# Patient Record
Sex: Female | Born: 1935 | Race: White | Hispanic: No | Smoking: Former smoker
Health system: Southern US, Community
[De-identification: ages and names within clinical notes are randomized; demographics above are authoritative.]

## PROBLEM LIST (undated history)

## (undated) DIAGNOSIS — I350 Nonrheumatic aortic (valve) stenosis: Secondary | ICD-10-CM

## (undated) DIAGNOSIS — I351 Nonrheumatic aortic (valve) insufficiency: Secondary | ICD-10-CM

## (undated) DIAGNOSIS — J9610 Chronic respiratory failure, unspecified whether with hypoxia or hypercapnia: Secondary | ICD-10-CM

## (undated) DIAGNOSIS — M1990 Unspecified osteoarthritis, unspecified site: Secondary | ICD-10-CM

## (undated) DIAGNOSIS — I491 Atrial premature depolarization: Secondary | ICD-10-CM

## (undated) DIAGNOSIS — T8859XA Other complications of anesthesia, initial encounter: Secondary | ICD-10-CM

## (undated) DIAGNOSIS — Z8719 Personal history of other diseases of the digestive system: Secondary | ICD-10-CM

## (undated) DIAGNOSIS — T4145XA Adverse effect of unspecified anesthetic, initial encounter: Secondary | ICD-10-CM

## (undated) DIAGNOSIS — K5790 Diverticulosis of intestine, part unspecified, without perforation or abscess without bleeding: Secondary | ICD-10-CM

## (undated) DIAGNOSIS — I5032 Chronic diastolic (congestive) heart failure: Secondary | ICD-10-CM

## (undated) DIAGNOSIS — E079 Disorder of thyroid, unspecified: Secondary | ICD-10-CM

## (undated) DIAGNOSIS — Z8619 Personal history of other infectious and parasitic diseases: Secondary | ICD-10-CM

## (undated) DIAGNOSIS — K219 Gastro-esophageal reflux disease without esophagitis: Secondary | ICD-10-CM

## (undated) DIAGNOSIS — K2970 Gastritis, unspecified, without bleeding: Secondary | ICD-10-CM

## (undated) DIAGNOSIS — I77810 Thoracic aortic ectasia: Secondary | ICD-10-CM

## (undated) DIAGNOSIS — D860 Sarcoidosis of lung: Secondary | ICD-10-CM

## (undated) DIAGNOSIS — D869 Sarcoidosis, unspecified: Secondary | ICD-10-CM

## (undated) DIAGNOSIS — J45909 Unspecified asthma, uncomplicated: Secondary | ICD-10-CM

## (undated) DIAGNOSIS — I252 Old myocardial infarction: Secondary | ICD-10-CM

## (undated) DIAGNOSIS — I340 Nonrheumatic mitral (valve) insufficiency: Secondary | ICD-10-CM

## (undated) DIAGNOSIS — M810 Age-related osteoporosis without current pathological fracture: Secondary | ICD-10-CM

## (undated) DIAGNOSIS — R002 Palpitations: Secondary | ICD-10-CM

## (undated) DIAGNOSIS — K449 Diaphragmatic hernia without obstruction or gangrene: Secondary | ICD-10-CM

## (undated) DIAGNOSIS — I272 Pulmonary hypertension, unspecified: Secondary | ICD-10-CM

## (undated) DIAGNOSIS — B9681 Helicobacter pylori [H. pylori] as the cause of diseases classified elsewhere: Secondary | ICD-10-CM

## (undated) DIAGNOSIS — J449 Chronic obstructive pulmonary disease, unspecified: Secondary | ICD-10-CM

## (undated) DIAGNOSIS — Z9289 Personal history of other medical treatment: Secondary | ICD-10-CM

## (undated) DIAGNOSIS — K225 Diverticulum of esophagus, acquired: Secondary | ICD-10-CM

## (undated) DIAGNOSIS — I480 Paroxysmal atrial fibrillation: Secondary | ICD-10-CM

## (undated) HISTORY — DX: Diverticulosis of intestine, part unspecified, without perforation or abscess without bleeding: K57.90

## (undated) HISTORY — DX: Personal history of other infectious and parasitic diseases: Z86.19

## (undated) HISTORY — DX: Gastritis, unspecified, without bleeding: K29.70

## (undated) HISTORY — PX: HERNIA REPAIR: SHX51

## (undated) HISTORY — DX: Sarcoidosis of lung: D86.0

## (undated) HISTORY — PX: COLONOSCOPY: SHX5424

## (undated) HISTORY — DX: Paroxysmal atrial fibrillation: I48.0

## (undated) HISTORY — DX: Personal history of other medical treatment: Z92.89

## (undated) HISTORY — DX: Unspecified asthma, uncomplicated: J45.909

## (undated) HISTORY — DX: Diaphragmatic hernia without obstruction or gangrene: K44.9

## (undated) HISTORY — DX: Helicobacter pylori (H. pylori) as the cause of diseases classified elsewhere: B96.81

## (undated) HISTORY — DX: Palpitations: R00.2

## (undated) HISTORY — DX: Unspecified osteoarthritis, unspecified site: M19.90

## (undated) HISTORY — DX: Personal history of other diseases of the digestive system: Z87.19

## (undated) HISTORY — DX: Chronic diastolic (congestive) heart failure: I50.32

## (undated) HISTORY — DX: Nonrheumatic aortic (valve) insufficiency: I35.1

## (undated) HISTORY — PX: CATARACT EXTRACTION: SUR2

## (undated) HISTORY — DX: Diverticulum of esophagus, acquired: K22.5

## (undated) HISTORY — DX: Gastro-esophageal reflux disease without esophagitis: K21.9

## (undated) HISTORY — PX: THYROIDECTOMY: SHX17

## (undated) HISTORY — DX: Atrial premature depolarization: I49.1

## (undated) HISTORY — PX: LEG SURGERY: SHX1003

## (undated) HISTORY — DX: Thoracic aortic ectasia: I77.810

---

## 1996-04-24 HISTORY — PX: CHOLECYSTECTOMY: SHX55

## 1997-10-24 HISTORY — PX: ABDOMINAL HYSTERECTOMY: SUR658

## 1998-01-24 HISTORY — PX: OTHER SURGICAL HISTORY: SHX169

## 1998-12-16 ENCOUNTER — Inpatient Hospital Stay (HOSPITAL_COMMUNITY): Admission: RE | Admit: 1998-12-16 | Discharge: 1998-12-18 | Payer: Self-pay | Admitting: Cardiovascular Disease

## 1998-12-17 ENCOUNTER — Encounter: Payer: Self-pay | Admitting: Cardiology

## 2006-12-14 ENCOUNTER — Ambulatory Visit: Payer: Self-pay | Admitting: Cardiology

## 2007-01-04 ENCOUNTER — Encounter: Payer: Self-pay | Admitting: Cardiology

## 2007-01-04 ENCOUNTER — Ambulatory Visit: Payer: Self-pay | Admitting: Cardiology

## 2008-03-12 ENCOUNTER — Ambulatory Visit: Payer: Self-pay | Admitting: Cardiology

## 2008-09-01 LAB — PULMONARY FUNCTION TEST

## 2008-11-13 ENCOUNTER — Encounter: Payer: Self-pay | Admitting: Cardiology

## 2009-01-26 DIAGNOSIS — I4892 Unspecified atrial flutter: Secondary | ICD-10-CM

## 2009-01-26 DIAGNOSIS — R002 Palpitations: Secondary | ICD-10-CM

## 2009-01-26 DIAGNOSIS — I080 Rheumatic disorders of both mitral and aortic valves: Secondary | ICD-10-CM

## 2009-01-27 ENCOUNTER — Encounter: Payer: Self-pay | Admitting: Cardiology

## 2009-02-23 ENCOUNTER — Ambulatory Visit: Payer: Self-pay | Admitting: Cardiology

## 2009-02-23 DIAGNOSIS — R42 Dizziness and giddiness: Secondary | ICD-10-CM

## 2010-02-25 NOTE — Letter (Signed)
Summary: Appointment -missed  [facility] at [facility] [address]   [place], [place] [address]   Phone: [phone]  Fax: [phone]     January 27, 2009 MRN: [id number]     [facility] [address] [place], [place]  [address]     Dear Ms. Muniz,  Our records indicate you missed your appointment on January 27, 2009                        with Dr. Andee Lineman.   It is very important that we reach you to reschedule this appointment. We look forward to participating in your health care needs.   Please contact us at the number listed above at your earliest convenience to reschedule this appointment.   Sincerely,    Glass blower/designer

## 2010-02-25 NOTE — Assessment & Plan Note (Signed)
Summary: 6 mo fu r/sfrom missed appt  Medications Added PROVENTIL HFA 108 (90 BASE) MCG/ACT AERS (ALBUTEROL SULFATE) use as directed      Allergies Added: ! ASPIRIN ! * NARCOTICS ! PCN  Visit Type:  Follow-up Primary Provider:  [name]  CC:  follow-up visit.  History of Present Illness: the patient is a 77 year old female with history of thyroidectomy and sarcoidosis.  Previously she had a negative ischemia workup.  She has a history of partial abdominal pain which has improved with dicyclomine.  She reports no chest pain shortness of breath orthopnea PND.  She had a negative Cardiolite in 2000.  She does not report any cardiovascular symptoms.  She denies chest pain orthopnea PND.  She does report occasional dizzy spells consistent with vertigo.  She feels she has an inner ear problem.  Clinical Review Panels:  Echocardiogram Echocardiogram Transthoracic Echocardiogram            Conclusions:         1. Global left ventricular wall motion and contractility are within         normal limits.         2. The estimated ejection fraction is 60-65%.          3. The right ventricle is slightly dilated.                              Electronically signed at 01/04/2007 18:07:52 by: Zackery Barefoot, M.D. (01/04/2007)    Preventive Screening-Counseling & Management  Alcohol-Tobacco     Smoking Status: quit     Year Quit: 1970  Current Medications (verified): 1)  Synthroid 112 Mcg Tabs (Levothyroxine Sodium) .... Take 1 Tablet By Mouth Once A Day 2)  Zantac 150 Mg Caps (Ranitidine Hcl) .... Take 1 Tablet By Mouth Two Times A Day 3)  Dicyclomine Hcl 20 Mg Tabs (Dicyclomine Hcl) .... Take 1 Tablet By Mouth Three Times A Day As Needed 4)  Proventil Hfa 108 (90 Base) Mcg/act Aers (Albuterol Sulfate) .... Use As Directed  Allergies (verified): 1)  ! Aspirin 2)  ! * Narcotics 3)  ! Pcn  Comments:  Nurse/Medical Assistant: The patient's medications and allergies were reviewed with  the patient and were updated in the Medication and Allergy Lists. Verbally gave names.  Past History:  Past Medical History: Last updated: February 24, 2009 Palpitations, resolved. Abdominal pain, likely spastic colon.  Normal colonoscopy.  History of lung sarcoidosis. Negative Cardiolite in 2000.   Family History: Last updated: Feb 24, 2009 Father: died from a stroke age 67 sister died and two other sisters with ? Problems with heart valves. Brother died with lung cancer and heart attack  Social History: Last updated: 24-Feb-2009 Tobacco Use - No.  Alcohol Use - no Drug Use - no Patient lives in [place]  Risk Factors: Smoking Status: quit (02/23/2009)  Social History: Smoking Status:  quit  Review of Systems       The patient complains of dizziness.  The patient denies fatigue, malaise, fever, weight gain/loss, vision loss, decreased hearing, hoarseness, chest pain, palpitations, shortness of breath, prolonged cough, wheezing, sleep apnea, coughing up blood, abdominal pain, blood in stool, nausea, vomiting, diarrhea, heartburn, incontinence, blood in urine, muscle weakness, joint pain, leg swelling, rash, skin lesions, headache, fainting, depression, anxiety, enlarged lymph nodes, easy bruising or bleeding, and environmental allergies.    Vital Signs:  Patient profile:   77 year old female Height:  64 inches Weight:      160 pounds BMI:     27.56 Pulse rate:   68 / minute BP sitting:   122 / 78  (left arm) Cuff size:   regular  Vitals Entered By: Carlye Grippe (February 23, 2009 10:08 AM)  Nutrition Counseling: Patient's BMI is greater than 25 and therefore counseled on weight management options. CC: follow-up visit   Physical Exam  Additional Exam:  General: Well-developed, well-nourished in no distress head: Normocephalic and atraumatic eyes PERRLA/EOMI intact, conjunctiva and lids normal nose: No deformity or lesions mouth normal dentition, normal posterior  pharynx neck: Supple, no JVD.  No masses, thyromegaly or abnormal cervical nodes lungs: Normal breath sounds bilaterally without wheezing.  Normal percussion heart: regular rate and rhythm with normal S1 and S2, no S3 or S4.  PMI is normal.  No pathological murmurs abdomen: Normal bowel sounds, abdomen is soft and nontender without masses, organomegaly or hernias noted.  No hepatosplenomegaly musculoskeletal: Back normal, normal gait muscle strength and tone normal pulsus: Pulse is normal in all 4 extremities Extremities:Non- pitting edema with varicosities.  neurologic: Alert and oriented x 3 skin: Intact without lesions or rashes cervical nodes: No significant adenopathy psychologic: Normal affect    EKG  Procedure date:  02/23/2009  Findings:      normal sinus rhythm with first degree AV block.  Left axis deviation nonspecific ST-T wave changes.  Impression & Recommendations:  Problem # 1:  PALPITATIONS (ICD-785.1) the patient reports no recurrent palpitations.  She can continue her current medical regimen.  Problem # 2:  ATRIAL FLUTTER (ICD-427.32) no evidence of atrial arrhythmia by ECG. Orders: EKG w/ Interpretation (93000)  Problem # 3:  INTERMITTENT VERTIGO (ICD-780.4) the patient will be referred for an ENT assessment by Dr. Andrey Campanile.  Patient Instructions: 1)  Your physician recommends that you continue on your current medications as directed. Please refer to the Current Medication list given to you today. 2)  Follow up in  1 year.

## 2010-03-04 ENCOUNTER — Ambulatory Visit (INDEPENDENT_AMBULATORY_CARE_PROVIDER_SITE_OTHER): Payer: Medicare PPO | Admitting: Cardiology

## 2010-03-04 ENCOUNTER — Encounter: Payer: Self-pay | Admitting: Cardiology

## 2010-03-04 DIAGNOSIS — R002 Palpitations: Secondary | ICD-10-CM

## 2010-03-11 NOTE — Assessment & Plan Note (Signed)
Summary: 1 YR FU/SRS  Medications Added CALTRATE 600+D 600-400 MG-UNIT TABS (CALCIUM CARBONATE-VITAMIN D) Take 1 tablet by mouth two times a day MULTIVITAMINS  TABS (MULTIPLE VITAMIN) Take 1 tablet by mouth once a day      Allergies Added:   Visit Type:  Follow-up Primary Provider:  [name]   History of Present Illness: the patient is a 77 year old female with a history of thyroidectomy and sarcoidosis.  the patient had a prior Cardiolite study which was negative for ischemia.  She does have a history of abdominal pain which has improved with dicyclomine.  She had a negative Cardiolite study in 2000.she presents for follow-up today.  She has been well.  She reports no chest pain palpitations or syncope.  Preventive Screening-Counseling & Management  Alcohol-Tobacco     Smoking Status: quit     Year Quit: 170  Current Medications (verified): 1)  Synthroid 112 Mcg Tabs (Levothyroxine Sodium) .... Take 1 Tablet By Mouth Once A Day 2)  Zantac 150 Mg Caps (Ranitidine Hcl) .... Take 1 Tablet By Mouth Two Times A Day 3)  Dicyclomine Hcl 20 Mg Tabs (Dicyclomine Hcl) .... Take 1 Tablet By Mouth Three Times A Day As Needed 4)  Proventil Hfa 108 (90 Base) Mcg/act Aers (Albuterol Sulfate) .... Use As Directed 5)  Caltrate 600+d 600-400 Mg-Unit Tabs (Calcium Carbonate-Vitamin D) .... Take 1 Tablet By Mouth Two Times A Day 6)  Multivitamins  Tabs (Multiple Vitamin) .... Take 1 Tablet By Mouth Once A Day  Allergies (verified): 1)  ! Aspirin 2)  ! * Narcotics 3)  ! Pcn  Comments:  Nurse/Medical Assistant: The patient's medications and allergies were verbally reviewed with the patient and were updated in the Medication and Allergy Lists.  Past History:  Past Medical History: Last updated: 01-Feb-2009 Palpitations, resolved. Abdominal pain, likely spastic colon.  Normal colonoscopy.  History of lung sarcoidosis. Negative Cardiolite in 2000.   Family History: Last updated:  2009-02-01 Father: died from a stroke age 44 sister died and two other sisters with ? Problems with heart valves. Brother died with lung cancer and heart attack  Social History: Last updated: February 01, 2009 Tobacco Use - No.  Alcohol Use - no Drug Use - no Patient lives in [place]  Risk Factors: Smoking Status: quit (03/04/2010)  Review of Systems  The patient denies fatigue, malaise, fever, weight gain/loss, vision loss, decreased hearing, hoarseness, chest pain, palpitations, shortness of breath, prolonged cough, wheezing, sleep apnea, coughing up blood, abdominal pain, blood in stool, nausea, vomiting, diarrhea, heartburn, incontinence, blood in urine, muscle weakness, joint pain, leg swelling, rash, skin lesions, headache, fainting, dizziness, depression, anxiety, enlarged lymph nodes, easy bruising or bleeding, and environmental allergies.    Vital Signs:  Patient profile:   77 year old female Height:      64 inches Weight:      170 pounds BMI:     29.29 Pulse rate:   67 / minute BP sitting:   166 / 86  (left arm) Cuff size:   regular  Vitals Entered By: Carlye Grippe (March 04, 2010 2:14 PM)  Nutrition Counseling: Patient's BMI is greater than 25 and therefore counseled on weight management options.  Serial Vital Signs/Assessments:  Time      Position  BP       Pulse  Resp  Temp     By 2:16 PM             163/75   71  Carlye Grippe   Physical Exam  Additional Exam:  General: Well-developed, well-nourished in no distress head: Normocephalic and atraumatic eyes PERRLA/EOMI intact, conjunctiva and lids normal nose: No deformity or lesions mouth normal dentition, normal posterior pharynx neck: Supple, no JVD.  No masses, thyromegaly or abnormal cervical nodes lungs: Normal breath sounds bilaterally without wheezing.  Normal percussion heart: regular rate and rhythm with normal S1 and S2, no S3 or S4.  PMI is normal.  No pathological  murmurs abdomen: Normal bowel sounds, abdomen is soft and nontender without masses, organomegaly or hernias noted.  No hepatosplenomegaly musculoskeletal: Back normal, normal gait muscle strength and tone normal pulsus: Pulse is normal in all 4 extremities Extremities:Non- pitting edema with varicosities.  neurologic: Alert and oriented x 3 skin: Intact without lesions or rashes cervical nodes: No significant adenopathy psychologic: Normal affect    Echocardiogram  Procedure date:  03/04/2010  Findings:       GE Vscan limited ECHO study was performed there was normal LV function.  Normal LV size as well as normal RV size and function.  Left atrial was within normal limits.  There was no significant left ventricular hypertrophy aortic valve is mildly calcified but there was normal cusp excursion there was trace to mild aortic insufficiency, mitral leaflets were thickened and there was mild mitral regurgitation.  EKG  Procedure date:  03/04/2010  Findings:      sinus rhythm with first degree AV block.  Left axis deviation premature ventricular contractions.  Heart rate 67 beats/min  Impression & Recommendations:  Problem # 1:  PALPITATIONS (ICD-785.1) the patient has no recurrent palpitations.  She has been doing well.  She remains under a lot of social stress.  She seems to be doing well however  Problem # 2:  ATRIAL FLUTTER (ICD-427.32) is no evidence of arrhythmia.  EKG is within normal limits with no acute changes. Orders: EKG w/ Interpretation (93000)  Problem # 3:  MITRAL VALVE INSUFF&AORTIC VALVE INSUFF (ICD-396.3) no evidence of significant valvular disease. GE Vscan limited ECHO study was performed at the bedside.  Results are as noted above.    Patient Instructions: 1)  Your physician recommends that you continue on your current medications as directed. Please refer to the Current Medication list given to you today. 2)  Follow up in  1 year

## 2010-06-08 NOTE — Assessment & Plan Note (Signed)
[facility]                          [facility] OFFICE NOTE   NAME:CHESHIRESheryle, Vice                       MRN:          [id number]  DATE:03/12/2008                            DOB:          Jul 09, 1928    HISTORY OF PRESENT ILLNESS:  The patient is a very pleasant 77 year old  female with a history of thyroidectomy and sarcoidosis.  She also has  hypothyroidism and is on thyroid replacement therapy.  She had a  negative Cardiolite in 2000.  She reports no chest pain, shortness of  breath, orthopnea, or PND.  An echocardiogram done last year also showed  normal LV function and no evidence of cardiac sarcoid.   From a cardiovascular standpoint, the patient is actually doing quite  well.  Her main problem, however, is that she has been suffering  abdominal pain for the last 3 months.  She states that this happens  almost on a daily basis.  There is no bloating associated with, no  constipation or diarrhea, but the pain can be quite severe.  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  This has caused some anxiety as well as difficulty sleeping.  This is  also around the same time when her abdominal symptoms started.  She did  have a colonoscopy done by Dr. Cleotis Nipper few months ago which was within  normal limits.   MEDICATIONS:  1. Flovent 44 mcg 2 puffs b.i.d.  2. Synthroid 0.112 mg p.o. daily.  3. Advair 1 puff daily.  4. Zantac 150 mg p.o. b.i.d.   PHYSICAL EXAMINATION:  VITAL SIGNS:  Blood pressure 146/89, the patient  reassures me that her home blood pressures actually are normal.  Heart  rate 68, weight 189 pounds.  NECK:  Normal carotid stroke and no carotid bruits.  LUNGS:  Clear breath sounds bilaterally.  HEART:  Regular rate and rhythm.  Normal S1 and S2.  No murmur, rubs, or  gallops.  ABDOMEN:  Soft, nontender.  No rebound or  guarding.  Good bowel sounds.  EXTREMITIES:  No cyanosis, clubbing, or edema.  NEUROLOGIC:  The patient is alert, oriented, and grossly nonfocal.   PROBLEM LIST:  1. Palpitations, resolved.  2. Abdominal pain, likely spastic colon.  3. Normal colonoscopy.  4. History of lung sarcoidosis.  5. Negative Cardiolite in 2000.   PLAN:  1. From a cardiovascular standpoint, the patient is doing quite well.      She did point out to me that she had some swelling in the lower      extremities, but she clearly has no pitting edema, and this appears      to be secondary to venous insufficiency.  2. The patient does appear to be under some stress and anxiety because      of her losses she suffered in the stock market.  I do think her      abdominal pain is associated with anxiety component.  In the  meanwhile, while she is awaiting an evaluation by Dr. Karilyn Cota, I      actually gave the patient a prescription of dicyclomine 20 mg p.o.      t.i.d. to be used p.r.n. for abdominal pain.  3. The patient can follow up with Dr. Maryellen Pile and Dr. Karilyn Cota regarding      possibilities and consideration for antianxiety medications if so      indicated.     Learta Codding, MD,FACC  Electronically Signed    GED/MedQ  DD: 03/12/2008  DT: 03/12/2008  Job #: [id number]   cc:   [name]

## 2010-06-08 NOTE — Assessment & Plan Note (Signed)
**Kari [name] De-Identified via Obfuscation** [facility]                          Kari Kari [name]   NAME:CHESHIREIkia, Kari Kari [name]                       MRN:          [id number]  DATE:12/14/2006                            DOB:          April 27, 1928    This is a new patient Kari [name].   REASON FOR CONSULTATION:  Evaluation of palpitations.   HISTORY OF PRESENT ILLNESS:  The patient is a 77 year old female with a  history of prior palpitations and substernal chest pain.  The patient  was evaluated by our group in 2000, when she underwent a stress test  which was reportedly within normal limits.  The patient in 2003, also  underwent an evaluation which I suspect was a [employer] with questionable  heart failure.  The patient now is seen due to a history of  approximately one month ago of palpitations.  Interestingly, this  occurred shortly after she was prescribed Advair Diskus when she saw a  physician at [facility] for her underlying lung sarcoidosis.  The patient  in the interim has stopped this medication and has experienced no  further palpitations.  She also reports no substernal chest pain,  shortness of breath, orthopnea, PND.  The patient is rather active.  She  does report chronic lower extremity edema of a non-pitting variety.  The  patient has a history of sarcoidosis and has been treated in the past  with steroids.  She is followed at [facility] for this.  She reports no  exertional chest pain or exertional dyspnea.  Her EKG in the office  today is essentially within normal limits.   PAST MEDICAL HISTORY:  1. History of thyroidectomy.  2. Hysterectomy.  3. Hernia repair.  4. Cholecystectomy.  5. Tonsillectomy.  6. History of sarcoidosis.  7. History of hypothyroidism.   ALLERGIES:  No known drug allergies.   MEDICATIONS:  1. Synthroid 112 mcg p.o. daily.  2. Flovent inhaler 2 puffs b.i.d.   SOCIAL HISTORY:  The patient lives in [place].  She does not smoke or  drink.  She teaches bible  study.   FAMILY HISTORY:  Mother is alive and has atrial fibrillation, she is 60  years old.  Father died from a stroke at age 9.  One sister is deceased  and two other sisters with questionable problems with heart valves.  She has a brother who had lung cancer and a heart attack.   REVIEW OF SYSTEMS:  As per HPI, no nausea, vomiting, no fever, chills,  no melena, hematochezia, dysuria, frequency, no recurrent palpitations  or syncope, and no myalgias or arthralgias.  The remainder of the review  of systems is within normal limits.   PHYSICAL EXAMINATION:  VITAL SIGNS:  Blood pressure is 133/70, heart  rate is 88 beats per minute, weight is 179 pounds.  NECK:  Normal carotid upstroke and no carotid bruits.  LUNGS:  Clear breath sounds bilaterally.  HEART:  Regular rate and rhythm.  Normal S1 S2.  No murmur, rub, or  gallop.  ABDOMEN:  Soft, nontender.  No rebound or guarding.  Good bowel sounds.  EXTREMITIES:  No  cyanosis or clubbing.  There is peripheral edema but  non-pitting.  NEUROLOGIC:  The patient is alert, oriented, and grossly nonfocal.   EKG:  Normal sinus rhythm with no acute ischemic changes.   PROBLEM LIST:  1. Palpitations, resolved.      a.     Post Advair use.      b.     No prior history of arrhythmias.      c.     Rule out cardiac sarcoid.  2. History of lung sarcoidosis.  3. History of chest pain with negative Cardiolite in 2000.   PLAN:  1. I do not think the patient needs a CardioNet monitor at the present      time as her palpitations have resolved after Advair Diskus use.      The patient __________  inhaled steroid agent.  2. Given her history of sarcoidosis, I will proceed with an      echocardiographic study to make sure the patient does not have      cardiac sarcoid or cardiomyopathy associated with this diagnosis.  3. The patient can follow up with [place] in 6 months.  I have not      prescribed any medications presently.  We will await the results  of      the echocardiographic study.     Learta Codding, MD,FACC  Electronically Signed    GED/MedQ  DD: 12/15/2006  DT: 12/15/2006  Job #: [id number]   cc:   [name]

## 2010-06-11 NOTE — Discharge Summary (Signed)
[place]. [facility]  Patient:    Kari [name]                        MRN: [id number] Adm. Date:  [id number] Disc. Date: 12/18/98 Attending:  Colon Branch Dictator:   Delton See, P.A. CC:         [facility], [address], [place], [place]             [place]. Maryellen Pile, M.D., [address] [place], [place]  2             Nathen May, M.D. [facility]                           Discharge Summary  HISTORY ON ADMISSION:  Kari [name] is a very pleasant 77 year old female who was seen in the office on December 16, 1998 by Dr. Corinda Gubler following a syncopal episode.  She apparently was accompanying her uncle to an appointment.  She stated she felt like she needed to get some air.  She stood up to leave the room; she collapsed on the floor.  The episode was witnessed by Arlana Hove, C.M.A. The patient apparently did not lose consciousness.  Her skin was warm and dry.  She  denied chest pain.  She complained of some nausea.  Blood pressure was 140/96, pulse 96.  The patient denies any history of diabetes.  She appeared somewhat dazed.  It was not clear at that time whether she had ever had syncopal episodes in the past.  An EKG was obtained and the patient denied any injury.  She was admitted to the hospital for further evaluation.  PAST MEDICAL HISTORY:  The patient is status post tonsillectomy, thyroidectomy,  cholecystectomy, hysterectomy, hernia repair x 2, history of right leg fracture, history of osteoporosis, history of asthma.  There is no history of hypertension, diabetes or tobacco use.  There is a positive family history for coronary artery disease.  MEDICATIONS PRIOR TO ADMISSION:  Mysoline spray, estrogen patch, Synthroid, Flovent and Proventil metered-dose inhalers.  HOSPITAL COURSE:  As noted, this patient was admitted to [facility] after having a syncopal episode in the office.  A 2-D echo  was performed on December 16, 1998; this showed normal left ventricular size and contraction. There was delayed relaxation.  There was mild left atrial enlargement, moderate mitral regurgitation, mild AI and mild TR.  A spiral CT scan was performed that was negative for pulmonary embolus.  On December 18, 1998, the patient had an exercise Cardiolite stress test; the patient exercised for a total of three minutes.  She reached her target heart rate at two minutes into the study.  Her heart rate was 130 beats per minute; the target was 129 beats per minute.  The patient did develop chest tightness and shortness of breath during exercise.  At three minutes, her heart rate was 136.  She was unable to go any further; however, this completed the study.  She did have ST segment depression in leads V3 through V6 of 1.5 to 2.0 mm, which was downsloping. This persisted into recovery and eventually resolved.  The final Cardiolite images showed normal wall motion with an EF of 70% with inferior attenuation, likely diaphragmatic.  No reversible ischemia.  This was interpreted as a negative study.  The situation was discussed with Gerrit Friends. Dietrich Pates, M.D. and  arrangements were made to discharge the patient with followup with Nathen May, M.D. in [place], [facility].  DISCHARGE MEDICATIONS:  The patient was told to continue her same medications she was on at time of admission; please see list as noted above.  ACTIVITY:  Activity was to be as tolerated.  The patient was told not to drive until cleared by her cardiologist.  DIET:  She was to be on her regular diet, which was a low-cholesterol diet.  FOLLOWUP:  She was told to call the [facility] on Monday to schedule an event recorder and an appointment with Dr. Graciela Husbands in approximately five weeks. he was to follow up with Cornell Barman. Maryellen Pile, M.D. as scheduled or as needed.  PROBLEM LIST AT TIME OF DISCHARGE:  1. Syncope with  negative cardiac enzymes.  2. Negative spiral CT for pulmonary embolus.  3. Exercise Cardiolite, December 18, 1998, with electrocardiogram changes but     negative scan; ejection fraction 70%.  4. Two-dimensional echocardiogram, December 16, 1998, revealing mild-to-moderate     valvular abnormalities, as noted above, with mild left atrial enlargement.  5. Positive family history of coronary artery disease.  6. Status post multiple surgeries.  7. Strong history of anxiety and panic attacks.  8. History of osteoporosis.  9. History of asthma. 10. Hypothyroidism, treated.  ADDENDUM:  A CBC on December 16, 1998 was within normal limits.  Cardiac enzymes were negative.  A PT and PTT were within normal limits.  A comprehensive metabolic panel was within normal limits.  A urinalysis was negative.  A TSH was 3.054. DD:  12/18/98 TD:  12/18/98 Job: [id number]

## 2010-06-11 NOTE — Discharge Summary (Signed)
[place]. [facility]  Patient:    Kari [name]                        MRN: [id number] Adm. Date:  [id number] Disc. Date: 12/18/98 Attending:  Colon Branch Dictator:   Delton See, P.A.                           Discharge Summary  ADDENDUM:  It was noted that the patient does have a history of an IODINE allergy. She was given prednisone, Benadryl, and cimetidine prophylaxis prior to her spiral CT scan with contrast.  Apparently she had no significant reaction to the study, although it should be noted that she is allergic to IODINE. DD:  12/18/98 TD:  12/18/98 Job: [id number]

## 2011-08-23 ENCOUNTER — Telehealth: Payer: Self-pay | Admitting: Cardiology

## 2011-08-23 NOTE — Telephone Encounter (Signed)
Spoke with patient and she will see Michelle L. PA tomorrow as scheduled. Patients telephone was not working well and it was going in and out, very difficult to have conversation.

## 2011-08-23 NOTE — Telephone Encounter (Signed)
New msg Pt called and said has rapid heart rate and is scheduled for Kari [name] tomorrow in [place]. She said she would prefer to see a MD instead. Please call her back

## 2011-08-24 ENCOUNTER — Ambulatory Visit: Payer: Medicare PPO | Admitting: Physician Assistant

## 2011-08-30 ENCOUNTER — Ambulatory Visit (INDEPENDENT_AMBULATORY_CARE_PROVIDER_SITE_OTHER): Payer: Medicare PPO | Admitting: Cardiovascular Disease

## 2011-08-30 ENCOUNTER — Encounter (INDEPENDENT_AMBULATORY_CARE_PROVIDER_SITE_OTHER): Payer: Medicare PPO

## 2011-08-30 ENCOUNTER — Encounter: Payer: Self-pay | Admitting: Cardiovascular Disease

## 2011-08-30 VITALS — BP 155/79 | HR 72 | Wt 181.0 lb

## 2011-08-30 DIAGNOSIS — I4892 Unspecified atrial flutter: Secondary | ICD-10-CM

## 2011-08-30 DIAGNOSIS — R002 Palpitations: Secondary | ICD-10-CM

## 2011-08-30 DIAGNOSIS — I440 Atrioventricular block, first degree: Secondary | ICD-10-CM | POA: Insufficient documentation

## 2011-08-30 DIAGNOSIS — I080 Rheumatic disorders of both mitral and aortic valves: Secondary | ICD-10-CM

## 2011-08-30 NOTE — Assessment & Plan Note (Signed)
PR was 218 in 2008 on ECG  Signs of SSS.  No evidence of high grade AV block  Yearly ECG

## 2011-08-30 NOTE — Progress Notes (Signed)
Patient ID: Kari [name], female   DOB: 19-Oct-1928, 78 y.o.   MRN: [id number] The patient is a 78 year-old female patient of Dr Degent with a history of thyroidectomy and sarcoidosis. . She does have a history of abdominal pain which has improved with dicyclomine. She had a negative Cardiolite study in 2000. She had an episode of ? PAF at eye doctor in July after having a bad reaction to some drops.  Last Tuesday had palpitaitons for 3-4 hours at grocery store.  Had weakness but no syncope No chest pain.  Still with occasional flip flops but no prolonged palpitations  ROS: Denies fever, malais, weight loss, blurry vision, decreased visual acuity, cough, sputum, SOB, hemoptysis, pleuritic pain, palpitaitons, heartburn, abdominal pain, melena, lower extremity edema, claudication, or rash.  All other systems reviewed and negative  General: Affect appropriate Healthy:  appears stated age HEENT: normal Neck supple with no adenopathy JVP normal no bruits no thyromegaly Lungs clear with no wheezing and good diaphragmatic motion Heart:  S1/S2 no murmur, no rub, gallop or click PMI normal Abdomen: benighn, BS positve, no tenderness, no AAA no bruit.  No HSM or HJR Distal pulses intact with no bruits No edema Neuro non-focal Skin warm and dry No muscular weakness   Current Outpatient Prescriptions  Medication Sig Dispense Refill  . Albuterol Sulfate (PROVENTIL HFA IN) Inhale 180 mcg into the lungs as directed.      . Calcium Carbonate-Vitamin D (CALTRATE 600+D) 600-400 MG-UNIT per tablet Take 1 tablet by mouth daily.      Marland Kitchen levothyroxine (SYNTHROID, LEVOTHROID) 112 MCG tablet Take 112 mcg by mouth daily.      . multivitamin (THERAGRAN) per tablet Take 1 tablet by mouth daily.      . ranitidine (ZANTAC) 150 MG tablet Take 150 mg by mouth as needed.         Allergies  Aspirin and Penicillins  Electrocardiogram:  NSR rate 72 PR 280 PAC  Assessment and Plan

## 2011-08-30 NOTE — Assessment & Plan Note (Signed)
Possbiel intermitant atrial arrhythmia.  Event monitor.  F/U with Dr Andee Lineman in [place].  Given porlonged PR would not empirically start anything unless flutter or fib seen on monitor.  No previous history of structural heart disease.

## 2011-08-30 NOTE — Assessment & Plan Note (Signed)
Echo 2008 ordered by GD with only mild MR and no prolapse No murmur on exam Observe

## 2011-08-30 NOTE — Patient Instructions (Signed)
Your physician recommends that you schedule a follow-up appointment in: 6-8 WEEKS WITH  DR Andee Lineman IN [facility] Your physician recommends that you continue on your current medications as directed. Please refer to the Current Medication list given to you today. Your physician has recommended that you wear an event monitor. Event monitors are medical devices that record the heart's electrical activity. Doctors most often [place] these monitors to diagnose arrhythmias. Arrhythmias are problems with the speed or rhythm of the heartbeat. The monitor is a small, portable device. You can wear one while you do your normal daily activities. This is usually used to diagnose what is causing palpitations/syncope (passing out).

## 2011-09-07 ENCOUNTER — Encounter: Payer: Self-pay | Admitting: Internal Medicine

## 2011-09-20 ENCOUNTER — Telehealth: Payer: Self-pay | Admitting: Pulmonary Disease

## 2011-09-20 NOTE — Telephone Encounter (Signed)
Called, spoke with pt.  I informed her of below.  She was seen by Dr. Kriste Basque for Pulmonary not PCP.  She already has a PCP.  She would like to know who Dr. Kriste Basque recs for a pulm dr for her in our office.  Pls advise.  ** She wanted to me thank Dr. Kriste Basque and give him her "best regards."

## 2011-09-20 NOTE — Telephone Encounter (Signed)
LMTCB

## 2011-09-20 NOTE — Telephone Encounter (Signed)
I spoke with pt and she stated she saw SN long time ago and wants to re-establish with him bc per pt "their is no one else like Dr. Kriste Basque". I do not see any visits in epic/emr. Please advise SN thanks

## 2011-09-20 NOTE — Telephone Encounter (Signed)
Per SN----so sorry---practice is slowing down and we are not taking on any new pts now.   We will be happy to help her find a primary care doctor that is close to her if she would like.  thanks

## 2011-09-20 NOTE — Telephone Encounter (Signed)
Per SN----best regards back to the pt.    Depends on the pulmonary problem and her primary care doctor will need to be the one to refer her to a pulmonary doctor. thanks

## 2011-09-21 NOTE — Telephone Encounter (Signed)
Called and spoke with patient informed her that per SN patient would need referral from PCP for a pulmonary doctor.  Patient verbalized understanding and stated that she would talk with her PCP regarding this.  Nothing further needed at this time.

## 2011-09-30 ENCOUNTER — Telehealth: Payer: Self-pay | Admitting: *Deleted

## 2011-09-30 NOTE — Telephone Encounter (Signed)
LMTCB RE MONITOR RE PER DR NISHAN NSR INFREQUENT PAC'S AND ARTIFACT./CY

## 2011-10-03 NOTE — Telephone Encounter (Signed)
PT  AWARE OF  MONITOR RESULTS ./CY 

## 2011-10-06 ENCOUNTER — Encounter: Payer: Self-pay | Admitting: Internal Medicine

## 2011-10-07 ENCOUNTER — Encounter: Payer: Self-pay | Admitting: Internal Medicine

## 2011-10-07 ENCOUNTER — Ambulatory Visit (INDEPENDENT_AMBULATORY_CARE_PROVIDER_SITE_OTHER): Payer: Medicare PPO | Admitting: Internal Medicine

## 2011-10-07 ENCOUNTER — Ambulatory Visit (INDEPENDENT_AMBULATORY_CARE_PROVIDER_SITE_OTHER): Payer: Medicare PPO | Admitting: Emergency Medicine

## 2011-10-07 ENCOUNTER — Encounter: Payer: Self-pay | Admitting: Emergency Medicine

## 2011-10-07 VITALS — BP 130/60 | HR 88 | Ht 62.75 in | Wt 179.5 lb

## 2011-10-07 VITALS — BP 130/70 | HR 71 | Temp 97.8°F | Ht 62.75 in | Wt 180.4 lb

## 2011-10-07 DIAGNOSIS — K219 Gastro-esophageal reflux disease without esophagitis: Secondary | ICD-10-CM

## 2011-10-07 DIAGNOSIS — D869 Sarcoidosis, unspecified: Secondary | ICD-10-CM

## 2011-10-07 DIAGNOSIS — D860 Sarcoidosis of lung: Secondary | ICD-10-CM

## 2011-10-07 DIAGNOSIS — K449 Diaphragmatic hernia without obstruction or gangrene: Secondary | ICD-10-CM | POA: Insufficient documentation

## 2011-10-07 DIAGNOSIS — R1013 Epigastric pain: Secondary | ICD-10-CM

## 2011-10-07 DIAGNOSIS — J449 Chronic obstructive pulmonary disease, unspecified: Secondary | ICD-10-CM | POA: Insufficient documentation

## 2011-10-07 DIAGNOSIS — J45909 Unspecified asthma, uncomplicated: Secondary | ICD-10-CM

## 2011-10-07 DIAGNOSIS — J99 Respiratory disorders in diseases classified elsewhere: Secondary | ICD-10-CM

## 2011-10-07 MED ORDER — PANTOPRAZOLE SODIUM 40 MG PO TBEC: 40.0000 mg | DELAYED_RELEASE_TABLET | Freq: Every day | ORAL | Status: DC

## 2011-10-07 MED ORDER — BUDESONIDE-FORMOTEROL FUMARATE 160-4.5 MCG/ACT IN AERO: 2.0000 | INHALATION_SPRAY | Freq: Two times a day (BID) | RESPIRATORY_TRACT | Status: DC

## 2011-10-07 NOTE — Progress Notes (Signed)
Subjective:    Patient ID: Kari [name], female    DOB: 1928-05-29, 78 y.o.   MRN: [id number]  HPI 78 yo woman, former smoker 20 pk-yrs, hx childhood asthma, allergic rhinitis, A Fib, hiatal hernia seen by Dr Rhea Belton. She is a lifelong asthmatic, started to bother her more in her in the last 10 years. She was dx with sarcoidosis after bx of her skin at the [facility]. She has been found to have pulmonary nodules, never biopsied. Her most recent CT scan of the chest was in [place], [place]. She has had PFT at [facility]. Her breathing has been stable, has been taking Advair but she believes it is causing her LE pain, MSK pain.  Uses proventil prn.    Review of Systems  Constitutional: Negative for fever, chills, diaphoresis, activity change, appetite change, fatigue and unexpected weight change.  HENT: Negative for hearing loss, ear pain, nosebleeds, congestion, sore throat, facial swelling, rhinorrhea, sneezing, mouth sores, trouble swallowing, neck pain, neck stiffness, dental problem, voice change, postnasal drip, sinus pressure, tinnitus and ear discharge.   Eyes: Negative for photophobia, discharge, itching and visual disturbance.  Respiratory: Positive for shortness of breath. Negative for apnea, cough, choking, chest tightness, wheezing and stridor.   Cardiovascular: Positive for palpitations and leg swelling. Negative for chest pain.  Gastrointestinal: Negative for nausea, vomiting, abdominal pain, constipation, blood in stool and abdominal distention.  Genitourinary: Negative for dysuria, urgency, frequency, hematuria, flank pain, decreased urine volume and difficulty urinating.  Musculoskeletal: Negative for myalgias, back pain, joint swelling, arthralgias and gait problem.  Skin: Negative for color change, pallor and rash.  Neurological: Negative for dizziness, tremors, seizures, syncope, speech difficulty, weakness, light-headedness, numbness and headaches.  Hematological: Negative for  adenopathy. Does not bruise/bleed easily.  Psychiatric/Behavioral: Negative for confusion, disturbed wake/sleep cycle and agitation. The patient is not nervous/anxious.     Past Medical History  Diagnosis Date  . Palpitations   . Abdominal pain   . Sarcoidosis, lung   . Atrial fibrillation   . Arthritis   . Asthma   . History of gallstones   . History of scarlet fever      Family History  Problem Relation Age of Onset  . Diabetes Daughter   . Heart disease Mother   . Diabetes Father   . Diabetes Brother   . Other Sister     scarlet fever  . Rheumatic fever Sister      History   Social History  . Marital Status: Divorced    Spouse Name: N/A    Number of Children: 4  . Years of Education: N/A   Occupational History  . clergy    Social History Main Topics  . Smoking status: Former Smoker -- 2.0 packs/day for 10 years    Types: Cigarettes, Pipe  . Smokeless tobacco: Never Used   Comment: pt unsure of exact month in 1977  . Alcohol Use: Yes     seldom...glass wine  . Drug Use: No  . Sexually Active: Not on file   Other Topics Concern  . Not on file   Social History Narrative  . No narrative on file     Allergies  Allergen Reactions  . Aspirin   . Other     Narcotics   . Penicillins      Outpatient Prescriptions Prior to Visit  Medication Sig Dispense Refill  . Albuterol Sulfate (PROVENTIL HFA IN) Inhale 180 mcg into the lungs as directed.      Marland Kitchen  Calcium Carbonate-Vitamin D (CALTRATE 600+D) 600-400 MG-UNIT per tablet Take 1 tablet by mouth daily.      . fluticasone-salmeterol (ADVAIR HFA) 115-21 MCG/ACT inhaler Inhale 2 puffs into the lungs 2 (two) times daily.      Marland Kitchen levothyroxine (SYNTHROID, LEVOTHROID) 112 MCG tablet Take 112 mcg by mouth daily.      . multivitamin (THERAGRAN) per tablet Take 1 tablet by mouth daily.      . ranitidine (ZANTAC) 150 MG tablet Take 150 mg by mouth as needed.       . pantoprazole (PROTONIX) 40 MG tablet Take 1 tablet  (40 mg total) by mouth daily.  30 tablet  2       Objective:   Physical Exam Filed Vitals:   10/07/11 1613  BP: 130/70  Pulse: 71  Temp: 97.8 F (36.6 C)   Gen: Pleasant, elderly woman, in no distress,  A bit tangential  ENT: No lesions,  mouth clear,  oropharynx clear, no postnasal drip  Neck: No JVD, no TMG, no carotid bruits  Lungs: No use of accessory muscles,   Cardiovascular: RRR, heart sounds normal, no murmur or gallops, no peripheral edema  Musculoskeletal: No deformities, no cyanosis or clubbing  Neuro: alert, non focal  Skin: Warm, no lesions or rashes      Assessment & Plan:  No problem-specific assessment & plan notes found for this encounter.

## 2011-10-07 NOTE — Assessment & Plan Note (Signed)
-   will obtain her CT scan from [facility] and [address]

## 2011-10-07 NOTE — Patient Instructions (Addendum)
Stop Advair Start Symbicort 2 puffs twice a day until next visit We will obtain your CT scans and your breathing tests from [place] and [place].  Follow with Dr Delton Coombes in 6 weeks

## 2011-10-07 NOTE — Patient Instructions (Addendum)
We have sent the following medications to your pharmacy for you to pick up at your convenience: protonix, take 1 capsule daily. Use Zantac for any breakthrough heart burn you experience.  Follow up with Dr. Rhea Belton in 6 weeks

## 2011-10-07 NOTE — Progress Notes (Signed)
Patient ID: Kari [name], female   DOB: 1928/05/09, 78 y.o.   MRN: [id number]  SUBJECTIVE: HPI Kari [name] is an 78 yo female with PMH of pulmonary and dermatologic sarcoidosis, asthma, osteoarthritis, history of gallstones status post cholecystectomy, reflux disease with hiatus hernia, and atrial arrhythmia with palpitations who is seen in consultation from Kari [name] for evaluation of epigastric abdominal pain and to establish care.  She is alone today. She reports a previous GI history with Kari [name], from [facility]. He reports a prior history of an upper endoscopy performed approximately 2 years ago she was told she has a hiatal hernia with gastric inflammation. She recalls being treated for some time with Nexium, but discontinued this due to irritability.  She is currently continuing to have epigastric abdominal discomfort and occasionally a burning in her lower chest and left upper quadrant. She is occasionally having nausea but no vomiting. She reports her appetite has been good and she denies early satiety. She's had no unintentional weight loss. She does note some anosmia.  Bowel habits have been regular without bleeding or melena. She denies dysphagia or odynophagia.  She reports using Advair recently and this has caused some lower extremity aching pain. Due to this pain she has started taking naproxen twice a day.  Review of Systems  As per history of present illness, otherwise negative   Past Medical History  Diagnosis Date  . Palpitations   . Abdominal pain   . Sarcoidosis, lung   . Atrial fibrillation   . Arthritis   . Asthma   . History of gallstones     Current Outpatient Prescriptions  Medication Sig Dispense Refill  . Albuterol Sulfate (PROVENTIL HFA IN) Inhale 180 mcg into the lungs as directed.      . Calcium Carbonate-Vitamin D (CALTRATE 600+D) 600-400 MG-UNIT per tablet Take 1 tablet by mouth daily.      . fluticasone-salmeterol (ADVAIR HFA) 115-21  MCG/ACT inhaler Inhale 2 puffs into the lungs 2 (two) times daily.      Marland Kitchen levothyroxine (SYNTHROID, LEVOTHROID) 112 MCG tablet Take 112 mcg by mouth daily.      . multivitamin (THERAGRAN) per tablet Take 1 tablet by mouth daily.      . ranitidine (ZANTAC) 150 MG tablet Take 150 mg by mouth as needed.       . pantoprazole (PROTONIX) 40 MG tablet Take 1 tablet (40 mg total) by mouth daily.  30 tablet  2    Allergies  Allergen Reactions  . Aspirin   . Other     Narcotics   . Penicillins     Family History  Problem Relation Age of Onset  . Diabetes Daughter   . Heart disease Mother   . Diabetes Father   . Diabetes Brother   . Other Sister     scarlet fever    History  Substance Use Topics  . Smoking status: Former Games developer  . Smokeless tobacco: Never Used  . 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  She is good friends with another patient of ours, Kari [name]  OBJECTIVE: BP 130/60  Pulse 88  Ht 5' 2.75" (1.594 m)  Wt 179 lb 8 oz (81.421 kg)  BMI 32.05 kg/m2 Constitutional: Well-developed and well-nourished. No distress. HEENT: Normocephalic and atraumatic. Oropharynx is clear and moist. No oropharyngeal exudate. Conjunctivae are normal. Pupils are equal round and reactive to light. No scleral icterus. Neck:  Neck supple. Trachea midline. Cardiovascular: Normal rate, regular rhythm and intact distal pulses. No M/R/G Pulmonary/chest: Effort normal and breath sounds normal. No wheezing, rales or rhonchi. Abdominal: Soft, nontender, nondistended. Bowel sounds active throughout. There are no masses palpable. No hepatosplenomegaly. Extremities: no clubbing, cyanosis, or edema Lymphadenopathy: No cervical adenopathy noted. Neurological: Alert and oriented to person place and time. Skin: Skin is warm and dry. No rashes noted. Psychiatric: Normal mood and affect. Behavior is normal.  Records EMR reviewed, awaiting  requested records from outside hospitals  ASSESSMENT AND PLAN: 78 yo female with PMH of pulmonary and dermatologic sarcoidosis, asthma, osteoarthritis, history of gallstones status post cholecystectomy, reflux disease with hiatus hernia, and atrial arrhythmia with palpitations who is seen in consultation from Kari [name] for evaluation of epigastric abdominal pain and to establish care.  1. Epigastric pain/GERD -- I will wait records from her previous GI physician which should shed light on what has been done in the past. It sounds like she continues to have reflux symptoms, and certainly could have ongoing gastritis given her use of daily NSAIDs. I recommended a trial of pantoprazole 40 mg daily. We discussed how this will likely replace her Zantac, but she can continue to use this for breakthrough symptoms. I've asked that she notify us immediately if this medication causes increased irritability. I've advised her to take this 30 minutes before breakfast. I will see her back in 4-6 weeks to assess her response to this medication, and if symptoms persist on PPI, we'll discuss proceeding with endoscopy. Hopefully, we will have her old records by this time for completeness.  2. History of sarcoidosis and asthma -- she is requested a pulmonary referral, and we will place one to [facility]

## 2011-10-07 NOTE — Assessment & Plan Note (Signed)
Benefits from Advair but trouble tolerating due to side effects.  - will try a change to symbicort. If unable to take then consider just an ICS + SABA prn - will get her spirometry from [facility] - rov 6 weeks

## 2011-11-11 ENCOUNTER — Ambulatory Visit (INDEPENDENT_AMBULATORY_CARE_PROVIDER_SITE_OTHER): Payer: Medicare PPO | Admitting: Internal Medicine

## 2011-11-11 VITALS — BP 124/65 | HR 78 | Wt 180.5 lb

## 2011-11-11 DIAGNOSIS — R002 Palpitations: Secondary | ICD-10-CM

## 2011-11-11 NOTE — Patient Instructions (Addendum)
Your physician wants you to follow-up in: 6 months with Lori Gerhardt, NP and 12 months with Dr Allred You will receive a reminder letter in the mail two months in advance. If you don't receive a letter, please call our office to schedule the follow-up appointment.  

## 2011-11-13 ENCOUNTER — Encounter: Payer: Self-pay | Admitting: Internal Medicine

## 2011-11-13 NOTE — Assessment & Plan Note (Signed)
Recent event monitor reveals pacs Though she carries a diagnosis of atrial fibrillation, this is not well documented  No changes today  Follow-up with Kari [name] in 6 months I will see again in a year

## 2011-11-13 NOTE — Progress Notes (Signed)
PCP: Kari Nations, MD  Kari [name] is a 78 y.o. female who presents today for routine cardiology followup.  She recently presented to establish care with Dr Eden Emms.  At that time, she reported palpitations.  She had an event monitor which revealed pacs but no afib.  She has done well since that time. Today, she denies symptoms of  chest pain, shortness of breath,  lower extremity edema, dizziness, presyncope, or syncope.  The patient is otherwise without complaint today.   Past Medical History  Diagnosis Date  . Palpitations   . Abdominal pain   . Sarcoidosis, lung   . Atrial fibrillation   . Arthritis   . Asthma   . History of gallstones   . History of scarlet fever   . Premature atrial contractions    Past Surgical History  Procedure Date  . Colonoscopy   . Cardiolite 2000  . Cholecystectomy 04/1996  . Abdominal hysterectomy 10/1997  . Hernia repair     inguinal and hiatal/abdominal  . Leg surgery     right leg...rod/screws    Current Outpatient Prescriptions  Medication Sig Dispense Refill  . Albuterol Sulfate (PROVENTIL HFA IN) Inhale 180 mcg into the lungs as directed.      . Calcium Carbonate-Vitamin D (CALTRATE 600+D) 600-400 MG-UNIT per tablet Take 1 tablet by mouth daily.      Marland Kitchen levothyroxine (SYNTHROID, LEVOTHROID) 112 MCG tablet Take 112 mcg by mouth daily.      . multivitamin (THERAGRAN) per tablet Take 1 tablet by mouth daily.      . pantoprazole (PROTONIX) 20 MG tablet Take 20 mg by mouth daily.        Physical Exam: Filed Vitals:   11/11/11 1448  BP: 124/65  Pulse: 78  Weight: 180 lb 8 oz (81.874 kg)    GEN- The patient is well appearing, alert and oriented x 3 today.   Head- normocephalic, atraumatic Eyes-  Sclera clear, conjunctiva pink Ears- hearing intact Oropharynx- clear Lungs- Clear to ausculation bilaterally, normal work of breathing Heart- Regular rate and rhythm, no murmurs, rubs or gallops, PMI not laterally displaced GI- soft, NT, ND, +  BS Extremities- no clubbing, cyanosis, or edema   Assessment and Plan:

## 2011-11-21 ENCOUNTER — Encounter: Payer: Self-pay | Admitting: Emergency Medicine

## 2011-11-21 ENCOUNTER — Ambulatory Visit (INDEPENDENT_AMBULATORY_CARE_PROVIDER_SITE_OTHER): Payer: Medicare PPO | Admitting: Emergency Medicine

## 2011-11-21 ENCOUNTER — Ambulatory Visit (INDEPENDENT_AMBULATORY_CARE_PROVIDER_SITE_OTHER)
Admission: RE | Admit: 2011-11-21 | Discharge: 2011-11-21 | Disposition: A | Discharge status: Home / Self Care | Payer: Medicare PPO | Source: Ambulatory Visit | Attending: Emergency Medicine | Admitting: Emergency Medicine

## 2011-11-21 VITALS — BP 120/76 | HR 80 | Temp 98.0°F | Ht 64.0 in | Wt 181.0 lb

## 2011-11-21 DIAGNOSIS — D869 Sarcoidosis, unspecified: Secondary | ICD-10-CM

## 2011-11-21 DIAGNOSIS — J45909 Unspecified asthma, uncomplicated: Secondary | ICD-10-CM

## 2011-11-21 DIAGNOSIS — D860 Sarcoidosis of lung: Secondary | ICD-10-CM

## 2011-11-21 DIAGNOSIS — J99 Respiratory disorders in diseases classified elsewhere: Secondary | ICD-10-CM

## 2011-11-21 MED ORDER — BECLOMETHASONE DIPROPIONATE 80 MCG/ACT IN AERS: 1.0000 | INHALATION_SPRAY | Freq: Two times a day (BID) | RESPIRATORY_TRACT | Status: DC

## 2011-11-21 MED ORDER — AEROCHAMBER MINI CHAMBER DEVI: 1.0000 | Freq: Every day | Status: DC

## 2011-11-21 NOTE — Assessment & Plan Note (Signed)
Side effects from LABA (Advair and Symbicort) - will try QVAR 1 puff bid + SABA prn - rov 2 months

## 2011-11-21 NOTE — Progress Notes (Signed)
  Subjective:    Patient ID: Kari [name], female    DOB: 1928/04/14, 78 y.o.   MRN: [id number]  HPI 78 yo woman, former smoker 20 pk-yrs, hx childhood asthma, allergic rhinitis, A Fib, hiatal hernia seen by Dr Rhea Belton. She is a lifelong asthmatic, started to bother her more in her in the last 10 years. She was dx with sarcoidosis after bx of her skin [facility]. She has been found to have pulmonary nodules, never biopsied. Her most recent CT scan of the chest was in [place], [place]. She has had PFT at [facility]. Her breathing has been stable, has been taking Advair but she believes it is causing her LE pain, MSK pain.  Uses proventil prn.   ROV 11/21/11 -- 83 with hx longstanding asthma and sarcoidosis dx by skin bx. Seen recently in cardiology clinic for palpitations - ? Hx A fib, but documented PVC's. We stopped Advair last visit due to side effects, changed to symbicort. She was unable to use the Symbicort due to visual changes. She reports that she has been having nasal congestion and cough for the last week - ? Allergies, no known sick contacts. She has been on doxycycline 1 week. She has been using SABA about 1-2x a day. Overall she believes her breathing is improved.   CT scan chest 10/14/09 [facility], [place]) >> hyperinflation, hiatal hernia, sub-cm pretracheal nodes, no obvious nodules.      Objective:   Physical Exam Filed Vitals:   11/21/11 1011  BP: 120/76  Pulse: 80  Temp: 98 F (36.7 C)   Gen: Pleasant, elderly woman, in no distress,  A bit tangential  ENT: No lesions,  mouth clear,  oropharynx clear, no postnasal drip  Neck: No JVD, no TMG, no carotid bruits  Lungs: No use of accessory muscles, no wheezes  Cardiovascular: RRR, heart sounds normal, no murmur or gallops, no peripheral edema  Musculoskeletal: No deformities, no cyanosis or clubbing  Neuro: alert, non focal  Skin: Warm, no lesions or rashes      Assessment & Plan:  Sarcoidosis of lung CT scan in  09/2009 without any significant nodular disease or LAD. Will defer repeat CT at this time, follow CXR - CXR today  Asthma Side effects from LABA (Advair and Symbicort) - will try QVAR 1 puff bid + SABA prn - rov 2 months

## 2011-11-21 NOTE — Assessment & Plan Note (Signed)
CT scan in 09/2009 without any significant nodular disease or LAD. Will defer repeat CT at this time, follow CXR - CXR today

## 2011-11-21 NOTE — Patient Instructions (Addendum)
Stop Symbicort  Start using QVAR , 1 puff twice a day Use your albuterol as needed through a spacer CXR today Keep track of how often you need to use your albuterol Follow with Dr Delton Coombes in 2 months to review your status

## 2011-11-25 DIAGNOSIS — K2970 Gastritis, unspecified, without bleeding: Secondary | ICD-10-CM

## 2011-11-25 DIAGNOSIS — B9681 Helicobacter pylori [H. pylori] as the cause of diseases classified elsewhere: Secondary | ICD-10-CM

## 2011-11-25 HISTORY — DX: Gastritis, unspecified, without bleeding: K29.70

## 2011-11-25 HISTORY — DX: Helicobacter pylori (H. pylori) as the cause of diseases classified elsewhere: B96.81

## 2011-11-28 ENCOUNTER — Encounter: Payer: Self-pay | Admitting: Internal Medicine

## 2011-11-29 ENCOUNTER — Ambulatory Visit (INDEPENDENT_AMBULATORY_CARE_PROVIDER_SITE_OTHER): Payer: Medicare PPO | Admitting: Internal Medicine

## 2011-11-29 ENCOUNTER — Other Ambulatory Visit (INDEPENDENT_AMBULATORY_CARE_PROVIDER_SITE_OTHER): Payer: Medicare PPO

## 2011-11-29 ENCOUNTER — Telehealth: Payer: Self-pay | Admitting: Gastroenterology

## 2011-11-29 ENCOUNTER — Encounter: Payer: Self-pay | Admitting: Internal Medicine

## 2011-11-29 VITALS — BP 128/64 | HR 91 | Ht 64.0 in | Wt 179.0 lb

## 2011-11-29 DIAGNOSIS — R110 Nausea: Secondary | ICD-10-CM

## 2011-11-29 DIAGNOSIS — R1013 Epigastric pain: Secondary | ICD-10-CM

## 2011-11-29 DIAGNOSIS — K5900 Constipation, unspecified: Secondary | ICD-10-CM

## 2011-11-29 DIAGNOSIS — R109 Unspecified abdominal pain: Secondary | ICD-10-CM

## 2011-11-29 LAB — COMPREHENSIVE METABOLIC PANEL
Albumin: 4 g/dL (ref 3.5–5.2)
BUN: 19 mg/dL (ref 6–23)
CO2: 28 mEq/L (ref 19–32)
Calcium: 9.1 mg/dL (ref 8.4–10.5)
Chloride: 96 mEq/L (ref 96–112)
Glucose, Bld: 92 mg/dL (ref 70–99)
Potassium: 4.5 mEq/L (ref 3.5–5.1)
Sodium: 131 mEq/L — ABNORMAL LOW (ref 135–145)
Total Protein: 7.1 g/dL (ref 6.0–8.3)

## 2011-11-29 LAB — CBC
MCV: 92.3 fl (ref 78.0–100.0)
Platelets: 236 10*3/uL (ref 150.0–400.0)
RBC: 4.04 Mil/uL (ref 3.87–5.11)
WBC: 3.8 10*3/uL — ABNORMAL LOW (ref 4.5–10.5)

## 2011-11-29 LAB — LIPASE: Lipase: 21 U/L (ref 11.0–59.0)

## 2011-11-29 MED ORDER — ALIGN PO CAPS: 1.0000 | ORAL_CAPSULE | Freq: Every day | ORAL | Status: DC

## 2011-11-29 NOTE — Patient Instructions (Addendum)
You have been scheduled for an endoscopy with propofol. Please follow written instructions given to you at your visit today. If you use inhalers (even only as needed) or a CPAP machine, please bring them with you on the day of your procedure.  Your physician has requested that you go to the basement for lab work before leaving today:  You have been given samples of Align, take one capsule daily

## 2011-11-29 NOTE — Progress Notes (Addendum)
Subjective:    Patient ID: Kari [name], female    DOB: [birth year], 78 y.o.   MRN: [id number]  HPI Ms. Majewski is an 78 yo female with PMH of pulmonary and dermatologic sarcoidosis, asthma, osteoarthritis, history of gallstones status post cholecystectomy, reflux disease with hiatus hernia, and atrial arrhythmia with palpitations who is seen in follow-up. I last saw her in September 2013 for evaluation of epigastric abdominal pain. We started her on pantoprazole 40 mg daily after that appointment. She did not get much benefit from this medication and in fact found it to be very constipating. She recently stopped this medicine due to to ongoing constipation that was no longer responding to milk of magnesia.  She has continued to have epigastric abdominal pain which is worse after eating. It usually occurs within 20 minutes after eating. She is avoiding certain foods, specifically spicy foods which seems to help a little. She continues to have episodes of nausea but not vomiting. She was on doxycycline for bronchitis which may the nausea worse, but this medication stopped about 3 days ago. She is not having vomiting. At times her pain is burning and other times fairly dull. No early satiety. She reports her weight has been stable. She had a regular bowel movement today without blood or melena. She denies heartburn, dysphagia or odynophagia, but does have a history of heartburn but states this is no longer an issue for her. She does report frequent bloating after meals.  She has a prior history of cholecystectomy. She also reports an EGD performed in [facility] about 2 years ago which revealed "gastritis and a hiatal hernia".  She took Nexium for some time but stopped due to irritability. She was also referred to a GI surgeon for evaluation of hiatal hernia repair, but she reports the surgeon did not feel this surgery was necessary for her. She recalls what sounds like a barium swallow and possibly chest  and abdominal imaging also performed at [facility].  Review of Systems As per history of present illness, otherwise negative except for dyspnea which has improved after doxycycline and initiation of beclomethasone inhaler  Current Medications, Allergies, Past Medical History, Past Surgical History, Family History and Social History were reviewed in [employer] record.     Objective:   Physical Exam BP 128/64  Pulse 91  Ht 5\' 4"  (1.626 m)  Wt 179 lb (81.194 kg)  BMI 30.73 kg/m2  SpO2 97% Constitutional: Well-developed and well-nourished. No distress. HEENT: Normocephalic and atraumatic. Oropharynx is clear and moist. No oropharyngeal exudate. Conjunctivae are normal.  No scleral icterus. Neck: Neck supple. Trachea midline. Cardiovascular: Normal rate, regular rhythm and intact distal pulses. No M/R/G Pulmonary/chest: Effort normal and breath sounds normal. No wheezing, rales or rhonchi. Abdominal: Soft, nontender, nondistended. Bowel sounds active throughout. There are no masses palpable. No hepatosplenomegaly. Extremities: no clubbing, cyanosis, or edema Lymphadenopathy: No cervical adenopathy noted. Neurological: Alert and oriented to person place and time. Skin: Skin is warm and dry. No rashes noted. Psychiatric: Normal mood and affect. Behavior is normal.  Labs -- pending today    Assessment & Plan:  78 yo female with PMH of pulmonary and dermatologic sarcoidosis, asthma, osteoarthritis, history of gallstones status post cholecystectomy, reflux disease with hiatus hernia, and atrial arrhythmia with palpitations who is seen in follow-up.  1.  Epigastric pain/nausea -- she did not have improvement of her upper GI symptoms with PPI, and she suffered from constipation as a result of this  medication. We discussed her constipation is not a common side effect of PPI, but she feels strongly this was the culprit. I will again request records from  [facility] to better understand the EGD performed there as well as the biopsies that were taken. At this point given the persistence of her symptoms, the lack of response to PPI, I recommended direct visualization with repeat EGD. We discussed this test including the risks and benefits and she is agreeable to proceed. We will discontinue the PPI for now, and make further decisions regarding acid suppression after endoscopy. I have recommended a trial of Align one capsule daily for her abdominal bloating. I will check labs today to include blood count, CMP, celiac panel and lipase.  Addendum --Records received from [facility] where she was seen by Gastroenterology with a chief complaint of abdominal pain. This record will be scanned into the EMR, and appears to have a concern for peptic ulcer disease and she was taken for upper endoscopy on 06/29/2010 which revealed a normal esophagus, a large 7 cm hiatal hernia and a normal duodenum. We started PPI therapy, recommend upper GI series to define anatomy (which after talking to the patient it sounds like she had), and surgical evaluation (which also sounds like she had) --We'll proceed as discussed above

## 2011-11-29 NOTE — Telephone Encounter (Signed)
Requested records for the 2nd time from [facility].

## 2011-11-30 LAB — TISSUE TRANSGLUTAMINASE, IGA: Tissue Transglutaminase Ab, IgA: 3.9 U/mL (ref <20)

## 2011-12-09 ENCOUNTER — Other Ambulatory Visit: Payer: Self-pay | Admitting: Gastroenterology

## 2011-12-09 DIAGNOSIS — R109 Unspecified abdominal pain: Secondary | ICD-10-CM

## 2011-12-09 MED ORDER — DIPHENHYDRAMINE HCL 50 MG PO CAPS: ORAL_CAPSULE | ORAL | Status: DC

## 2011-12-09 MED ORDER — ONDANSETRON 4 MG PO TBDP: 4.0000 mg | ORAL_TABLET | Freq: Three times a day (TID) | ORAL | Status: DC | PRN

## 2011-12-09 MED ORDER — PREDNISONE 50 MG PO TABS: ORAL_TABLET | ORAL | Status: DC

## 2011-12-13 ENCOUNTER — Telehealth: Payer: Self-pay | Admitting: *Deleted

## 2011-12-13 ENCOUNTER — Ambulatory Visit (INDEPENDENT_AMBULATORY_CARE_PROVIDER_SITE_OTHER)
Admission: RE | Admit: 2011-12-13 | Discharge: 2011-12-13 | Disposition: A | Discharge status: Home / Self Care | Payer: Medicare PPO | Source: Ambulatory Visit | Attending: Internal Medicine | Admitting: Internal Medicine

## 2011-12-13 DIAGNOSIS — R109 Unspecified abdominal pain: Secondary | ICD-10-CM

## 2011-12-13 MED ORDER — IOHEXOL 300 MG/ML  SOLN
100.0000 mL | Freq: Once | INTRAMUSCULAR | Status: AC | PRN
  Administered 2011-12-13: 100 mL via INTRAVENOUS

## 2011-12-13 NOTE — Telephone Encounter (Signed)
Informed pt of Dr Lauro Franklin findings and to continue with EGD; pt stated understanding.

## 2011-12-13 NOTE — Telephone Encounter (Signed)
Message copied by Florene Glen on Tue Dec 13, 2011  2:25 PM ------      Message from: [name]      Created: Tue Dec 13, 2011 12:38 PM       CT scan shows her large hiatal hernia but no other acute findings to explain her symptoms      We are proceeding to endoscopy, but the CT findings are reassuring

## 2011-12-15 ENCOUNTER — Encounter: Payer: Self-pay | Admitting: Internal Medicine

## 2011-12-15 ENCOUNTER — Ambulatory Visit (AMBULATORY_SURGERY_CENTER): Payer: Medicare PPO | Admitting: Internal Medicine

## 2011-12-15 ENCOUNTER — Other Ambulatory Visit (INDEPENDENT_AMBULATORY_CARE_PROVIDER_SITE_OTHER): Payer: Medicare PPO

## 2011-12-15 VITALS — BP 157/97 | HR 69 | Temp 97.5°F | Resp 21 | Ht 64.0 in | Wt 179.0 lb

## 2011-12-15 DIAGNOSIS — A048 Other specified bacterial intestinal infections: Secondary | ICD-10-CM

## 2011-12-15 DIAGNOSIS — R109 Unspecified abdominal pain: Secondary | ICD-10-CM

## 2011-12-15 DIAGNOSIS — K449 Diaphragmatic hernia without obstruction or gangrene: Secondary | ICD-10-CM

## 2011-12-15 DIAGNOSIS — K5900 Constipation, unspecified: Secondary | ICD-10-CM

## 2011-12-15 DIAGNOSIS — K253 Acute gastric ulcer without hemorrhage or perforation: Secondary | ICD-10-CM

## 2011-12-15 DIAGNOSIS — K2960 Other gastritis without bleeding: Secondary | ICD-10-CM

## 2011-12-15 LAB — URINALYSIS
Ketones, ur: NEGATIVE
Leukocytes, UA: NEGATIVE
Specific Gravity, Urine: 1.015 (ref 1.000–1.030)
pH: 8 (ref 5.0–8.0)

## 2011-12-15 LAB — BASIC METABOLIC PANEL
CO2: 31 mEq/L (ref 19–32)
GFR: 75.2 mL/min (ref >60.00)
Glucose, Bld: 108 mg/dL — ABNORMAL HIGH (ref 70–99)
Potassium: 4.1 mEq/L (ref 3.5–5.1)
Sodium: 134 mEq/L — ABNORMAL LOW (ref 135–145)

## 2011-12-15 MED ORDER — RANITIDINE HCL 150 MG PO TABS: 150.0000 mg | ORAL_TABLET | Freq: Two times a day (BID) | ORAL | Status: DC

## 2011-12-15 MED ORDER — SODIUM CHLORIDE 0.9 % IV SOLN: 500.0000 mL | INTRAVENOUS | Status: DC

## 2011-12-15 MED ORDER — SUCRALFATE 1 GM/10ML PO SUSP: 1.0000 g | Freq: Four times a day (QID) | ORAL | Status: DC

## 2011-12-15 NOTE — Progress Notes (Signed)
Patient did not experience any of the following events: a burn prior to discharge; a fall within the facility; wrong site/side/patient/procedure/implant event; or a hospital transfer or hospital admission upon discharge from the facility. (G8907) Patient did not have preoperative order for IV antibiotic SSI prophylaxis. (G8918)  

## 2011-12-15 NOTE — Op Note (Addendum)
[facility] [address] [place], [address]   ENDOSCOPY PROCEDURE REPORT  PATIENT: Kari, [name]  MR#: [id number] BIRTHDATE: [birth year] , 83  yrs. old GENDER: Female ENDOSCOPIST: [name], MD REFERRED BY: PROCEDURE DATE:  12/15/2011 PROCEDURE:  EGD w/ biopsy ASA CLASS:     Class III INDICATIONS:  Epigastric pain.   Nausea. MEDICATIONS: MAC sedation, administered by CRNA and propofol (Diprivan) 200mg  IV TOPICAL ANESTHETIC: none  DESCRIPTION OF PROCEDURE: After the risks benefits and alternatives of the procedure were thoroughly explained, informed consent was obtained.  The LB GIF-H180 [id number] endoscope was introduced through the mouth and advanced to the second portion of the duodenum. Without limitations.  The instrument was slowly withdrawn as the mucosa was fully examined.      ESOPHAGUS: A 5 cm hiatal hernia was noted.   The esophagus was otherwise normal.  STOMACH: Moderate erosive gastritis (inflammation) was found in the cardia, gastric body, and gastric fundus.  Multiple biopsies were performed using cold forceps. 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  DUODENUM: The duodenal mucosa showed no abnormalities in the bulb and second portion of the duodenum. Retroflexed views revealed a hiatal hernia as described previously. The scope was then withdrawn from the patient and the procedure completed.  COMPLICATIONS: There were no complications.  ENDOSCOPIC IMPRESSION: 1.   5 cm hiatal hernia 2.   The esophagus was otherwise normal. 3.   Erosive gastritis (inflammation) was found in the cardia, gastric body, and gastric fundus; multiple biopsies 4.   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 5.   The duodenal mucosa showed no abnormalities in the bulb and second portion of the duodenum  RECOMMENDATIONS: 1.  Await pathology results 2.  Trial of ranitidine 150 mg twice  daily (12 hours apart)  for acid suppression (previous intolerance to pantoprazole and esomeprazole) 3.  Sucralfate 1 g four times daily can be used for 1 week and longer if helpful 4.  Continue ondansetron 4 mg as directed and as needed for nausea  eSigned:  [name], MD 12/15/2011 11:15 AM Revised: 12/15/2011 11:15 AM  CC:The Patient and [name], MD  PATIENT NAME:  Kari, [name] MR#: [id number]

## 2011-12-15 NOTE — Progress Notes (Signed)
The pt tolerated the egd very well. Maw   

## 2011-12-15 NOTE — Patient Instructions (Addendum)
YOU HAD AN ENDOSCOPIC PROCEDURE TODAY AT THE [facility]: Refer to the procedure report that was given to you for any specific questions about what was found during the examination.  If the procedure report does not answer your questions, please call your gastroenterologist to clarify.  If you requested that your care partner not be given the details of your procedure findings, then the procedure report has been included in a sealed envelope for you to review at your convenience later.  YOU SHOULD EXPECT: Some feelings of bloating in the abdomen. Passage of more gas than usual.  Walking can help get rid of the air that was put into your GI tract during the procedure and reduce the bloating. If you had a lower endoscopy (such as a colonoscopy or flexible sigmoidoscopy) you may notice spotting of blood in your stool or on the toilet paper. If you underwent a bowel prep for your procedure, then you may not have a normal bowel movement for a few days.  DIET: Your first meal following the procedure should be a light meal and then it is ok to progress to your normal diet.  A half-sandwich or bowl of soup is an example of a good first meal.  Heavy or fried foods are harder to digest and may make you feel nauseous or bloated.  Likewise meals heavy in dairy and vegetables can cause extra gas to form and this can also increase the bloating.  Drink plenty of fluids but you should avoid alcoholic beverages for 24 hours.  ACTIVITY: Your care partner should take you home directly after the procedure.  You should plan to take it easy, moving slowly for the rest of the day.  You can resume normal activity the day after the procedure however you should NOT DRIVE or use heavy machinery for 24 hours (because of the sedation medicines used during the test).    SYMPTOMS TO REPORT IMMEDIATELY: A gastroenterologist can be reached at any hour.  During normal business hours, 8:30 AM to 5:00 PM Monday through Friday,  call [phone].  After hours and on weekends, please call the GI answering service at [phone] who will take a message and have the physician on call contact you.  Following upper endoscopy (EGD)  Vomiting of blood or coffee ground material  New chest pain or pain under the shoulder blades  Painful or persistently difficult swallowing  New shortness of breath  Fever of 100F or higher  Black, tarry-looking stools  FOLLOW UP: If any biopsies were taken you will be contacted by phone or by letter within the next 1-3 weeks.  Call your gastroenterologist if you have not heard about the biopsies in 3 weeks.  Our staff will call the home number listed on your records the next business day following your procedure to check on you and address any questions or concerns that you may have at that time regarding the information given to you following your procedure. This is a courtesy call and so if there is no answer at the home number and we have not heard from you through the emergency physician on call, we will assume that you have returned to your regular daily activities without incident.  SIGNATURES/CONFIDENTIALITY: You and/or your care partner have signed paperwork which will be entered into your electronic medical record.  These signatures attest to the fact that that the information above on your After Visit Summary has been reviewed and is understood.  Full responsibility of   the confidentiality of this discharge information lies with you and/or your care-partner. 

## 2011-12-16 ENCOUNTER — Telehealth: Payer: Self-pay | Admitting: *Deleted

## 2011-12-16 LAB — URINE CULTURE: Organism ID, Bacteria: NO GROWTH

## 2011-12-16 NOTE — Telephone Encounter (Signed)
No answer, message left for the patient. 

## 2011-12-21 ENCOUNTER — Telehealth: Payer: Self-pay | Admitting: *Deleted

## 2011-12-21 ENCOUNTER — Encounter: Payer: Self-pay | Admitting: Internal Medicine

## 2011-12-21 MED ORDER — LANSOPRAZOLE 30 MG PO CPDR: 30.0000 mg | DELAYED_RELEASE_CAPSULE | Freq: Two times a day (BID) | ORAL | Status: DC

## 2011-12-21 MED ORDER — BIS SUBCIT-METRONID-TETRACYC 140-125-125 MG PO CAPS: 3.0000 | ORAL_CAPSULE | Freq: Three times a day (TID) | ORAL | Status: DC

## 2011-12-21 NOTE — Telephone Encounter (Signed)
Message copied by Florene Glen on Wed Dec 21, 2011  2:27 PM ------      Message from: [name]      Created: Wed Dec 21, 2011 10:37 AM       H. pylori positive, please start Pylera with lansoprazole 30 mg twice a day as the PPI      Once she has completed antibody therapy she can go back to ranitidine 150 mg twice a day      H. pylori stool antigen in 8 weeks to confirm eradication

## 2011-12-21 NOTE — Telephone Encounter (Signed)
lmom for pt that I ordered something for H.Pylori and explained why, etc. She needs to take the Prevacid with the Pylera and I will call her Monday and explain everything to her; it may take a couple of days to get the med.

## 2011-12-30 ENCOUNTER — Telehealth: Payer: Self-pay | Admitting: Internal Medicine

## 2011-12-30 NOTE — Telephone Encounter (Signed)
Pt instructed on the need for Pylera and the Lansoprazole; I will mail her instructions and Dr Lauro Franklin notes. Pt stated understanding.

## 2012-01-23 ENCOUNTER — Encounter: Payer: Self-pay | Admitting: Emergency Medicine

## 2012-01-23 ENCOUNTER — Ambulatory Visit (INDEPENDENT_AMBULATORY_CARE_PROVIDER_SITE_OTHER): Payer: Medicare PPO | Admitting: Emergency Medicine

## 2012-01-23 VITALS — BP 150/88 | HR 83 | Temp 97.5°F | Ht 64.0 in | Wt 180.4 lb

## 2012-01-23 DIAGNOSIS — J45909 Unspecified asthma, uncomplicated: Secondary | ICD-10-CM

## 2012-01-23 NOTE — Progress Notes (Signed)
  Subjective:    Patient ID: Kari [name], female    DOB: March 02, 1928, 78 y.o.   MRN: [id number]  HPI 78 yo woman, former smoker 20 pk-yrs, hx childhood asthma, allergic rhinitis, A Fib, hiatal hernia seen by Dr Rhea Belton. She is a lifelong asthmatic, started to bother her more in her in the last 78 years. She was dx with sarcoidosis after bx of her skin at the [facility]. She has been found to have pulmonary nodules, never biopsied. Her most recent CT scan of the chest was in [place], [place]. She has had PFT at [facility]. Her breathing has been stable, has been taking Advair but she believes it is causing her LE pain, MSK pain.  Uses proventil prn.   ROV 11/21/11 -- 78 with hx longstanding asthma and sarcoidosis dx by skin bx. Seen recently in cardiology clinic for palpitations - ? Hx A fib, but documented PVC's. We stopped Advair last visit due to side effects, changed to symbicort. She was unable to use the Symbicort due to visual changes. She reports that she has been having nasal congestion and cough for the last week - ? Allergies, no known sick contacts. She has been on doxycycline 1 week. She has been using SABA about 1-2x a day. Overall she believes her breathing is improved.   CT scan chest 10/14/09 [facility], [place]) >> hyperinflation, hiatal hernia, sub-cm pretracheal nodes, no obvious nodules.   ROV 01/23/12 -- asthma and sarcoidosis dx by skin bx. Has been difficult for her to tolerate LABA/ICS. Last time we tried QVAR - she developed A Fib 2 weeks later and stopped it. Has been using SABA about 2 x a day usually, has been using 4x a day last few days. She states that she has been more SOB x 3 weeks. Has had some nasal congestion, some rare GERD sx from her hiatal hernia.       Objective:   Physical Exam Filed Vitals:   01/23/12 1340  BP: 150/88  Pulse: 83  Temp: 97.5 F (36.4 C)   Gen: Pleasant, elderly woman, in no distress,  Very tangential, difficult to take a hx.  ENT: No lesions,   mouth clear,  oropharynx clear, no postnasal drip  Neck: No JVD, no TMG, no carotid bruits  Lungs: No use of accessory muscles, no wheezes  Cardiovascular: RRR, heart sounds normal, no murmur or gallops, no peripheral edema  Musculoskeletal: No deformities, no cyanosis or clubbing  Neuro: alert, non focal  Skin: Warm, no lesions or rashes      Assessment & Plan:  Asthma Doubt that QVAR was responsible for the A fib she experienced, would like to retry it. Will decrease to 1 puff daily. Treat allergies, continue zantac  Please start loratadine 10mg  (Claritin) daily Restart your QVAR . Only use 1 puff once a day. Remember to rinse out your mouth after using Use albuterol 2 puffs if needed for your shortness of breath.  Follow with Dr Delton Coombes in 3 months or sooner if you have any problems

## 2012-01-23 NOTE — Patient Instructions (Addendum)
Please start loratadine 10mg  (Claritin) daily Restart your QVAR . Only use 1 puff once a day. Remember to rinse out your mouth after using Use albuterol 2 puffs if needed for your shortness of breath.  Follow with Dr Delton Coombes in 3 months or sooner if you have any problems.

## 2012-01-23 NOTE — Assessment & Plan Note (Signed)
Doubt that QVAR was responsible for the A fib she experienced, would like to retry it. Will decrease to 1 puff daily. Treat allergies, continue zantac  Please start loratadine 10mg  (Claritin) daily Restart your QVAR . Only use 1 puff once a day. Remember to rinse out your mouth after using Use albuterol 2 puffs if needed for your shortness of breath.  Follow with Dr Delton Coombes in 3 months or sooner if you have any problems

## 2012-02-27 ENCOUNTER — Telehealth: Payer: Self-pay | Admitting: *Deleted

## 2012-02-27 DIAGNOSIS — A048 Other specified bacterial intestinal infections: Secondary | ICD-10-CM

## 2012-02-27 NOTE — Telephone Encounter (Signed)
Message copied by Florene Glen on Mon Feb 27, 2012  8:31 AM ------      Message from: Florene Glen      Created: Fri Dec 30, 2011  9:43 AM       Call to schedule Hpylori stool antigen per path result note

## 2012-02-27 NOTE — Telephone Encounter (Signed)
Informed pt she needs to do a stool antigen test to ensure eradication of H. Pylori; pt stated understanding. She remains on Zantac bid and states she's a lot better. She reports pain at times when she swallows and in her stomach; states a lot of it could be d/t her HH. Pt would like to know what the procedure is to change Pulmonologists. The scheduler in Pulmonology states the present MD is asked about the change and then the new MD is asked.

## 2012-02-28 ENCOUNTER — Encounter: Payer: Self-pay | Admitting: Physician Assistant

## 2012-03-02 ENCOUNTER — Other Ambulatory Visit: Payer: Medicare PPO

## 2012-03-02 DIAGNOSIS — A048 Other specified bacterial intestinal infections: Secondary | ICD-10-CM

## 2012-03-03 LAB — HELICOBACTER PYLORI  SPECIAL ANTIGEN: H. PYLORI Antigen: NEGATIVE

## 2012-03-07 ENCOUNTER — Telehealth: Payer: Self-pay | Admitting: Emergency Medicine

## 2012-03-07 NOTE — Telephone Encounter (Signed)
I spoke with pt and she is requesting to switch from RB to [facility]. She states she didn't feel good with her last visits w/ RB. Pt aware of our protocol. Please advise RB thanks

## 2012-03-07 NOTE — Telephone Encounter (Signed)
Informed pt stool antigen was neg. She will call [facility] about changing MD.

## 2012-03-07 NOTE — Telephone Encounter (Signed)
This OK with me. Very nice lady. Please check with Dr Maple Hudson to make sure he agrees.

## 2012-03-12 NOTE — Telephone Encounter (Signed)
Dr. Maple Hudson, is it okay with you for the pt to switch to you?

## 2012-03-12 NOTE — Telephone Encounter (Signed)
Per CY-okay; he will accept this patient. Please put in next open consult slot so he may have time to review with patient. Thanks.

## 2012-03-12 NOTE — Telephone Encounter (Signed)
Spoke with pt and scheduled appt with CDY for 04/13/12 at 3:30 pm Nothing further needed per pt

## 2012-04-13 ENCOUNTER — Encounter: Payer: Self-pay | Admitting: Internal Medicine

## 2012-04-13 ENCOUNTER — Ambulatory Visit (INDEPENDENT_AMBULATORY_CARE_PROVIDER_SITE_OTHER): Payer: Medicare PPO | Admitting: Internal Medicine

## 2012-04-13 ENCOUNTER — Ambulatory Visit (INDEPENDENT_AMBULATORY_CARE_PROVIDER_SITE_OTHER)
Admission: RE | Admit: 2012-04-13 | Discharge: 2012-04-13 | Disposition: A | Discharge status: Home / Self Care | Payer: Medicare PPO | Source: Ambulatory Visit | Attending: Internal Medicine | Admitting: Internal Medicine

## 2012-04-13 VITALS — BP 124/60 | HR 87 | Ht 63.0 in | Wt 178.6 lb

## 2012-04-13 DIAGNOSIS — D869 Sarcoidosis, unspecified: Secondary | ICD-10-CM

## 2012-04-13 DIAGNOSIS — D860 Sarcoidosis of lung: Secondary | ICD-10-CM

## 2012-04-13 DIAGNOSIS — J45909 Unspecified asthma, uncomplicated: Secondary | ICD-10-CM

## 2012-04-13 DIAGNOSIS — J99 Respiratory disorders in diseases classified elsewhere: Secondary | ICD-10-CM

## 2012-04-13 NOTE — Progress Notes (Signed)
01/23/12- Dr Delton Coombes HPI 79 yo woman, former smoker 20 pk-yrs, hx childhood asthma, allergic rhinitis, A Fib, hiatal hernia seen by Dr Rhea Belton. She is a lifelong asthmatic, started to bother her more in her in the last 10 years. She was dx with sarcoidosis after bx of her skin at the [facility]. She has been found to have pulmonary nodules, never biopsied. Her most recent CT scan of the chest was in [place], [place]. She has had PFT at [facility]. Her breathing has been stable, has been taking Advair but she believes it is causing her LE pain, MSK pain.  Uses proventil prn.   ROV 11/21/11 -- 83 with hx longstanding asthma and sarcoidosis dx by skin bx. Seen recently in cardiology clinic for palpitations - ? Hx A fib, but documented PVC's. We stopped Advair last visit due to side effects, changed to symbicort. She was unable to use the Symbicort due to visual changes. She reports that she has been having nasal congestion and cough for the last week - ? Allergies, no known sick contacts. She has been on doxycycline 1 weekShe has been using SABA about 1-2x a day. Overall she believes her breathing is improved.  CT scan chest 10/14/09 [facility], [place]) >> hyperinflation, hiatal hernia, sub-cm pretracheal nodes, no obvious nodules.  ROV 01/23/12 -- asthma and sarcoidosis dx by skin bx. Has been difficult for her to tolerate LABA/ICS. Last time we tried QVAR - she developed A Fib 2 weeks later and stopped it. Has been using SABA about 2 x a day usually, has been using 4x a day last few days. She states that she has been more SOB x 3 weeks. Has had some nasal congestion, some rare GERD sx from her hiatal hernia.   04/13/12-  New Consult- Change from RB to CY at the patient's request. Here with friend.  23 yo woman, former smoker 20 pk-yrs, hx childhood asthma, allergic rhinitis, A Fib, hiatal hernia, sarcoid.   PCP Dr [name] in [place]. She gives a history that sarcoid was diagnosed many years ago by skin biopsy  presented with lung nodules and adenopathy. Nodules resolved after treatment with prednisone for 3 months. Never had lung biopsy. She has had repeated courses of prednisone for "allergy" in the 1980s. Now has osteoporosis. She had smoked one pack per day until stopping in 1975. She has had asthma since childhood, worse in the fall and spring. Advair and Symbicort caused myalgias in standard dose but she tolerates Symbicort 160 taking one puff twice daily. Today she feels well. Triggers include strong odors, virus colds and  seasonal changes. History of paroxysmal atrial fibrillation and hypertension. Her primary physician gave her Pneumovax.  Prior to Admission medications   Medication Sig Start Date End Date Taking? Authorizing Provider  Albuterol Sulfate (PROVENTIL HFA IN) Inhale 180 mcg into the lungs as directed.   Yes Historical Provider, MD  budesonide-formoterol (SYMBICORT) 160-4.5 MCG/ACT inhaler Inhale 1 puff into the lungs 2 (two) times daily.   Yes Historical Provider, MD  Calcium Carbonate-Vitamin D (CALTRATE 600+D) 600-400 MG-UNIT per tablet Take 1 tablet by mouth daily.   Yes Historical Provider, MD  hydrochlorothiazide (MICROZIDE) 12.5 MG capsule Take 1 capsule by mouth daily. 04/12/12  Yes Historical Provider, MD  levothyroxine (SYNTHROID, LEVOTHROID) 112 MCG tablet Take 112 mcg by mouth daily.   Yes Historical Provider, MD  multivitamin [facility]) per tablet Take 1 tablet by mouth daily.   Yes Historical Provider, MD  ranitidine (ZANTAC) 150 MG  tablet Take 1 tablet (150 mg total) by mouth 2 (two) times daily. 12/15/11  Yes [name], MD  Spacer/Aero-Holding Chambers (AEROCHAMBER MINI CHAMBER) DEVI 1 Container by Does not apply route daily. 11/21/11   Leslye Peer, MD   .pmh Past Surgical History  Procedure Laterality Date  . Colonoscopy    . Cardiolite  2000  . Cholecystectomy  04/1996  . Abdominal hysterectomy  10/1997  . Hernia repair      inguinal and  hiatal/abdominal  . Leg surgery      right leg...rod/screws  ' Family History  Problem Relation Age of Onset  . Diabetes Daughter   . Heart disease Mother   . Diabetes Father   . Diabetes Brother   . Other Sister     scarlet fever  . Rheumatic fever Sister   . Colon cancer Neg Hx    History   Social History  . Marital Status: Divorced    Spouse Name: N/A    Number of Children: 4  . Years of Education: N/A   Occupational History  . clergy    Social History Main Topics  . Smoking status: Former Smoker -- 2.00 packs/day for 10 years    Types: Cigarettes, Pipe  . Smokeless tobacco: Never Used     Comment: pt unsure of exact month in 1977  . Alcohol Use: Yes     Comment: seldom...glass wine  . Drug Use: No  . Sexually Active: Not on file   Other Topics Concern  . Not on file   Social History Narrative  . No narrative on file   ROS-see HPI Constitutional:   No-   weight loss, night sweats, fevers, chills, fatigue, lassitude. HEENT:   No-  headaches, difficulty swallowing, tooth/dental problems, sore throat,       No-  sneezing, itching, ear ache, nasal congestion, post nasal drip,  CV:  No-   chest pain, orthopnea, PND, +swelling in lower extremities, anasarca,                                  dizziness, + palpitations Resp: + shortness of breath with exertion or at rest.              No-   productive cough,  No non-productive cough,  No- coughing up of blood.              No-   change in color of mucus.  No- wheezing.   Skin: No-   rash or lesions. GI:  +heartburn, indigestion, abdominal pain, nausea, vomiting, diarrhea,                 change in bowel habits, loss of appetite GU: No-   dysuria, change in color of urine, no urgency or frequency.  No- flank pain. MS:  No-   joint pain or swelling.  No- decreased range of motion.  No- back pain. Neuro-     nothing unusual Psych:  No- change in mood or affect. No depression or anxiety.  No memory loss.  OBJ- Physical  Exam General- Alert, Oriented, Affect-appropriate, Distress- none acute Skin- rash-none, lesions- none, excoriation- none Lymphadenopathy- none Head- atraumatic            Eyes- Gross vision intact, PERRLA, conjunctivae and secretions clear            Ears- Hearing, canals-normal. TMs opacified  Nose- Clear, no-Septal dev, mucus, polyps, erosion, perforation             Throat- Mallampati II , mucosa clear , drainage- none, tonsils- atrophic Neck- flexible , trachea midline, no stridor , thyroid nl, carotid no bruit Chest - symmetrical excursion , unlabored           Heart/CV- RRR , no murmur , no gallop  , no rub, nl s1 s2                           - JVD- none , edema- none, stasis changes- none, varices- none           Lung- clear to P&A, wheeze- none, cough- none , dullness-none, rub- none           Chest wall-  Abd- tender-no, distended-no, bowel sounds-present, HSM- no Br/ Gen/ Rectal- Not done, not indicated Extrem- cyanosis- none, clubbing, none, atrophy- none, strength- nl Neuro- grossly intact to observation

## 2012-04-13 NOTE — Patient Instructions (Addendum)
Order- CXR   Dx asthma, hx of sarcoid              schedule PFT  Ok to try using Symbicort 160    1 puff then rise mouth, twice daily  Ok to use the Proventil HFA albuterol inhaler  1 or 2 puffs every 6 hours if needed

## 2012-04-20 NOTE — Assessment & Plan Note (Signed)
Sarcoid is probably in- active in long-term remission. Plan-update chest x-ray

## 2012-04-20 NOTE — Assessment & Plan Note (Signed)
Probable diagnosis is asthma with COPD. She gives a lifelong history of asthma and seasonal variation and irritant triggers. She smoked one pack per day until 1975. Plan-okay to try one puff with Symbicort twice daily for long-term control since she has that medication now. Schedule PFT and chest x-ray. Encouraged to remain active.

## 2012-04-20 NOTE — Progress Notes (Signed)
Quick Note:  Pt aware of results. ______ 

## 2012-05-25 ENCOUNTER — Encounter: Payer: Self-pay | Admitting: Internal Medicine

## 2012-05-25 ENCOUNTER — Ambulatory Visit (INDEPENDENT_AMBULATORY_CARE_PROVIDER_SITE_OTHER): Payer: Medicare PPO | Admitting: Internal Medicine

## 2012-05-25 ENCOUNTER — Ambulatory Visit: Payer: Medicare PPO

## 2012-05-25 VITALS — BP 112/74 | HR 71 | Ht 63.0 in | Wt 177.0 lb

## 2012-05-25 DIAGNOSIS — D860 Sarcoidosis of lung: Secondary | ICD-10-CM

## 2012-05-25 DIAGNOSIS — J45909 Unspecified asthma, uncomplicated: Secondary | ICD-10-CM

## 2012-05-25 DIAGNOSIS — J449 Chronic obstructive pulmonary disease, unspecified: Secondary | ICD-10-CM

## 2012-05-25 DIAGNOSIS — J4489 Other specified chronic obstructive pulmonary disease: Secondary | ICD-10-CM

## 2012-05-25 DIAGNOSIS — J99 Respiratory disorders in diseases classified elsewhere: Secondary | ICD-10-CM

## 2012-05-25 DIAGNOSIS — D869 Sarcoidosis, unspecified: Secondary | ICD-10-CM

## 2012-05-25 DIAGNOSIS — R918 Other nonspecific abnormal finding of lung field: Secondary | ICD-10-CM

## 2012-05-25 DIAGNOSIS — R222 Localized swelling, mass and lump, trunk: Secondary | ICD-10-CM

## 2012-05-25 LAB — BASIC METABOLIC PANEL
BUN: 10 mg/dL (ref 6–23)
CO2: 26 mEq/L (ref 19–32)
Calcium: 9.4 mg/dL (ref 8.4–10.5)
Creat: 0.77 mg/dL (ref 0.50–1.10)

## 2012-05-25 LAB — PULMONARY FUNCTION TEST

## 2012-05-25 MED ORDER — PREDNISONE 50 MG PO TABS: ORAL_TABLET | ORAL | Status: DC

## 2012-05-25 MED ORDER — TIOTROPIUM BROMIDE MONOHYDRATE 18 MCG IN CAPS: 18.0000 ug | ORAL_CAPSULE | Freq: Every day | RESPIRATORY_TRACT | Status: DC

## 2012-05-25 NOTE — Patient Instructions (Addendum)
Ok to stop symbicort and see if the muscle ache gets better  Sample Spiriva     1 daily    See if it helps shortness of breath  Order- CT chest with contrast    Dx Right hilar mass

## 2012-05-25 NOTE — Progress Notes (Signed)
PFT done today. 

## 2012-05-25 NOTE — Progress Notes (Signed)
01/23/12- Dr Delton Coombes HPI 79 yo woman, former smoker 20 pk-yrs, hx childhood asthma, allergic rhinitis, A Fib, hiatal hernia seen by Dr Rhea Belton. She is a lifelong asthmatic, started to bother her more in her in the last 10 years. She was dx with sarcoidosis after bx of her skin [facility]. She has been found to have pulmonary nodules, never biopsied. Her most recent CT scan of the chest was in [place], [place]. She has had PFT at [facility]. Her breathing has been stable, has been taking Advair but she believes it is causing her LE pain, MSK pain.  Uses proventil prn.   ROV 11/21/11 -- 83 with hx longstanding asthma and sarcoidosis dx by skin bx. Seen recently in cardiology clinic for palpitations - ? Hx A fib, but documented PVC's. We stopped Advair last visit due to side effects, changed to symbicort. She was unable to use the Symbicort due to visual changes. She reports that she has been having nasal congestion and cough for the last week - ? Allergies, no known sick contacts. She has been on doxycycline 1 weekShe has been using SABA about 1-2x a day. Overall she believes her breathing is improved.  CT scan chest 10/14/09 [facility], [place]) >> hyperinflation, hiatal hernia, sub-cm pretracheal nodes, no obvious nodules.  ROV 01/23/12 -- asthma and sarcoidosis dx by skin bx. Has been difficult for her to tolerate LABA/ICS. Last time we tried QVAR - she developed A Fib 2 weeks later and stopped it. Has been using SABA about 2 x a day usually, has been using 4x a day last few days. She states that she has been more SOB x 3 weeks. Has had some nasal congestion, some rare GERD sx from her hiatal hernia.   04/13/12-  New Consult- Change from RB to CY at the patient's request. Here with friend.  5 yo woman, former smoker 20 pk-yrs, hx childhood asthma, allergic rhinitis, A Fib, hiatal hernia, sarcoid.   PCP Dr [name] in [place]. She gives a history that sarcoid was diagnosed many years ago by skin biopsy  presented with lung nodules and adenopathy. Nodules resolved after treatment with prednisone for 3 months. Never had lung biopsy. She has had repeated courses of prednisone for "allergy" in the 1980s. Now has osteoporosis. She had smoked one pack per day until stopping in 1975. She has had asthma since childhood, worse in the fall and spring. Advair and Symbicort caused myalgias in standard dose but she tolerates Symbicort 160 taking one puff twice daily. Today she feels well. Triggers include strong odors, virus colds and  seasonal changes. History of paroxysmal atrial fibrillation and hypertension. Her primary physician gave her Pneumovax.  05/25/12- Change from RB to CY at the patient's request. Here with friend. 73 yo woman, former smoker 20 pk-yrs, hx childhood asthma, allergic rhinitis, A Fib, hiatal hernia,   Hx sarcoid.   PCP Dr [name] in [place] FOLLOWS FOR: states that breathing is fine as long as she is using Symbicort; allergies have flared up at this time. Review PFT results with patient. She blames bronchodilators for myalgias in her legs-discussed. We discussed her history of steroid exposure. She wants clear to home with heavy exposure to Clorox. Little cough or wheeze now if she mows yards. Denies chest pain. History of sarcoid with adenopathy in the past. CXR 04/20/12 IMPRESSION:  Rounded prominence of the right hilum on the PA image is probably  unchanged compared with previous study. It is probably vascular  but I cannot exclude adenopathy. There is a hiatal hernia in the  retrocardiac region. No acute pulmonary infiltrative densities are  seen.  Original Report Authenticated By: Onalee Hua Call PFT 05-2012-moderate obstructive airways disease with little response to bronchodilator, air trapping, normal diffusion. FVC 2.34/97%, FEV1 1.32/81%, FEV1/FVC 0.56, RV 147%, DLCO 90%.  ROS-see HPI Constitutional:   No-   weight loss, night sweats, fevers, chills, fatigue,  lassitude. HEENT:   No-  headaches, difficulty swallowing, tooth/dental problems, sore throat,       No-  sneezing, itching, ear ache, nasal congestion, post nasal drip,  CV:  No-   chest pain, orthopnea, PND, swelling in lower extremities, anasarca,                                  dizziness, + palpitations Resp: + shortness of breath with exertion or at rest.              No-   productive cough,  No non-productive cough,  No- coughing up of blood.              No-   change in color of mucus.  No- wheezing.   Skin: No-   rash or lesions. GI:  +heartburn, indigestion, abdominal pain, nausea, vomiting, diarrhea,                 change in bowel habits, loss of appetite GU: No-   dysuria, change in color of urine, no urgency or frequency.  No- flank pain. MS:  No-   joint pain or swelling.  No- decreased range of motion.  No- back pain. Neuro-     nothing unusual Psych:  No- change in mood or affect. No depression or anxiety.  No memory loss.  OBJ- Physical Exam General- Alert, Oriented, Affect-appropriate, Distress- none acute Skin- rash-none, lesions- none, excoriation- none Lymphadenopathy- none Head- atraumatic            Eyes- Gross vision intact, PERRLA, conjunctivae and secretions clear            Ears- Hearing, canals-normal. TMs opacified            Nose- Clear, no-Septal dev, mucus, polyps, erosion, perforation             Throat- Mallampati II , mucosa clear , drainage- none, tonsils- atrophic Neck- flexible , trachea midline, no stridor , thyroid nl, carotid no bruit Chest - symmetrical excursion , unlabored           Heart/CV- RRR , no murmur , no gallop  , no rub, nl s1 s2                           - JVD- none , edema- none, stasis changes- none, varices- none           Lung- clear to P&A, wheeze- none, cough- none , dullness-none, rub- none           Chest wall-  Abd-  Br/ Gen/ Rectal- Not done, not indicated Extrem- cyanosis- none, clubbing, none, atrophy- none, strength-  nl Neuro- grossly intact to observation

## 2012-05-29 ENCOUNTER — Ambulatory Visit (INDEPENDENT_AMBULATORY_CARE_PROVIDER_SITE_OTHER)
Admission: RE | Admit: 2012-05-29 | Discharge: 2012-05-29 | Disposition: A | Discharge status: Home / Self Care | Payer: Medicare PPO | Source: Ambulatory Visit | Attending: Internal Medicine | Admitting: Internal Medicine

## 2012-05-29 DIAGNOSIS — R222 Localized swelling, mass and lump, trunk: Secondary | ICD-10-CM

## 2012-05-29 DIAGNOSIS — R918 Other nonspecific abnormal finding of lung field: Secondary | ICD-10-CM

## 2012-05-29 MED ORDER — IOHEXOL 300 MG/ML  SOLN
80.0000 mL | Freq: Once | INTRAMUSCULAR | Status: AC | PRN
  Administered 2012-05-29: 80 mL via INTRAVENOUS

## 2012-06-01 ENCOUNTER — Telehealth: Payer: Self-pay | Admitting: Internal Medicine

## 2012-06-01 MED ORDER — TIOTROPIUM BROMIDE MONOHYDRATE 18 MCG IN CAPS: 18.0000 ug | ORAL_CAPSULE | Freq: Every day | RESPIRATORY_TRACT | Status: DC

## 2012-06-01 NOTE — Telephone Encounter (Signed)
Rx has been sent in. Pt is aware. 

## 2012-06-05 NOTE — Assessment & Plan Note (Signed)
Fullness right hilum on CXR is probably vascular but former smoker with history of sarcoid. Need to rule out adenopathy. Plan-CT chest to evaluate right hilar prominence.

## 2012-06-05 NOTE — Assessment & Plan Note (Signed)
Not much chance for medications. Plan-try Spiriva

## 2012-07-10 ENCOUNTER — Encounter: Payer: Self-pay | Admitting: Internal Medicine

## 2012-07-10 ENCOUNTER — Ambulatory Visit (INDEPENDENT_AMBULATORY_CARE_PROVIDER_SITE_OTHER): Payer: Medicare PPO | Admitting: Internal Medicine

## 2012-07-10 VITALS — BP 114/62 | HR 80 | Ht 63.0 in | Wt 172.4 lb

## 2012-07-10 DIAGNOSIS — J449 Chronic obstructive pulmonary disease, unspecified: Secondary | ICD-10-CM

## 2012-07-10 NOTE — Progress Notes (Signed)
01/23/12- Dr Delton Coombes HPI 79 yo woman, former smoker 20 pk-yrs, hx childhood asthma, allergic rhinitis, A Fib, hiatal hernia seen by Dr Rhea Belton. She is a lifelong asthmatic, started to bother her more in her in the last 10 years. She was dx with sarcoidosis after bx of her skin at the [facility]. She has been found to have pulmonary nodules, never biopsied. Her most recent CT scan of the chest was in [place], [place]. She has had PFT at [facility]. Her breathing has been stable, has been taking Advair but she believes it is causing her LE pain, MSK pain.  Uses proventil prn.   ROV 11/21/11 -- 83 with hx longstanding asthma and sarcoidosis dx by skin bx. Seen recently in cardiology clinic for palpitations - ? Hx A fib, but documented PVC's. We stopped Advair last visit due to side effects, changed to symbicort. She was unable to use the Symbicort due to visual changes. She reports that she has been having nasal congestion and cough for the last week - ? Allergies, no known sick contacts. She has been on doxycycline 1 weekShe has been using SABA about 1-2x a day. Overall she believes her breathing is improved.  CT scan chest 10/14/09 [facility], [place]) >> hyperinflation, hiatal hernia, sub-cm pretracheal nodes, no obvious nodules.  ROV 01/23/12 -- asthma and sarcoidosis dx by skin bx. Has been difficult for her to tolerate LABA/ICS. Last time we tried QVAR - she developed A Fib 2 weeks later and stopped it. Has been using SABA about 2 x a day usually, has been using 4x a day last few days. She states that she has been more SOB x 3 weeks. Has had some nasal congestion, some rare GERD sx from her hiatal hernia.   04/13/12-  New Consult- Change from RB to CY at the patient's request. Here with friend.  75 yo woman, former smoker 20 pk-yrs, hx childhood asthma, allergic rhinitis, A Fib, hiatal hernia, sarcoid.   PCP Dr [name] in [place]. She gives a history that sarcoid was diagnosed many years ago by skin biopsy  presented with lung nodules and adenopathy. Nodules resolved after treatment with prednisone for 3 months. Never had lung biopsy. She has had repeated courses of prednisone for "allergy" in the 1980s. Now has osteoporosis. She had smoked one pack per day until stopping in 1975. She has had asthma since childhood, worse in the fall and spring. Advair and Symbicort caused myalgias in standard dose but she tolerates Symbicort 160 taking one puff twice daily. Today she feels well. Triggers include strong odors, virus colds and  seasonal changes. History of paroxysmal atrial fibrillation and hypertension. Her primary physician gave her Pneumovax.  05/25/12- Change from RB to CY at the patient's request. Here with friend. 45 yo woman, former smoker 20 pk-yrs, hx childhood asthma, allergic rhinitis, A Fib, hiatal hernia,   Hx sarcoid.   PCP Dr [name] in [place] FOLLOWS FOR: states that breathing is fine as long as she is using Symbicort; allergies have flared up at this time. Review PFT results with patient. She blames bronchodilators for myalgias in her legs-discussed. We discussed her history of steroid exposure. She wants clear to home with heavy exposure to Clorox. Little cough or wheeze now if she mows yards. Denies chest pain. History of sarcoid with adenopathy in the past. CXR 04/20/12 IMPRESSION:  Rounded prominence of the right hilum on the PA image is probably  unchanged compared with previous study. It is probably vascular  but I cannot exclude adenopathy. There is a hiatal hernia in the  retrocardiac region. No acute pulmonary infiltrative densities are  seen.  Original Report Authenticated By: Onalee Hua Call PFT 05-2012-moderate obstructive airways disease with little response to bronchodilator, air trapping, normal diffusion. FVC 2.34/97%, FEV1 1.32/81%, FEV1/FVC 0.56, RV 147%, DLCO 90%.  07/10/12- 30 yo woman, former smoker 20 pk-yrs, hx childhood asthma/ COPD, allergic rhinitis, A Fib,  hiatal hernia, Hx sarcoid.   PCP Dr [name] in [place] FOLLOWS FOR: slight congestion but feels it is going away; Sprivia working well but voice gets husky and dry. Pt states she is having severe dry mouth and had diarrhea this morning Breathing well. Spiriva causing dry mouth. Reflux precautions. Uses rescue inhaler occasionally. CT chest 05/29/12 IMPRESSION:  Normal chest CT. Pulmonary artery/vascular pedicle correspond to  the area of questioned right hilar fullness.  Small hiatal hernia.  Original Report Authenticated By: Christiana Pellant, M.D.   ROS-see HPI Constitutional:   No-   weight loss, night sweats, fevers, chills, fatigue, lassitude. HEENT:   No-  headaches, difficulty swallowing, tooth/dental problems, sore throat,       No-  sneezing, itching, ear ache, nasal congestion, post nasal drip,  CV:  No-   chest pain, orthopnea, PND, swelling in lower extremities, anasarca,                                                dizziness, + palpitations Resp: + shortness of breath with exertion or at rest.              No-   productive cough,  No non-productive cough,  No- coughing up of blood.              No-   change in color of mucus.  No- wheezing.   Skin: No-   rash or lesions. GI:  +heartburn, indigestion, abdominal pain, nausea, vomiting,  GU: . MS:  No-   joint pain or swelling.   Neuro-     nothing unusual Psych:  No- change in mood or affect. No depression or anxiety.  No memory loss.  OBJ- Physical Exam General- Alert, Oriented, Affect-appropriate, Distress- none acute Skin- rash-none, lesions- none, excoriation- none Lymphadenopathy- none Head- atraumatic            Eyes- Gross vision intact, PERRLA, conjunctivae and secretions clear            Ears- Hearing, canals-normal. TMs opacified            Nose- Clear, no-Septal dev, mucus, polyps, erosion, perforation             Throat- Mallampati II , mucosa clear- not dry , drainage- none, tonsils- atrophic Neck-  flexible , trachea midline, no stridor , thyroid nl, carotid no bruit Chest - symmetrical excursion , unlabored           Heart/CV- RRR , no murmur , no gallop  , no rub, nl s1 s2                           - JVD- none , edema- none, stasis changes- none, varices- none           Lung- clear to P&A, wheeze- none, cough- none , dullness-none, rub- none  Chest wall-  Abd-  Br/ Gen/ Rectal- Not done, not indicated Extrem- cyanosis- none, clubbing, none, atrophy- none, strength- nl Neuro- grossly intact to observation

## 2012-07-10 NOTE — Patient Instructions (Addendum)
No need to use Combivent inhaler, since you have the Proventil (albuterol) rescue inhaler and the Spiriva    Try using just one puff at a time of the Proventil, to avoid the rapid heart beat  Try Biotene for the dry mouth as needed

## 2012-07-25 NOTE — Assessment & Plan Note (Signed)
Better control. We discussed Spiriva and dry mouth sensation but she is satisfied is drinking a little more water. Plan-try Biotene, remove Combivent from med list

## 2012-10-12 ENCOUNTER — Telehealth: Payer: Self-pay | Admitting: Internal Medicine

## 2012-10-15 NOTE — Telephone Encounter (Signed)
Spoke with pt and her sister is at [facility] with a possible CVA. She will call if she needs anything from [place].

## 2012-11-05 ENCOUNTER — Other Ambulatory Visit: Payer: Self-pay | Admitting: Internal Medicine

## 2012-11-05 ENCOUNTER — Encounter: Payer: Self-pay | Admitting: Internal Medicine

## 2012-11-08 ENCOUNTER — Encounter: Payer: Self-pay | Admitting: Internal Medicine

## 2012-11-08 ENCOUNTER — Ambulatory Visit (INDEPENDENT_AMBULATORY_CARE_PROVIDER_SITE_OTHER): Payer: Medicare PPO | Admitting: Internal Medicine

## 2012-11-08 ENCOUNTER — Telehealth: Payer: Self-pay | Admitting: *Deleted

## 2012-11-08 VITALS — BP 128/68 | HR 69 | Ht 63.5 in | Wt 174.0 lb

## 2012-11-08 DIAGNOSIS — B9681 Helicobacter pylori [H. pylori] as the cause of diseases classified elsewhere: Secondary | ICD-10-CM

## 2012-11-08 DIAGNOSIS — A048 Other specified bacterial intestinal infections: Secondary | ICD-10-CM

## 2012-11-08 DIAGNOSIS — B029 Zoster without complications: Secondary | ICD-10-CM

## 2012-11-08 NOTE — Patient Instructions (Addendum)
Dr. Rhea Belton recommends you avoid NSAIDS.  If you have not heard from your PCP by this afternoon please call him concerning your rash.    Continue taking Zantac daily.  And follow up with Dr. Rhea Belton as needed.    NSAIDs and Peptic Ulcers A peptic ulcer is a defect that forms in the lining of the stomach or first part of the small intestine.  CAUSES  Most peptic ulcers are caused by infection with the bacterium H. pylori (Helicobacter pylori). Some peptic ulcers are caused by prolonged use of NSAIDs (nonsteroidal anti-inflammatory drugs). NSAIDs include aspirin, ibuprofen, and naproxen sodium. SYMPTOMS  An ulcer can cause:  A gnawing, burning pain in the upper abdomen.  Nausea.  Vomiting.  Loss of appetite.  Weight loss.  Fatigue. Normally the stomach has three defenses against digestive juices:   Mucus that coats the stomach lining and shields it from stomach acid.  The chemical bicarbonate that neutralizes stomach acid.  Blood circulation to the stomach lining that aids in cell renewal and repair. NSAIDs hinder all of these protective mechanisms. With the stomach's defenses down, digestive juices can damage the sensitive stomach lining and cause ulcers. TREATMENT   NSAID-induced ulcers usually heal once the person stops taking the medication. Your caregiver may recommend taking antacids to neutralize the acid. Antacids help the healing process and relieve symptoms. Drugs called H2-blockers or proton-pump inhibitors decrease the amount of acid the stomach produces. Medicines that protect the stomach lining also help with healing.  If a person with an NSAID ulcer also tests positive for H. pylori, he or she will be treated with antibiotics.  Surgery may be necessary if an ulcer recurs or fails to heal.  Surgery may also be necessary if complications like:  Severe bleeding.  Perforation.  Obstruction develop. Anyone taking NSAIDs who experiences symptoms of peptic ulcer  should see their caregiver for prompt treatment. Delaying diagnosis and treatment can lead to complications and the need for surgery. FOR MORE INFORMATION National Digestive Diseases Information Clearinghouse: http://digestive.EntertainmentBlogging.co.nz.htm Document Released: 12/17/2003 Document Revised: 04/04/2011 Document Reviewed: 12/26/2006 [facility] Patient Information 2013 [place], [place].                                                 We are excited to introduce MyChart, a new best-in-class service that provides you online access to important information in your electronic medical record. We want to make it easier for you to view your health information - all in one secure location - when and where you need it. We expect MyChart will enhance the quality of care and service we provide.  When you register for MyChart, you can:    View your test results.    Request appointments and receive appointment reminders via email.    Request medication renewals.    View your medical history, allergies, medications and immunizations.    Communicate with your physician's office through a password-protected site.    Conveniently print information such as your medication lists.  To find out if MyChart is right for you, please talk to a member of our clinical staff today. We will gladly answer your questions about this free health and wellness tool.  If you are age 77 or older and want a member of your family to have access to your record, you must provide written consent by completing  a proxy form available at our office. Please speak to our clinical staff about guidelines regarding accounts for patients younger than age 77.  As you activate your MyChart account and need any technical assistance, please call the MyChart technical support line at [phone]) or email your question to [email or web address]. If you email your question(s), please include your name, a return  phone number and the best time to reach you.  If you have non-urgent health-related questions, you can send a message to our office through MyChart at [email or web address]. If you have a medical emergency, call 911.  Thank you for using MyChart as your new health and wellness resource!   MyChart licensed from [employer],  [id number]. Patents Pending.

## 2012-11-08 NOTE — Progress Notes (Signed)
Subjective:    Patient ID: Kari [name], female    DOB: 1928-09-01, 79 y.o.   MRN: [id number]  HPI Ms. Cude is an 79 yo female with PMH of pulmonary and dermatologic sarcoidosis, asthma, osteoarthritis, history of gallstones status post cholecystectomy, reflux disease with hiatus hernia, atrial arrhythmia with palpitations, and H. pylori gastritis who is seen in follow-up. I last saw her in November 2013 after which time she had an upper endoscopy which was performed on 12/15/2011. 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 The duodenum in the examined portions was normal. Biopsies from the stomach revealed chronic active Helicobacter pylori gastritis without intestinal metaplasia or dysplasia. She did complete H. pylori therapy and this improved her epigastric abdominal pain. Today she returns for followup.   She reports she is overall feeling better, though she is getting over an inner ear infection  Which this was contributing to headaches for which she was using Motrin on a regular basis. She does feel like her stomach was irritated by the NSAID use, but she has maintained her Zantac 150 mg twice daily. She is not having significant heartburn. She also started drinking Ensure which she noticed calls early morning abdominal discomfort. She stopped using this nutritional supplement and this pain resolved and has not returned. Overall her constipation has improved. She reports her inner ear infection required antibiotics and  oral prednisone for 14 days. Recently she has noticed discomfort in her right mid back. This feels like a "electrical pain". She feels like this pain is more noticeable when she is tired and occasionally with eating. She denies significant nausea and no vomiting.  Her sister did recently die and she is dealing with this the best she can. She continues to have a very positive outlook. She has also recently moved  into an apartment, and selling her former home and moving has been stressful for her  Review of Systems As per history of present illness, otherwise negative  Current Medications, Allergies, Past Medical History, Past Surgical History, Family History and Social History were reviewed in [employer] record.     Objective:   Physical Exam BP 128/68  Pulse 69  Ht 5' 3.5" (1.613 m)  Wt 174 lb (78.926 kg)  BMI 30.34 kg/m2 Constitutional: Well-developed and well-nourished. No distress. HEENT: Normocephalic and atraumatic. Oropharynx is clear and moist. No oropharyngeal exudate. Conjunctivae are normal.  No scleral icterus. Neck: Neck supple. Trachea midline. Cardiovascular: Normal rate, regular rhythm and intact distal pulses. Pulmonary/chest: Effort normal and breath sounds normal. No wheezing, rales or rhonchi. Abdominal: Soft, nontender, nondistended. Bowel sounds active throughout.  Extremities: no clubbing, cyanosis, or edema Neurological: Alert and oriented to person place and time. Skin: Skin is warm and dry. On her back around T10 there is a erythema without vesicles or pustules, there is also extension of this redness towards the axillary line on the right along the same dermatome. There is a normal patch of intervening skin between the larger red patch and the smaller red streak  Psychiatric: Normal mood and affect. Behavior is normal.      Assessment & Plan:  79 yo female with PMH of pulmonary and dermatologic sarcoidosis, asthma, osteoarthritis, history of gallstones status post cholecystectomy, reflux disease with hiatus hernia, atrial arrhythmia with palpitations, and H. pylori gastritis who is seen in follow-up  1.  Hx of H. Pylori gastritis --she completed triple therapy for H. pylori gastritis and  had documented eradication. I expect she did have some gastric or possibly duodenal inflammation from the combination of NSAIDs and oral steroids. We have  discussed how this combination provides exponential risk of gastric and duodenal ulceration. Fortunately she is now off oral steroids. I've asked that she minimize her use of NSAIDs and try to use Tylenol as directed for headaches. She will continue Zantac 150 mg twice a day. She has previously been intolerant to several PPIs due to worsening of her headaches.  2.  Right upper back pain/rash -- this is concerning for an early recurrence of shingles. I have contacted her doctor, Dr. [name] in [place] to request that she be seen as soon as possible. She may benefit from antiviral therapy at this point as she has not developed pustules or vesicles.  Recurrent shingles would explain the "electrical" type pain she is currently experiencing, though fortunately to this point is not as severe as the previous pain she had during her first shingles outbreak.  She will followup as needed, and is advised to call me with any questions or concerns.

## 2012-11-08 NOTE — Telephone Encounter (Signed)
Per Dr Rhea Belton, called pt's PCP Dr [name] in [place], [place], [phone], to ask Dr Maryellen Pile to assess pt for possible return of Shingles.

## 2012-11-09 ENCOUNTER — Encounter: Payer: Self-pay | Admitting: Internal Medicine

## 2012-11-09 ENCOUNTER — Ambulatory Visit (INDEPENDENT_AMBULATORY_CARE_PROVIDER_SITE_OTHER): Payer: Medicare PPO | Admitting: Internal Medicine

## 2012-11-09 VITALS — BP 114/72 | HR 71 | Ht 63.0 in | Wt 175.8 lb

## 2012-11-09 DIAGNOSIS — D860 Sarcoidosis of lung: Secondary | ICD-10-CM

## 2012-11-09 DIAGNOSIS — D869 Sarcoidosis, unspecified: Secondary | ICD-10-CM

## 2012-11-09 DIAGNOSIS — J449 Chronic obstructive pulmonary disease, unspecified: Secondary | ICD-10-CM

## 2012-11-09 DIAGNOSIS — J99 Respiratory disorders in diseases classified elsewhere: Secondary | ICD-10-CM

## 2012-11-09 DIAGNOSIS — J309 Allergic rhinitis, unspecified: Secondary | ICD-10-CM

## 2012-11-09 DIAGNOSIS — J4489 Other specified chronic obstructive pulmonary disease: Secondary | ICD-10-CM

## 2012-11-09 MED ORDER — PHENYLEPHRINE HCL 1 % NA SOLN
3.0000 [drp] | Freq: Once | NASAL | Status: AC
  Administered 2012-11-09: 3 [drp] via NASAL

## 2012-11-09 NOTE — Patient Instructions (Signed)
Neb neo nasal  Try otc nasal saline gel (AYR or store brand) in each nostril as often as you want for dry nose  You can use the Combivent 1 puff, up to 4 times daily when needed  Use the Spiriva as an extra, only on bad days,  One per day

## 2012-11-09 NOTE — Progress Notes (Signed)
01/23/12- Dr Delton Coombes HPI 79 yo woman, former smoker 20 pk-yrs, hx childhood asthma, allergic rhinitis, A Fib, hiatal hernia seen by Dr Rhea Belton. She is a lifelong asthmatic, started to bother her more in her in the last 10 years. She was dx with sarcoidosis after bx of her skin at the [facility]. She has been found to have pulmonary nodules, never biopsied. Her most recent CT scan of the chest was in [place], [place]. She has had PFT at [facility]. Her breathing has been stable, has been taking Advair but she believes it is causing her LE pain, MSK pain.  Uses proventil prn.   ROV 11/21/11 -- 83 with hx longstanding asthma and sarcoidosis dx by skin bx. Seen recently in cardiology clinic for palpitations - ? Hx A fib, but documented PVC's. We stopped Advair last visit due to side effects, changed to symbicort. She was unable to use the Symbicort due to visual changes. She reports that she has been having nasal congestion and cough for the last week - ? Allergies, no known sick contacts. She has been on doxycycline 1 weekShe has been using SABA about 1-2x a day. Overall she believes her breathing is improved.  CT scan chest 10/14/09 [facility], [place]) >> hyperinflation, hiatal hernia, sub-cm pretracheal nodes, no obvious nodules.  ROV 01/23/12 -- asthma and sarcoidosis dx by skin bx. Has been difficult for her to tolerate LABA/ICS. Last time we tried QVAR - she developed A Fib 2 weeks later and stopped it. Has been using SABA about 2 x a day usually, has been using 4x a day last few days. She states that she has been more SOB x 3 weeks. Has had some nasal congestion, some rare GERD sx from her hiatal hernia.   04/13/12-  New Consult- Change from RB to CY at the patient's request. Here with friend.  72 yo woman, former smoker 20 pk-yrs, hx childhood asthma, allergic rhinitis, A Fib, hiatal hernia, sarcoid.   PCP Dr [name] in [place]. She gives a history that sarcoid was diagnosed many years ago by skin biopsy  presented with lung nodules and adenopathy. Nodules resolved after treatment with prednisone for 3 months. Never had lung biopsy. She has had repeated courses of prednisone for "allergy" in the 1980s. Now has osteoporosis. She had smoked one pack per day until stopping in 1975. She has had asthma since childhood, worse in the fall and spring. Advair and Symbicort caused myalgias in standard dose but she tolerates Symbicort 160 taking one puff twice daily. Today she feels well. Triggers include strong odors, virus colds and  seasonal changes. History of paroxysmal atrial fibrillation and hypertension. Her primary physician gave her Pneumovax.  05/25/12- Change from RB to CY at the patient's request. Here with friend. 86 yo woman, former smoker 20 pk-yrs, hx childhood asthma, allergic rhinitis, A Fib, hiatal hernia,   Hx sarcoid.   PCP Dr [name] in [place] FOLLOWS FOR: states that breathing is fine as long as she is using Symbicort; allergies have flared up at this time. Review PFT results with patient. She blames bronchodilators for myalgias in her legs-discussed. We discussed her history of steroid exposure. She wants clear to home with heavy exposure to Clorox. Little cough or wheeze now if she mows yards. Denies chest pain. History of sarcoid with adenopathy in the past. CXR 04/20/12 IMPRESSION:  Rounded prominence of the right hilum on the PA image is probably  unchanged compared with previous study. It is probably vascular  but I cannot exclude adenopathy. There is a hiatal hernia in the  retrocardiac region. No acute pulmonary infiltrative densities are  seen.  Original Report Authenticated By: Onalee Hua Call PFT 05-2012-moderate obstructive airways disease with little response to bronchodilator, air trapping, normal diffusion. FVC 2.34/97%, FEV1 1.32/81%, FEV1/FVC 0.56, RV 147%, DLCO 90%.  07/10/12- 62 yo woman, former smoker 20 pk-yrs, hx childhood asthma/ COPD, allergic rhinitis, A Fib,  hiatal hernia, Hx sarcoid.   PCP Dr [name] in [place] FOLLOWS FOR: slight congestion but feels it is going away; Sprivia working well but voice gets husky and dry. Pt states she is having severe dry mouth and had diarrhea this morning Breathing well. Spiriva causing dry mouth. Reflux precautions. Uses rescue inhaler occasionally. CT chest 05/29/12 IMPRESSION:  Normal chest CT. Pulmonary artery/vascular pedicle correspond to  the area of questioned right hilar fullness.  Small hiatal hernia.  Original Report Authenticated By: Christiana Pellant, M.D.  11/09/12- 15 yo woman, former smoker 20 pk-yrs, hx childhood asthma/ COPD, allergic rhinitis, A Fib, hiatal hernia, Hx sarcoid.   PCP Dr [name] in [place] FOLLOWS FOR: was doing well with breathing being on Spiriva; unsure if it has caused her to have dizzy and uneasy feelings. Feels that her nasal/sinus area has clogged up(pressure) Head congestion 1 month, ears won't pop. Finishing Zpak and pred taper from ENT.Flonase no help. Left off Spiriva- over dries. Was also using Combivent. Albuterol> palpitation. Sold her house, living in camper till moves to apartment in [place].  ROS-see HPI Constitutional:   No-   weight loss, night sweats, fevers, chills, fatigue, lassitude. HEENT:   No-  headaches, difficulty swallowing, tooth/dental problems, sore throat,       No-  sneezing, itching, ear ache, nasal congestion, post nasal drip,  CV:  No-   chest pain, orthopnea, PND, swelling in lower extremities, anasarca,                                                dizziness, + palpitations Resp: + shortness of breath with exertion or at rest.              No-   productive cough,  No non-productive cough,  No- coughing up of blood.              No-   change in color of mucus.  No- wheezing.   Skin: No-   rash or lesions. GI:  +heartburn, indigestion, abdominal pain, nausea, vomiting,  GU: . MS:  No-   joint pain or swelling.   Neuro-      nothing unusual Psych:  No- change in mood or affect. No depression or anxiety.  No memory loss.  OBJ- Physical Exam General- Alert, Oriented, Affect-appropriate, Distress- none acute Skin- rash-none, lesions- none, excoriation- none Lymphadenopathy- none Head- atraumatic            Eyes- Gross vision intact, PERRLA, conjunctivae and secretions clear            Ears- Hearing, canals-normal. TMs opacified with bilateral perforation "chronic"            Nose- +turbinate edema, no-Septal dev, mucus, polyps, erosion, perforation             Throat- Mallampati II , mucosa clear- not dry , drainage- none, tonsils- atrophic Neck- flexible , trachea midline, no  stridor , thyroid nl, carotid no bruit Chest - symmetrical excursion , unlabored           Heart/CV- RRR , no murmur , no gallop  , no rub, nl s1 s2                           - JVD- none , edema- none, stasis changes- none, varices- none           Lung- clear to P&A, wheeze- none, cough- none , dullness-none, rub- none           Chest wall-  Abd-  Br/ Gen/ Rectal- Not done, not indicated Extrem- cyanosis- none, clubbing, none, atrophy- none, strength- nl Neuro- grossly intact to observation

## 2012-11-25 DIAGNOSIS — J309 Allergic rhinitis, unspecified: Secondary | ICD-10-CM | POA: Insufficient documentation

## 2012-11-25 NOTE — Assessment & Plan Note (Signed)
Plan- neb nasal decongestant, saline gel

## 2012-11-25 NOTE — Assessment & Plan Note (Signed)
In remission.

## 2012-11-25 NOTE — Assessment & Plan Note (Signed)
Plan- use Combivent if needed as rescue inhaler. Use Spiriva only if bad stretch.

## 2012-12-07 ENCOUNTER — Telehealth: Payer: Self-pay | Admitting: Internal Medicine

## 2012-12-07 MED ORDER — FLUTICASONE FUROATE-VILANTEROL 100-25 MCG/INH IN AEPB: 1.0000 | INHALATION_SPRAY | Freq: Every day | RESPIRATORY_TRACT | Status: DC

## 2012-12-07 NOTE — Telephone Encounter (Signed)
Called, spoke with pt.  Reports she is taking Spiriva qd but feels it is making breathing worse and is affecting vision.  Also, reports she is needing to use proventil hfa 4-5 times bc the spriva isn't helping.  When she uses the Proventil, pt notices her face starts itching as soon as she uses it.  Reports this morning she woke up and her face is swollen and nose is red.  She has noticed increased SOB x 1 wk ( has panic attacks with these episodes at times) and noticed breathing worse this am with the swelling.  Denies trouble swallowing.  Pt also has combivent respimat on her med list.  Pt states she stopped using this because it didn't seem to carry her over as long as the proventil did.  I spoke with Dr. Maple Hudson regarding above.  He would like pt to stop the Spiriva and try a sample of Breo 1 puff once daily in its place.  She can use the abluterol hfa prn but should start to see the need to use it decrease if the [facility] stabilizes her breathing.    I spoke with pt regarding above.  She verbalized understanding.  Pt is aware I have placed 1 sample of the Breo at the front for pick up.  She will come by today to pick this up and be shown how to use the inhaler.  Advised pt to call back with an update on how the Virgel Bouquet is working for her.  Also, advised she is to seek emergency care, 911, if breathing worsens to the point she feels like she needs to be seen immediately, if swelling worsens, or if she starts having trouble swallowing.  She verbalized understanding of all recs and is in agreement with this plan.    Note:  Received override for the [facility] )pt has symbicort on allergy list that cause leg cramps).  Per Dr. Maple Hudson, he would still like to proceed with the [facility].

## 2012-12-17 ENCOUNTER — Ambulatory Visit (INDEPENDENT_AMBULATORY_CARE_PROVIDER_SITE_OTHER): Payer: Medicare PPO | Admitting: Internal Medicine

## 2012-12-17 ENCOUNTER — Encounter: Payer: Self-pay | Admitting: Internal Medicine

## 2012-12-17 ENCOUNTER — Other Ambulatory Visit (INDEPENDENT_AMBULATORY_CARE_PROVIDER_SITE_OTHER): Payer: Medicare PPO

## 2012-12-17 ENCOUNTER — Telehealth: Payer: Self-pay | Admitting: Internal Medicine

## 2012-12-17 VITALS — BP 180/82 | HR 74 | Ht 63.0 in | Wt 177.0 lb

## 2012-12-17 DIAGNOSIS — Z8719 Personal history of other diseases of the digestive system: Secondary | ICD-10-CM

## 2012-12-17 DIAGNOSIS — R1013 Epigastric pain: Secondary | ICD-10-CM

## 2012-12-17 LAB — COMPREHENSIVE METABOLIC PANEL
ALT: 14 U/L (ref 0–35)
AST: 16 U/L (ref 0–37)
Albumin: 4.1 g/dL (ref 3.5–5.2)
Alkaline Phosphatase: 47 U/L (ref 39–117)
BUN: 13 mg/dL (ref 6–23)
Calcium: 9.3 mg/dL (ref 8.4–10.5)
Chloride: 99 mEq/L (ref 96–112)
Potassium: 4.6 mEq/L (ref 3.5–5.1)
Sodium: 135 mEq/L (ref 135–145)
Total Protein: 7.2 g/dL (ref 6.0–8.3)

## 2012-12-17 LAB — CBC
MCHC: 34.2 g/dL (ref 30.0–36.0)
Platelets: 230 10*3/uL (ref 150.0–400.0)

## 2012-12-17 LAB — AMYLASE: Amylase: 44 U/L (ref 27–131)

## 2012-12-17 LAB — LIPASE: Lipase: 22 U/L (ref 11.0–59.0)

## 2012-12-17 MED ORDER — RABEPRAZOLE SODIUM 20 MG PO TBEC: 20.0000 mg | DELAYED_RELEASE_TABLET | Freq: Every day | ORAL | Status: DC

## 2012-12-17 MED ORDER — SUCRALFATE 1 GM/10ML PO SUSP: 1.0000 g | Freq: Three times a day (TID) | ORAL | Status: DC

## 2012-12-17 NOTE — Telephone Encounter (Signed)
Pt is aware to stop taking Breo and will come in office tomorrow and pick up sample of Dulera 100. Pt is to be taught how to use inhaler.  Nothing further needed

## 2012-12-17 NOTE — Telephone Encounter (Signed)
Offer to replace Breo with sample Dulera 100, 2 puffs then rinse mouth, twice daily

## 2012-12-17 NOTE — Telephone Encounter (Signed)
Pt states that Breo inhaler is causing her to have blurred vision, she can hardly see. Pt wants to know should she just stop taking or what should she do. Please advise CY

## 2012-12-17 NOTE — Progress Notes (Signed)
Subjective:    Patient ID: Kari [name], female    DOB: 11-19-1928, 79 y.o.   MRN: [id number]  HPI [name] is an 79 yo female with PMH of pulmonary and dermatologic sarcoidosis, asthma, osteoarthritis, history of gallstones status post cholecystectomy, reflux disease with hiatus hernia, atrial arrhythmia with palpitations, and H. pylori gastritis status post treatment who is seen in follow-up. I saw her in October 2014 at which time she was feeling well. She was continued on her ranitidine 150 mg twice daily for her GERD and dyspeptic symptoms, having been previously intolerant to Prevacid and Protonix. She reports over the last 2 weeks she's developed epigastric abdominal pain which is worse with eating. She wonders if this was due to moving heavy boxes at her home. She reports the pain just simply has not gone away despite her "waiting for it to go away".  She has reduced her diet to very soft and bland food such as cream of wheat and potato soup. She has noticed some slight increased regurgitation, but denies dysphagia. She tried TUMS and milk of magnesia. Milk of magnesia helped with mild constipation but not the pain. She reports the epigastric pain seems to radiate into her back. She denies dyspnea or chest pain. She was seen by pulmonology recently and began on BREO, but feels she will have to stop this due to slight increase in blood pressure, feeling "dried out in her sinuses" along with changes in her vision. She reported did help her breathing. Spiriva had previously caused similar issues for her was discontinued for the same reasons.    She does report she continues to deal with stress, at times a great deal for her. She lost her sister fairly recently. Her daughter's marriage is just fell and she reports her son-in-law has announced that he is homosexual.  She also is going to remove from her former home and into an apartment style living.   Review of Systems As per history of present  illness, otherwise negative  Current Medications, Allergies, Past Medical History, Past Surgical History, Family History and Social History were reviewed in [employer] record.     Objective:   Physical Exam BP 180/82  Pulse 74  Ht 5\' 3"  (1.6 m)  Wt 177 lb (80.287 kg)  BMI 31.36 kg/m2  SpO2 97% Constitutional: Well-developed and well-nourished. No distress. HEENT: Normocephalic and atraumatic. Oropharynx is clear and moist. No oropharyngeal exudate. Conjunctivae are normal.  No scleral icterus. Neck: Neck supple. Trachea midline. Cardiovascular: Normal rate, regular rhythm and intact distal pulses. Pulmonary/chest: Effort normal and breath sounds normal. No wheezing, rales or rhonchi. Abdominal: Soft, mild epigastric tenderness without rebound or guarding, nondistended. Bowel sounds active throughout.  Extremities: no clubbing, cyanosis, or edema Neurological: Alert and oriented to person place and time. Skin: Skin is warm and dry. No rashes noted. Psychiatric: Normal mood and affect. Behavior is normal.  CBC    Component Value Date/Time   WBC 5.0 12/17/2012 1548   RBC 4.07 12/17/2012 1548   HGB 12.6 12/17/2012 1548   HCT 36.8 12/17/2012 1548   PLT 230.0 12/17/2012 1548   MCV 90.4 12/17/2012 1548   MCHC 34.2 12/17/2012 1548   RDW 13.3 12/17/2012 1548    CMP     Component Value Date/Time   NA 135 12/17/2012 1548   K 4.6 12/17/2012 1548   CL 99 12/17/2012 1548   CO2 30 12/17/2012 1548   GLUCOSE 94 12/17/2012 1548   BUN 13  12/17/2012 1548   CREATININE 0.7 12/17/2012 1548   CREATININE 0.77 05/25/2012 1210   CALCIUM 9.3 12/17/2012 1548   PROT 7.2 12/17/2012 1548   ALBUMIN 4.1 12/17/2012 1548   AST 16 12/17/2012 1548   ALT 14 12/17/2012 1548   ALKPHOS 47 12/17/2012 1548   BILITOT 0.6 12/17/2012 1548    Lipase     Component Value Date/Time   LIPASE 22.0 12/17/2012 1548    CT scan - 11/2011 reviewed (large HH, diverticulosis with  diverticulitis)    Assessment & Plan:  79 yo female with PMH of pulmonary and dermatologic sarcoidosis, asthma, osteoarthritis, history of gallstones status post cholecystectomy, reflux disease with hiatus hernia, atrial arrhythmia with palpitations, and H. pylori gastritis status post treatment who is seen in follow-up.  1.  Epigastric pain -- her symptoms are dyspeptic in nature. She has been on nicotine 150 mg twice daily. Her labs were checked today and her white count is normal as is her hemoglobin. Metabolic panel and liver profile were normal. Lipase was normal. Based on history, pancreatitis was in the differential, but this is less likely with her normal labs.  She did have erosive gastritis at the time of her last endoscopy, but is status post H. pylori treatment with documentation of eradication.  She has been intolerant to PPIs in the past, but has never tried AcipHex. I would like to increase her acid suppression to try to help with her abdominal pain. I have prescribed AcipHex 20 mg daily. She will continue to use ranitidine 150 mg at bedtime for now. I will add Carafate 1 g 4 times a day. Finally, I will perform an upper GI series to evaluate her large hiatal hernia and to ensure there is no new paraesophageal component. If she fails to improve, further workup could include repeat cross-sectional imaging or EGD.  4-6 week follow-up.

## 2012-12-17 NOTE — Patient Instructions (Signed)
Your physician has requested that you go to the basement for the following lab work before leaving today: CBC, CMP, AMYLASE, LIPASE  You have been scheduled for an upper G.I series at [facility] on 12/25/2012 @ 10:00am please arrive at 9:45 have nothing to eat or drink after midnight.  If you need to cancel or reschedule this appointment please call [phone] Report to Radiology on the 1 st floor.   We have sent the following medications to your pharmacy for you to pick up at your convenience: Carafate, Aciphex and continue taking Zantac in the evening.                                               We are excited to introduce MyChart, a new best-in-class service that provides you online access to important information in your electronic medical record. We want to make it easier for you to view your health information - all in one secure location - when and where you need it. We expect MyChart will enhance the quality of care and service we provide.  When you register for MyChart, you can:    View your test results.    Request appointments and receive appointment reminders via email.    Request medication renewals.    View your medical history, allergies, medications and immunizations.    Communicate with your physician's office through a password-protected site.    Conveniently print information such as your medication lists.  To find out if MyChart is right for you, please talk to a member of our clinical staff today. We will gladly answer your questions about this free health and wellness tool.  If you are age 71 or older and want a member of your family to have access to your record, you must provide written consent by completing a proxy form available at our office. Please speak to our clinical staff about guidelines regarding accounts for patients younger than age 67.  As you activate your MyChart account and need any technical assistance, please call the MyChart  technical support line at [phone]) or email your question to [email or web address]. If you email your question(s), please include your name, a return phone number and the best time to reach you.  If you have non-urgent health-related questions, you can send a message to our office through MyChart at [email or web address]. If you have a medical emergency, call 911.  Thank you for using MyChart as your new health and wellness resource!   MyChart licensed from [employer],  [id number]. Patents Pending.

## 2012-12-17 NOTE — Telephone Encounter (Signed)
Pt is aware to have her vision checked if she continues to have blurred vision.

## 2012-12-17 NOTE — Telephone Encounter (Signed)
I called and spoke with pt. She had to stop spiriva last week d/t vision problems. She was started on breo. Pt reports this medication has her "head" messed up and thinks she is having swelling all over her body. She is needing an alternative. Please advise Dr. Maple Hudson thanks  Allergies  Allergen Reactions  . Ivp Dye [Iodinated Diagnostic Agents]     Pt states she thinks she had BP drop-unsure---Pre Treatment pt states she does fine  . Aspirin   . Codeine Other (See Comments)  . Other     Narcotics   . Penicillins   . Symbicort [Budesonide-Formoterol Fumarate]     Leg cramps

## 2012-12-17 NOTE — Telephone Encounter (Signed)
We have offered sample Dulera instead of Breo. If she still has vision concerns, she should see her eye doctor as soon as possiblel

## 2012-12-25 ENCOUNTER — Ambulatory Visit (HOSPITAL_COMMUNITY)
Admission: RE | Admit: 2012-12-25 | Discharge: 2012-12-25 | Disposition: A | Discharge status: Home / Self Care | Payer: Medicare PPO | Source: Ambulatory Visit | Attending: Internal Medicine | Admitting: Internal Medicine

## 2012-12-25 DIAGNOSIS — R1013 Epigastric pain: Secondary | ICD-10-CM

## 2013-01-02 ENCOUNTER — Other Ambulatory Visit (HOSPITAL_COMMUNITY): Payer: Medicare PPO

## 2013-01-10 ENCOUNTER — Ambulatory Visit (HOSPITAL_COMMUNITY)
Admission: RE | Admit: 2013-01-10 | Discharge: 2013-01-10 | Disposition: A | Discharge status: Home / Self Care | Payer: Medicare PPO | Source: Ambulatory Visit | Attending: Internal Medicine | Admitting: Internal Medicine

## 2013-01-10 DIAGNOSIS — K219 Gastro-esophageal reflux disease without esophagitis: Secondary | ICD-10-CM | POA: Insufficient documentation

## 2013-01-10 DIAGNOSIS — K5900 Constipation, unspecified: Secondary | ICD-10-CM | POA: Insufficient documentation

## 2013-01-10 DIAGNOSIS — K228 Other specified diseases of esophagus: Secondary | ICD-10-CM | POA: Insufficient documentation

## 2013-01-10 DIAGNOSIS — K449 Diaphragmatic hernia without obstruction or gangrene: Secondary | ICD-10-CM | POA: Insufficient documentation

## 2013-01-10 DIAGNOSIS — K2289 Other specified disease of esophagus: Secondary | ICD-10-CM | POA: Insufficient documentation

## 2013-01-10 DIAGNOSIS — K225 Diverticulum of esophagus, acquired: Secondary | ICD-10-CM | POA: Insufficient documentation

## 2013-01-10 DIAGNOSIS — R1310 Dysphagia, unspecified: Secondary | ICD-10-CM | POA: Insufficient documentation

## 2013-01-10 DIAGNOSIS — R1013 Epigastric pain: Secondary | ICD-10-CM | POA: Insufficient documentation

## 2013-01-14 ENCOUNTER — Other Ambulatory Visit: Payer: Self-pay | Admitting: Gastroenterology

## 2013-01-14 DIAGNOSIS — K449 Diaphragmatic hernia without obstruction or gangrene: Secondary | ICD-10-CM

## 2013-01-14 DIAGNOSIS — K2960 Other gastritis without bleeding: Secondary | ICD-10-CM

## 2013-01-14 DIAGNOSIS — R109 Unspecified abdominal pain: Secondary | ICD-10-CM

## 2013-01-14 DIAGNOSIS — K253 Acute gastric ulcer without hemorrhage or perforation: Secondary | ICD-10-CM

## 2013-01-14 DIAGNOSIS — K5900 Constipation, unspecified: Secondary | ICD-10-CM

## 2013-01-14 MED ORDER — RANITIDINE HCL 150 MG PO TABS: 150.0000 mg | ORAL_TABLET | Freq: Two times a day (BID) | ORAL | Status: DC

## 2013-02-27 ENCOUNTER — Inpatient Hospital Stay (HOSPITAL_COMMUNITY)
Admission: EM | Admit: 2013-02-27 | Discharge: 2013-03-08 | DRG: 208 | Disposition: A | Discharge status: Skilled Nursing Facility | Payer: No Typology Code available for payment source | Attending: General Surgery | Admitting: General Surgery

## 2013-02-27 ENCOUNTER — Inpatient Hospital Stay (HOSPITAL_COMMUNITY): Payer: No Typology Code available for payment source

## 2013-02-27 ENCOUNTER — Encounter (HOSPITAL_COMMUNITY): Payer: Self-pay | Admitting: Radiology

## 2013-02-27 ENCOUNTER — Emergency Department (HOSPITAL_COMMUNITY): Payer: No Typology Code available for payment source

## 2013-02-27 DIAGNOSIS — S2241XA Multiple fractures of ribs, right side, initial encounter for closed fracture: Secondary | ICD-10-CM | POA: Diagnosis present

## 2013-02-27 DIAGNOSIS — W19XXXA Unspecified fall, initial encounter: Secondary | ICD-10-CM | POA: Diagnosis present

## 2013-02-27 DIAGNOSIS — Z87891 Personal history of nicotine dependence: Secondary | ICD-10-CM

## 2013-02-27 DIAGNOSIS — Z79899 Other long term (current) drug therapy: Secondary | ICD-10-CM

## 2013-02-27 DIAGNOSIS — S225XXA Flail chest, initial encounter for closed fracture: Principal | ICD-10-CM | POA: Diagnosis present

## 2013-02-27 DIAGNOSIS — S2249XA Multiple fractures of ribs, unspecified side, initial encounter for closed fracture: Secondary | ICD-10-CM

## 2013-02-27 DIAGNOSIS — R350 Frequency of micturition: Secondary | ICD-10-CM | POA: Diagnosis not present

## 2013-02-27 DIAGNOSIS — D62 Acute posthemorrhagic anemia: Secondary | ICD-10-CM | POA: Diagnosis present

## 2013-02-27 DIAGNOSIS — S2220XA Unspecified fracture of sternum, initial encounter for closed fracture: Secondary | ICD-10-CM | POA: Diagnosis present

## 2013-02-27 DIAGNOSIS — F411 Generalized anxiety disorder: Secondary | ICD-10-CM | POA: Diagnosis not present

## 2013-02-27 DIAGNOSIS — S8000XA Contusion of unspecified knee, initial encounter: Secondary | ICD-10-CM | POA: Diagnosis present

## 2013-02-27 DIAGNOSIS — R339 Retention of urine, unspecified: Secondary | ICD-10-CM | POA: Diagnosis not present

## 2013-02-27 DIAGNOSIS — J4489 Other specified chronic obstructive pulmonary disease: Secondary | ICD-10-CM | POA: Diagnosis present

## 2013-02-27 DIAGNOSIS — I10 Essential (primary) hypertension: Secondary | ICD-10-CM | POA: Diagnosis present

## 2013-02-27 DIAGNOSIS — J9600 Acute respiratory failure, unspecified whether with hypoxia or hypercapnia: Secondary | ICD-10-CM | POA: Diagnosis present

## 2013-02-27 DIAGNOSIS — S27329A Contusion of lung, unspecified, initial encounter: Secondary | ICD-10-CM | POA: Diagnosis present

## 2013-02-27 DIAGNOSIS — E2749 Other adrenocortical insufficiency: Secondary | ICD-10-CM | POA: Diagnosis present

## 2013-02-27 DIAGNOSIS — Z9911 Dependence on respirator [ventilator] status: Secondary | ICD-10-CM

## 2013-02-27 DIAGNOSIS — E871 Hypo-osmolality and hyponatremia: Secondary | ICD-10-CM | POA: Diagnosis not present

## 2013-02-27 DIAGNOSIS — J449 Chronic obstructive pulmonary disease, unspecified: Secondary | ICD-10-CM | POA: Diagnosis present

## 2013-02-27 DIAGNOSIS — F410 Panic disorder [episodic paroxysmal anxiety] without agoraphobia: Secondary | ICD-10-CM | POA: Diagnosis not present

## 2013-02-27 DIAGNOSIS — E876 Hypokalemia: Secondary | ICD-10-CM | POA: Diagnosis not present

## 2013-02-27 HISTORY — DX: Chronic obstructive pulmonary disease, unspecified: J44.9

## 2013-02-27 HISTORY — DX: Disorder of thyroid, unspecified: E07.9

## 2013-02-27 HISTORY — DX: Age-related osteoporosis without current pathological fracture: M81.0

## 2013-02-27 HISTORY — DX: Sarcoidosis, unspecified: D86.9

## 2013-02-27 HISTORY — DX: Old myocardial infarction: I25.2

## 2013-02-27 LAB — COMPREHENSIVE METABOLIC PANEL
ALBUMIN: 3.6 g/dL (ref 3.5–5.2)
ALK PHOS: 56 U/L (ref 39–117)
ALT: 68 U/L — AB (ref 0–35)
AST: 95 U/L — ABNORMAL HIGH (ref 0–37)
BILIRUBIN TOTAL: 0.3 mg/dL (ref 0.3–1.2)
BUN: 23 mg/dL (ref 6–23)
CHLORIDE: 94 meq/L — AB (ref 96–112)
CO2: 27 mEq/L (ref 19–32)
Calcium: 8.9 mg/dL (ref 8.4–10.5)
Creatinine, Ser: 0.79 mg/dL (ref 0.50–1.10)
GFR calc Af Amer: 87 mL/min — ABNORMAL LOW (ref >90)
GFR calc non Af Amer: 75 mL/min — ABNORMAL LOW (ref >90)
Glucose, Bld: 155 mg/dL — ABNORMAL HIGH (ref 70–99)
POTASSIUM: 4.3 meq/L (ref 3.7–5.3)
Sodium: 134 mEq/L — ABNORMAL LOW (ref 137–147)
Total Protein: 6.9 g/dL (ref 6.0–8.3)

## 2013-02-27 LAB — POCT I-STAT 3, ART BLOOD GAS (G3+)
Acid-Base Excess: 3 mmol/L — ABNORMAL HIGH (ref 0.0–2.0)
BICARBONATE: 28.3 meq/L — AB (ref 20.0–24.0)
O2 Saturation: 100 %
PH ART: 7.416 (ref 7.350–7.450)
TCO2: 30 mmol/L (ref 0–100)
pCO2 arterial: 44.1 mmHg (ref 35.0–45.0)
pO2, Arterial: 381 mmHg — ABNORMAL HIGH (ref 80.0–100.0)

## 2013-02-27 LAB — TYPE AND SCREEN
ABO/RH(D): A POS
ANTIBODY SCREEN: NEGATIVE
UNIT DIVISION: 0
UNIT DIVISION: 0

## 2013-02-27 LAB — CBC
HCT: 34.4 % — ABNORMAL LOW (ref 36.0–46.0)
Hemoglobin: 12.1 g/dL (ref 12.0–15.0)
MCH: 31.4 pg (ref 26.0–34.0)
MCHC: 35.2 g/dL (ref 30.0–36.0)
MCV: 89.4 fL (ref 78.0–100.0)
Platelets: 237 10*3/uL (ref 150–400)
RBC: 3.85 MIL/uL — ABNORMAL LOW (ref 3.87–5.11)
RDW: 12.7 % (ref 11.5–15.5)
WBC: 15.3 10*3/uL — AB (ref 4.0–10.5)

## 2013-02-27 LAB — ABO/RH: ABO/RH(D): A POS

## 2013-02-27 LAB — POCT I-STAT, CHEM 8
BUN: 23 mg/dL (ref 6–23)
CALCIUM ION: 1.16 mmol/L (ref 1.13–1.30)
Chloride: 93 mEq/L — ABNORMAL LOW (ref 96–112)
Creatinine, Ser: 0.9 mg/dL (ref 0.50–1.10)
GLUCOSE: 159 mg/dL — AB (ref 70–99)
HCT: 37 % (ref 36.0–46.0)
HEMOGLOBIN: 12.6 g/dL (ref 12.0–15.0)
POTASSIUM: 4 meq/L (ref 3.7–5.3)
Sodium: 132 mEq/L — ABNORMAL LOW (ref 137–147)
TCO2: 29 mmol/L (ref 0–100)

## 2013-02-27 LAB — CG4 I-STAT (LACTIC ACID): Lactic Acid, Venous: 0.82 mmol/L (ref 0.5–2.2)

## 2013-02-27 LAB — CDS SEROLOGY

## 2013-02-27 MED ORDER — ETOMIDATE 2 MG/ML IV SOLN
INTRAVENOUS | Status: AC
  Filled 2013-02-27: qty 20

## 2013-02-27 MED ORDER — SUCCINYLCHOLINE CHLORIDE 20 MG/ML IJ SOLN
INTRAMUSCULAR | Status: AC | PRN
  Administered 2013-02-27: 120 mg via INTRAVENOUS

## 2013-02-27 MED ORDER — IOHEXOL 300 MG/ML  SOLN
80.0000 mL | Freq: Once | INTRAMUSCULAR | Status: AC | PRN
  Administered 2013-02-27: 80 mL via INTRAVENOUS

## 2013-02-27 MED ORDER — SODIUM CHLORIDE 0.9 % IV SOLN
0.0000 ug/h | INTRAVENOUS | Status: DC
  Administered 2013-02-27: 500 ug/h via INTRAVENOUS
  Filled 2013-02-27: qty 50

## 2013-02-27 MED ORDER — FENTANYL CITRATE 0.05 MG/ML IJ SOLN
50.0000 ug | Freq: Once | INTRAMUSCULAR | Status: AC
Start: 2013-02-27 — End: 2013-02-27
  Administered 2013-02-27: 100 ug via INTRAVENOUS

## 2013-02-27 MED ORDER — ETOMIDATE 2 MG/ML IV SOLN
INTRAVENOUS | Status: AC | PRN
  Administered 2013-02-27: 25 mg via INTRAVENOUS

## 2013-02-27 MED ORDER — ROCURONIUM BROMIDE 50 MG/5ML IV SOLN
INTRAVENOUS | Status: AC
  Filled 2013-02-27: qty 2

## 2013-02-27 MED ORDER — SUCCINYLCHOLINE CHLORIDE 20 MG/ML IJ SOLN
INTRAMUSCULAR | Status: AC
  Filled 2013-02-27: qty 1

## 2013-02-27 MED ORDER — FENTANYL BOLUS VIA INFUSION
25.0000 ug | INTRAVENOUS | Status: DC | PRN
  Filled 2013-02-27: qty 50

## 2013-02-27 MED ORDER — PROPOFOL 10 MG/ML IV EMUL
5.0000 ug/kg/min | INTRAVENOUS | Status: DC
  Administered 2013-02-27: 10 ug/kg/min via INTRAVENOUS
  Filled 2013-02-27: qty 100

## 2013-02-27 MED ORDER — LIDOCAINE HCL (CARDIAC) 20 MG/ML IV SOLN
INTRAVENOUS | Status: AC
  Filled 2013-02-27: qty 5

## 2013-02-27 NOTE — ED Notes (Signed)
The pts purse and another bag given to the daughter millette wall. Bag contents not inventoried
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
## 2013-02-27 NOTE — ED Notes (Signed)
Preparing for intubation ok'd by pt.

## 2013-02-27 NOTE — ED Notes (Signed)
Pt to CT with RN and RT accompanying, all gtts continue to infuse.

## 2013-02-27 NOTE — Progress Notes (Signed)
Right mainstem intubation, ETT pulled back 3cm to 20 @ the lip.

## 2013-02-27 NOTE — ED Notes (Addendum)
Notified RN Britta Mccreedy(Barbara) of the istat lactic Acid and Chem 8

## 2013-02-28 ENCOUNTER — Inpatient Hospital Stay (HOSPITAL_COMMUNITY): Payer: No Typology Code available for payment source | Admitting: Certified Registered"

## 2013-02-28 ENCOUNTER — Encounter (HOSPITAL_COMMUNITY): Payer: Self-pay | Admitting: Emergency Medicine

## 2013-02-28 ENCOUNTER — Encounter (HOSPITAL_COMMUNITY): Payer: No Typology Code available for payment source | Admitting: Certified Registered"

## 2013-02-28 ENCOUNTER — Encounter (HOSPITAL_COMMUNITY): Payer: No Typology Code available for payment source | Admitting: Anesthesiology

## 2013-02-28 ENCOUNTER — Inpatient Hospital Stay (HOSPITAL_COMMUNITY): Payer: No Typology Code available for payment source | Admitting: Anesthesiology

## 2013-02-28 ENCOUNTER — Inpatient Hospital Stay (HOSPITAL_COMMUNITY): Payer: No Typology Code available for payment source

## 2013-02-28 LAB — CBC
HCT: 32 % — ABNORMAL LOW (ref 36.0–46.0)
Hemoglobin: 11.5 g/dL — ABNORMAL LOW (ref 12.0–15.0)
MCH: 32.3 pg (ref 26.0–34.0)
MCHC: 35.9 g/dL (ref 30.0–36.0)
MCV: 89.9 fL (ref 78.0–100.0)
Platelets: 200 10*3/uL (ref 150–400)
RBC: 3.56 MIL/uL — ABNORMAL LOW (ref 3.87–5.11)
RDW: 12.7 % (ref 11.5–15.5)
WBC: 14.2 10*3/uL — ABNORMAL HIGH (ref 4.0–10.5)

## 2013-02-28 LAB — BASIC METABOLIC PANEL
BUN: 20 mg/dL (ref 6–23)
CO2: 24 mEq/L (ref 19–32)
Calcium: 8.6 mg/dL (ref 8.4–10.5)
Chloride: 93 mEq/L — ABNORMAL LOW (ref 96–112)
Creatinine, Ser: 0.68 mg/dL (ref 0.50–1.10)
GFR, EST NON AFRICAN AMERICAN: 79 mL/min — AB (ref >90)
Glucose, Bld: 140 mg/dL — ABNORMAL HIGH (ref 70–99)
POTASSIUM: 3.9 meq/L (ref 3.7–5.3)
SODIUM: 133 meq/L — AB (ref 137–147)

## 2013-02-28 LAB — MRSA PCR SCREENING: MRSA BY PCR: NEGATIVE

## 2013-02-28 LAB — TRIGLYCERIDES: Triglycerides: 62 mg/dL (ref <150)

## 2013-02-28 MED ORDER — SODIUM CHLORIDE 0.9 % IV SOLN
INTRAVENOUS | Status: DC
  Administered 2013-02-28 – 2013-03-01 (×3): via INTRAVENOUS

## 2013-02-28 MED ORDER — FENTANYL CITRATE 0.05 MG/ML IJ SOLN: 50.0000 ug | INTRAMUSCULAR | Status: DC | PRN

## 2013-02-28 MED ORDER — MIDAZOLAM HCL 2 MG/2ML IJ SOLN: 1.0000 mg | INTRAMUSCULAR | Status: DC | PRN

## 2013-02-28 MED ORDER — ONDANSETRON HCL 4 MG PO TABS
4.0000 mg | ORAL_TABLET | Freq: Four times a day (QID) | ORAL | Status: DC | PRN
  Filled 2013-02-28: qty 1

## 2013-02-28 MED ORDER — CHLORHEXIDINE GLUCONATE 0.12 % MT SOLN
15.0000 mL | Freq: Two times a day (BID) | OROMUCOSAL | Status: DC
  Administered 2013-02-28 – 2013-03-01 (×4): 15 mL via OROMUCOSAL
  Filled 2013-02-28 (×4): qty 15

## 2013-02-28 MED ORDER — ROPIVACAINE HCL 2 MG/ML IJ SOLN: 8.0000 mL/h | INTRAMUSCULAR | Status: DC

## 2013-02-28 MED ORDER — ALBUMIN HUMAN 5 % IV SOLN
12.5000 g | Freq: Once | INTRAVENOUS | Status: AC
  Administered 2013-02-28: 12.5 g via INTRAVENOUS
  Filled 2013-02-28: qty 250

## 2013-02-28 MED ORDER — LIDOCAINE-EPINEPHRINE (PF) 1 %-1:200000 IJ SOLN
30.0000 mL | Freq: Once | INTRAMUSCULAR | Status: AC
  Administered 2013-02-28: 3 mL via INTRADERMAL
  Filled 2013-02-28: qty 30

## 2013-02-28 MED ORDER — LIDOCAINE HCL (CARDIAC) 20 MG/ML IV SOLN
INTRAVENOUS | Status: AC
  Filled 2013-02-28: qty 5

## 2013-02-28 MED ORDER — ENOXAPARIN SODIUM 30 MG/0.3ML ~~LOC~~ SOLN
30.0000 mg | Freq: Two times a day (BID) | SUBCUTANEOUS | Status: DC
  Filled 2013-02-28 (×2): qty 0.3

## 2013-02-28 MED ORDER — ACETAMINOPHEN 160 MG/5ML PO SOLN
650.0000 mg | Freq: Four times a day (QID) | ORAL | Status: DC | PRN
  Administered 2013-02-28 – 2013-03-01 (×2): 650 mg
  Filled 2013-02-28 (×2): qty 20.3

## 2013-02-28 MED ORDER — PANTOPRAZOLE SODIUM 40 MG PO TBEC
40.0000 mg | DELAYED_RELEASE_TABLET | Freq: Every day | ORAL | Status: DC
  Administered 2013-03-02 – 2013-03-05 (×4): 40 mg via ORAL
  Filled 2013-02-28 (×4): qty 1

## 2013-02-28 MED ORDER — ONDANSETRON HCL 4 MG/2ML IJ SOLN
4.0000 mg | Freq: Three times a day (TID) | INTRAMUSCULAR | Status: DC | PRN
  Administered 2013-03-02 – 2013-03-08 (×11): 4 mg via INTRAVENOUS
  Filled 2013-02-28 (×11): qty 2

## 2013-02-28 MED ORDER — IPRATROPIUM-ALBUTEROL 0.5-2.5 (3) MG/3ML IN SOLN
3.0000 mL | Freq: Four times a day (QID) | RESPIRATORY_TRACT | Status: DC
  Administered 2013-02-28 – 2013-03-05 (×23): 3 mL via RESPIRATORY_TRACT
  Filled 2013-02-28 (×24): qty 3

## 2013-02-28 MED ORDER — GUAIFENESIN 100 MG/5ML PO SOLN
5.0000 mL | Freq: Four times a day (QID) | ORAL | Status: DC
  Administered 2013-02-28 – 2013-03-04 (×12): 100 mg
  Filled 2013-02-28 (×39): qty 5

## 2013-02-28 MED ORDER — AMLODIPINE BESYLATE 2.5 MG PO TABS
2.5000 mg | ORAL_TABLET | Freq: Every day | ORAL | Status: DC
  Administered 2013-03-01 – 2013-03-08 (×8): 2.5 mg
  Filled 2013-02-28 (×9): qty 1

## 2013-02-28 MED ORDER — SODIUM CHLORIDE 0.9 % IV SOLN: INTRAVENOUS | Status: DC

## 2013-02-28 MED ORDER — PROPOFOL 10 MG/ML IV EMUL
0.0000 ug/kg/min | INTRAVENOUS | Status: DC
  Administered 2013-02-28 – 2013-03-01 (×2): 20 ug/kg/min via INTRAVENOUS
  Filled 2013-02-28 (×3): qty 100

## 2013-02-28 MED ORDER — SODIUM CHLORIDE 0.9 % IV SOLN
INTRAVENOUS | Status: DC
  Administered 2013-02-28: 15:00:00 via EPIDURAL
  Filled 2013-02-28 (×5): qty 100

## 2013-02-28 MED ORDER — ONDANSETRON HCL 4 MG/2ML IJ SOLN
4.0000 mg | Freq: Four times a day (QID) | INTRAMUSCULAR | Status: DC | PRN
  Administered 2013-03-01 (×2): 4 mg via INTRAVENOUS
  Filled 2013-02-28 (×2): qty 2

## 2013-02-28 MED ORDER — BIOTENE DRY MOUTH MT LIQD
15.0000 mL | Freq: Four times a day (QID) | OROMUCOSAL | Status: DC
  Administered 2013-02-28 – 2013-03-01 (×5): 15 mL via OROMUCOSAL

## 2013-02-28 MED ORDER — PANTOPRAZOLE SODIUM 40 MG IV SOLR
40.0000 mg | Freq: Every day | INTRAVENOUS | Status: DC
  Administered 2013-02-28 – 2013-03-01 (×2): 40 mg via INTRAVENOUS
  Filled 2013-02-28 (×2): qty 40
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
## 2013-02-28 NOTE — Anesthesia Procedure Notes (Signed)
Epidural Patient location during procedure: ICU Start time: 02/28/2013 2:35 PM End time: 02/28/2013 2:56 PM  Staffing Anesthesiologist: Ramzi Brathwaite, CHRIS Performed by: anesthesiologist   Preanesthetic Checklist Completed: patient identified, site marked, surgical consent, pre-op evaluation, timeout performed, IV checked, risks and benefits discussed and monitors and equipment checked  Epidural Patient position: left lateral decubitus Prep: ChloraPrep Patient monitoring: heart rate, cardiac monitor, continuous pulse ox and blood pressure Approach: midline Injection technique: LOR saline  Needle:  Needle type: Tuohy  Needle gauge: 18 G Needle length: 9 cm Needle insertion depth: 6 cm Catheter type: closed end flexible Catheter size: 19 Gauge Catheter at skin depth: 15 cm Test dose: negative and Other (3ml 1% Lido with 1:200 epi)  Assessment Events: blood not aspirated, injection not painful, no injection resistance, negative IV test and no paresthesia  Additional Notes Secured with sterile dressing, VSS, ropiv 0.2% started at 438ml/hrReason for block:procedure for pain

## 2013-02-28 NOTE — ED Notes (Signed)
[facility]
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
## 2013-02-28 NOTE — Progress Notes (Signed)
UR completed.  Zamyiah Tino, RN BSN MHA CCM Trauma/Neuro ICU Case Manager [phone]  

## 2013-02-28 NOTE — Anesthesia Postprocedure Evaluation (Signed)
  Anesthesia Post-op Note  Patient: Kari [name]  Procedure(s) Performed: Thoracic epidural  Patient remains intubated with improved pain control, Vent weaned, VSS, No complications.

## 2013-03-01 ENCOUNTER — Inpatient Hospital Stay (HOSPITAL_COMMUNITY): Payer: No Typology Code available for payment source

## 2013-03-01 DIAGNOSIS — S27329A Contusion of lung, unspecified, initial encounter: Secondary | ICD-10-CM | POA: Diagnosis present

## 2013-03-01 DIAGNOSIS — W19XXXA Unspecified fall, initial encounter: Secondary | ICD-10-CM | POA: Diagnosis present

## 2013-03-01 DIAGNOSIS — J9600 Acute respiratory failure, unspecified whether with hypoxia or hypercapnia: Secondary | ICD-10-CM

## 2013-03-01 DIAGNOSIS — J449 Chronic obstructive pulmonary disease, unspecified: Secondary | ICD-10-CM | POA: Diagnosis present

## 2013-03-01 DIAGNOSIS — D62 Acute posthemorrhagic anemia: Secondary | ICD-10-CM

## 2013-03-01 DIAGNOSIS — S2220XA Unspecified fracture of sternum, initial encounter for closed fracture: Secondary | ICD-10-CM | POA: Diagnosis present

## 2013-03-01 DIAGNOSIS — S2241XA Multiple fractures of ribs, right side, initial encounter for closed fracture: Secondary | ICD-10-CM | POA: Diagnosis present

## 2013-03-01 DIAGNOSIS — E2749 Other adrenocortical insufficiency: Secondary | ICD-10-CM | POA: Diagnosis present

## 2013-03-01 LAB — BLOOD GAS, ARTERIAL
ACID-BASE EXCESS: 1.2 mmol/L (ref 0.0–2.0)
BICARBONATE: 25.3 meq/L — AB (ref 20.0–24.0)
Drawn by: 275531
FIO2: 0.4 %
Mode: POSITIVE
O2 Saturation: 98.7 %
PATIENT TEMPERATURE: 98.6
PEEP: 5 cmH2O
PRESSURE SUPPORT: 5 cmH2O
TCO2: 26.6 mmol/L (ref 0–100)
pCO2 arterial: 40.5 mmHg (ref 35.0–45.0)
pH, Arterial: 7.413 (ref 7.350–7.450)
pO2, Arterial: 110 mmHg — ABNORMAL HIGH (ref 80.0–100.0)

## 2013-03-01 LAB — CBC
HCT: 25.6 % — ABNORMAL LOW (ref 36.0–46.0)
Hemoglobin: 8.9 g/dL — ABNORMAL LOW (ref 12.0–15.0)
MCH: 31.7 pg (ref 26.0–34.0)
MCHC: 34.8 g/dL (ref 30.0–36.0)
MCV: 91.1 fL (ref 78.0–100.0)
PLATELETS: 143 10*3/uL — AB (ref 150–400)
RBC: 2.81 MIL/uL — AB (ref 3.87–5.11)
RDW: 13.5 % (ref 11.5–15.5)
WBC: 6.3 10*3/uL (ref 4.0–10.5)

## 2013-03-01 LAB — BASIC METABOLIC PANEL
BUN: 14 mg/dL (ref 6–23)
CHLORIDE: 99 meq/L (ref 96–112)
CO2: 22 meq/L (ref 19–32)
Calcium: 7.6 mg/dL — ABNORMAL LOW (ref 8.4–10.5)
Creatinine, Ser: 0.68 mg/dL (ref 0.50–1.10)
GFR calc non Af Amer: 79 mL/min — ABNORMAL LOW (ref >90)
Glucose, Bld: 135 mg/dL — ABNORMAL HIGH (ref 70–99)
Potassium: 3.3 mEq/L — ABNORMAL LOW (ref 3.7–5.3)
Sodium: 135 mEq/L — ABNORMAL LOW (ref 137–147)

## 2013-03-01 LAB — HEMOGLOBIN AND HEMATOCRIT, BLOOD
HCT: 26.9 % — ABNORMAL LOW (ref 36.0–46.0)
Hemoglobin: 9.3 g/dL — ABNORMAL LOW (ref 12.0–15.0)

## 2013-03-01 MED ORDER — LORAZEPAM 2 MG/ML IJ SOLN
INTRAMUSCULAR | Status: AC
  Administered 2013-03-01: 0.5 mg via INTRAVENOUS
  Filled 2013-03-01: qty 1

## 2013-03-01 MED ORDER — LORAZEPAM 2 MG/ML IJ SOLN
0.5000 mg | Freq: Four times a day (QID) | INTRAMUSCULAR | Status: DC | PRN
  Administered 2013-03-01 – 2013-03-02 (×4): 0.5 mg via INTRAVENOUS
  Administered 2013-03-03 (×2): 1 mg via INTRAVENOUS
  Filled 2013-03-01 (×5): qty 1

## 2013-03-01 MED ORDER — ROPIVACAINE HCL 2 MG/ML IJ SOLN
8.0000 mL/h | INTRAMUSCULAR | Status: DC
  Filled 2013-03-01: qty 200

## 2013-03-01 MED ORDER — KETOROLAC TROMETHAMINE 30 MG/ML IJ SOLN
30.0000 mg | Freq: Once | INTRAMUSCULAR | Status: AC
  Administered 2013-03-01: 30 mg via INTRAVENOUS
  Filled 2013-03-01: qty 1

## 2013-03-01 MED ORDER — KETOROLAC TROMETHAMINE 15 MG/ML IJ SOLN
15.0000 mg | Freq: Four times a day (QID) | INTRAMUSCULAR | Status: DC
  Filled 2013-03-01 (×4): qty 1

## 2013-03-01 MED ORDER — FUROSEMIDE 10 MG/ML IJ SOLN
INTRAMUSCULAR | Status: AC
  Filled 2013-03-01: qty 4

## 2013-03-01 MED ORDER — LORAZEPAM 2 MG/ML IJ SOLN
INTRAMUSCULAR | Status: AC
  Filled 2013-03-01: qty 1

## 2013-03-01 MED ORDER — FUROSEMIDE 10 MG/ML IJ SOLN
20.0000 mg | Freq: Once | INTRAMUSCULAR | Status: AC
  Administered 2013-03-01: 20 mg via INTRAVENOUS

## 2013-03-01 MED ORDER — ROPIVACAINE HCL 2 MG/ML IJ SOLN
10.0000 mL/h | INTRAMUSCULAR | Status: DC
  Administered 2013-03-01: 8 mL/h via EPIDURAL
  Administered 2013-03-02 – 2013-03-04 (×5): 10 mL/h via EPIDURAL
  Filled 2013-03-01 (×13): qty 200

## 2013-03-01 MED ORDER — OXYCODONE-ACETAMINOPHEN 5-325 MG PO TABS
1.0000 | ORAL_TABLET | ORAL | Status: DC | PRN
  Administered 2013-03-02: 2 via ORAL
  Administered 2013-03-02 – 2013-03-03 (×3): 1 via ORAL
  Filled 2013-03-01: qty 2
  Filled 2013-03-01: qty 1
  Filled 2013-03-01: qty 2
  Filled 2013-03-01 (×4): qty 1

## 2013-03-01 MED ORDER — KETOROLAC TROMETHAMINE 15 MG/ML IJ SOLN
15.0000 mg | Freq: Four times a day (QID) | INTRAMUSCULAR | Status: AC
  Administered 2013-03-01 – 2013-03-03 (×7): 15 mg via INTRAVENOUS
  Filled 2013-03-01 (×10): qty 1

## 2013-03-01 MED ORDER — ACETAMINOPHEN 500 MG PO TABS
1000.0000 mg | ORAL_TABLET | Freq: Four times a day (QID) | ORAL | Status: DC | PRN
  Administered 2013-03-01 – 2013-03-04 (×5): 1000 mg via ORAL
  Filled 2013-03-01 (×6): qty 2

## 2013-03-01 NOTE — Progress Notes (Signed)
Pt having lots of anxiety. Spoke to Dr. Corliss Skainssuei and will try small dose of ativan to help with some of the anxiety.

## 2013-03-01 NOTE — Procedures (Signed)
Extubation Procedure Note  Patient Details:   Name: Kari [name] DOB: 1928/02/22 MRN: [id number]   Airway Documentation:     Evaluation  O2 sats: stable throughout Complications: No apparent complications Patient did tolerate procedure well. Bilateral Breath Sounds: Clear;Diminished Suctioning:  (Not needed at this time.) Yes Pt able to verbalize placed on 4L [place] [address] MoosCampbell, Faviola Klare Faulkner 03/01/2013, 10:47 AM
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
## 2013-03-02 MED ORDER — SODIUM CHLORIDE 0.9 % IV BOLUS (SEPSIS)
1000.0000 mL | Freq: Once | INTRAVENOUS | Status: AC
  Administered 2013-03-02: 1000 mL via INTRAVENOUS

## 2013-03-02 MED ORDER — ACETAMINOPHEN 325 MG PO TABS: 650.0000 mg | ORAL_TABLET | Freq: Four times a day (QID) | ORAL | Status: DC | PRN

## 2013-03-02 MED ORDER — LEVOTHYROXINE SODIUM 112 MCG PO TABS
112.0000 ug | ORAL_TABLET | Freq: Every day | ORAL | Status: DC
  Administered 2013-03-03 – 2013-03-08 (×6): 112 ug via ORAL
  Filled 2013-03-02 (×7): qty 1

## 2013-03-02 MED ORDER — DEXTROSE-NACL 5-0.9 % IV SOLN
INTRAVENOUS | Status: DC
  Administered 2013-03-02 – 2013-03-03 (×2): via INTRAVENOUS

## 2013-03-02 MED ORDER — MOMETASONE FURO-FORMOTEROL FUM 100-5 MCG/ACT IN AERO
2.0000 | INHALATION_SPRAY | Freq: Two times a day (BID) | RESPIRATORY_TRACT | Status: DC
  Administered 2013-03-03 – 2013-03-06 (×7): 2 via RESPIRATORY_TRACT
  Filled 2013-03-02 (×2): qty 8.8

## 2013-03-02 MED ORDER — HYDROCHLOROTHIAZIDE 12.5 MG PO CAPS
12.5000 mg | ORAL_CAPSULE | Freq: Every day | ORAL | Status: DC
  Administered 2013-03-03 – 2013-03-08 (×6): 12.5 mg via ORAL
  Filled 2013-03-02 (×7): qty 1

## 2013-03-02 MED ORDER — SODIUM CHLORIDE 0.9 % IJ SOLN: 3.0000 mL | INTRAMUSCULAR | Status: DC | PRN

## 2013-03-02 NOTE — Progress Notes (Signed)
Trauma Service Note  Subjective: Patient doing well.  Sitting up in chair. Increase in epidural has helped.    Objective: Vital signs in last 24 hours: Temp:  [96.6 F (35.9 C)-101.1 F (38.4 C)] 99.5 F (37.5 C) (02/07 0900) Pulse Rate:  [69-115] 83 (02/07 0900) Resp:  [16-33] 24 (02/07 0900) BP: (91-154)/(39-93) 141/71 mmHg (02/07 0900) SpO2:  [91 %-100 %] 97 % (02/07 0942)    Intake/Output from previous day: 02/06 0701 - 02/07 0700 In: 1425 [I.V.:1425] Out: 1110 [Urine:1110] Intake/Output this shift: Total I/O In: 100 [I.V.:100] Out: -   General: Better, no acute distress.  Lungs: Edmon CrapeStoll with flail to the left chest, but tolerating it well.  Abd: Benign  Extremities: No changes.  Has gotten out of bed with PT.  Neuro: Intact  Lab Results: CBC   Recent Labs  02/28/13 0132 03/01/13 0212 03/01/13 1226  WBC 14.2* 6.3  --   HGB 11.5* 8.9* 9.3*  HCT 32.0* 25.6* 26.9*  PLT 200 143*  --    BMET  Recent Labs  02/28/13 0132 03/01/13 0212  NA 133* 135*  K 3.9 3.3*  CL 93* 99  CO2 24 22  GLUCOSE 140* 135*  BUN 20 14  CREATININE 0.68 0.68  CALCIUM 8.6 7.6*   PT/INR No results found for this basename: LABPROT, INR,  in the last 72 hours ABG  Recent Labs  02/27/13 2345 03/01/13 1025  PHART 7.416 7.413  HCO3 28.3* 25.3*    Studies/Results: Dg Chest Port 1 View  03/01/2013   CLINICAL DATA:  Multiple rib fractures.  EXAM: PORTABLE CHEST - 1 VIEW  COMPARISON:  This are prior  FINDINGS: Right-sided rib fractures appear little change. Endotracheal tube tip is 23 mm from the carina. Enteric tube remains present. Monitoring leads project over the chest. Right basilar atelectasis has increased compared to yesterday's exam. Cardiopericardial silhouette unchanged. Tortuous thoracic aorta with arch atherosclerosis. Small bilateral pleural effusions with blunting of the costophrenic angles.  IMPRESSION: Increasing atelectasis the right base. Stable support  apparatus. No other interval change in the chest.   Electronically Signed   By: Andreas NewportGeoffrey  Lamke M.D.   On: 03/01/2013 07:46    Anti-infectives: Anti-infectives   None      Assessment/Plan: s/p  Advance diet Transfer to SDU with epidural catheter Saline lock IVF Rehab consultation.  LOS: 3 days   Marta LamasJames O. Gae BonWyatt, III, MD, FACS [phone] Trauma Surgeon 03/02/2013

## 2013-03-02 NOTE — Progress Notes (Signed)
Weekend CSW attempted to assess patient for SNF and complete SBIRT but patient had several visitors at this time. CSW will assess at a later time.   Samuella BruinKristin Ania Levay, MSW, LCSWA Clinical Social Worker [facility]. [phone]
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
## 2013-03-02 NOTE — Progress Notes (Signed)
Epidural Check:  Anxiety during the night noted.  Some increase pain this am.  Vital signs stable, able to get OOB yesterday.  Site clear, dressing intact. No weakness or other neurologic signs noted. Plan to increase rate to 10 cc/hour, and continue present management through the weekend. Consider transitioning off epidural early next week as directed by the trauma service.

## 2013-03-03 ENCOUNTER — Inpatient Hospital Stay (HOSPITAL_COMMUNITY): Payer: No Typology Code available for payment source

## 2013-03-03 LAB — URINALYSIS, ROUTINE W REFLEX MICROSCOPIC
GLUCOSE, UA: NEGATIVE mg/dL
HGB URINE DIPSTICK: NEGATIVE
Ketones, ur: NEGATIVE mg/dL
Leukocytes, UA: NEGATIVE
Nitrite: NEGATIVE
PROTEIN: NEGATIVE mg/dL
SPECIFIC GRAVITY, URINE: 1.023 (ref 1.005–1.030)
Urobilinogen, UA: 0.2 mg/dL (ref 0.0–1.0)
pH: 6 (ref 5.0–8.0)

## 2013-03-03 LAB — BASIC METABOLIC PANEL
BUN: 15 mg/dL (ref 6–23)
CHLORIDE: 96 meq/L (ref 96–112)
CO2: 21 mEq/L (ref 19–32)
Calcium: 7.7 mg/dL — ABNORMAL LOW (ref 8.4–10.5)
Creatinine, Ser: 0.62 mg/dL (ref 0.50–1.10)
GFR, EST NON AFRICAN AMERICAN: 82 mL/min — AB (ref >90)
Glucose, Bld: 125 mg/dL — ABNORMAL HIGH (ref 70–99)
POTASSIUM: 3.9 meq/L (ref 3.7–5.3)
Sodium: 129 mEq/L — ABNORMAL LOW (ref 137–147)

## 2013-03-03 LAB — CBC
HEMATOCRIT: 25.1 % — AB (ref 36.0–46.0)
Hemoglobin: 8.8 g/dL — ABNORMAL LOW (ref 12.0–15.0)
MCH: 32.1 pg (ref 26.0–34.0)
MCHC: 35.1 g/dL (ref 30.0–36.0)
MCV: 91.6 fL (ref 78.0–100.0)
Platelets: 135 10*3/uL — ABNORMAL LOW (ref 150–400)
RBC: 2.74 MIL/uL — ABNORMAL LOW (ref 3.87–5.11)
RDW: 13.2 % (ref 11.5–15.5)
WBC: 6.1 10*3/uL (ref 4.0–10.5)

## 2013-03-03 LAB — CULTURE, RESPIRATORY: SPECIAL REQUESTS: NORMAL

## 2013-03-03 MED ORDER — LORAZEPAM 2 MG/ML IJ SOLN
1.0000 mg | Freq: Once | INTRAMUSCULAR | Status: AC
  Administered 2013-03-03: 1 mg via INTRAVENOUS
  Filled 2013-03-03: qty 1

## 2013-03-03 MED ORDER — TRAMADOL HCL 50 MG PO TABS
100.0000 mg | ORAL_TABLET | Freq: Four times a day (QID) | ORAL | Status: DC
  Administered 2013-03-03 – 2013-03-04 (×4): 100 mg via ORAL
  Administered 2013-03-05: 50 mg via ORAL
  Filled 2013-03-03 (×6): qty 2

## 2013-03-03 MED ORDER — LORAZEPAM 2 MG/ML IJ SOLN
0.5000 mg | Freq: Two times a day (BID) | INTRAMUSCULAR | Status: DC | PRN
  Administered 2013-03-04 – 2013-03-05 (×3): 1 mg via INTRAVENOUS
  Filled 2013-03-03 (×2): qty 1
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
## 2013-03-03 NOTE — Progress Notes (Signed)
Patient noted to not have urinated since removal of foley catheter at 0900 03/02/2013. Patient state that she did not feel the need to urinate. Bladder scan performed revealing 79cc's of urine. Dr. Carolynne Edouardoth notified and ordered 1000 nacl bolus followed by 100cc/hour of d5ns. Patient attempted to pee three hours later with no results. Bladder was scanned and 200 cc's were found. Will continue to monitor patient.

## 2013-03-03 NOTE — Progress Notes (Signed)
Trauma Service Note  Subjective: Patient seems exhausted this AM.  No acute distress.  Has not voided since yesterday. AM.  Did get a bolus of saline.  Objective: Vital signs in last 24 hours: Temp:  [97.6 F (36.4 C)-99.5 F (37.5 C)] 97.6 F (36.4 C) (02/08 0700) Pulse Rate:  [71-101] 90 (02/08 0740) Resp:  [19-36] 26 (02/08 0740) BP: (81-141)/(41-82) 136/61 mmHg (02/08 0740) SpO2:  [88 %-100 %] 97 % (02/08 0740) FiO2 (%):  [95 %-98 %] 98 % (02/08 0113) Last BM Date: 02/27/13  Intake/Output from previous day: 02/07 0701 - 02/08 0700 In: 2391.7 [P.O.:1200; I.V.:1191.7] Out: 300 [Urine:300] Intake/Output this shift:    General: Sleepy and not acute distress.  Lungs: Shallow, heaving, not struggling, Oxygen saturations 94% on 4L by [place]  Abd: Soft and benign  Extremities: No changes  Neuro: Sleepy and cannot arouse.  Got Ativan not too long ago.  Lab Results: CBC   Recent Labs  03/01/13 0212 03/01/13 1226  WBC 6.3  --   HGB 8.9* 9.3*  HCT 25.6* 26.9*  PLT 143*  --    BMET  Recent Labs  03/01/13 0212  NA 135*  K 3.3*  CL 99  CO2 22  GLUCOSE 135*  BUN 14  CREATININE 0.68  CALCIUM 7.6*   PT/INR No results found for this basename: LABPROT, INR,  in the last 72 hours ABG  Recent Labs  03/01/13 1025  PHART 7.413  HCO3 25.3*    Studies/Results: No results found.  Anti-infectives: Anti-infectives   None      Assessment/Plan: s/p  Venti mask for mouth breathing. Discontinue toradol Foley to gravity Scheduled tramadol   LOS: 4 days   Marta LamasJames O. Gae BonWyatt, III, MD, FACS [phone] Trauma Surgeon 03/03/2013

## 2013-03-03 NOTE — Progress Notes (Signed)
Epidural Check: Pt. resting comfortably. VSS. Epidural site clean with no evidence of infection, dressing intact. Will continue epidural today with plans to d/c early this week.

## 2013-03-03 NOTE — Progress Notes (Addendum)
Clinical Social Work Department CLINICAL SOCIAL WORK PLACEMENT NOTE 03/03/2013  Patient:  Kari MarrowCHESHIRE,ELKA  Account Number:  [id number] Admit date:  02/27/2013  Clinical Social Worker:  Samuella BruinKRISTIN DRINKARD, Theresia MajorsLCSWA  Date/time:  03/03/2013 09:58 AM  Clinical Social Work is seeking post-discharge placement for this patient at the following level of care:   SKILLED NURSING   (*CSW will update this form in Epic as items are completed)     Patient/family provided with [facility] of Clinical Social Work's list of facilities offering this level of care within the geographic area requested by the patient (or if unable, by the patient's family).  03/03/2013  Patient/family informed of their freedom to choose among providers that offer the needed level of care, that participate in Medicare, Medicaid or managed care program needed by the patient, have an available bed and are willing to accept the patient.    Patient/family informed of [facility]' ownership interest in [facility], as well as of the fact that they are under no obligation to receive care at this facility.  PASARR submitted to EDS on  03/03/2013 PASARR number received from EDS on 03/03/2013  FL2 transmitted to all facilities in geographic area requested by pt/family on  03/03/2013 FL2 transmitted to all facilities within larger geographic area on   Patient informed that his/her managed care company has contracts with or will negotiate with  certain facilities, including the following:     Patient/family informed of bed offers received:  03/05/2013 [facility]  [facility] Physician recommends and patient chooses bed at    [facility]   [facility]  The following physician request were entered in Epic:   Additional Comments:  Samuella BruinKristin Drinkard, MSW, LCSWA Clinical Social Worker [facility]  Dept. [phone]

## 2013-03-03 NOTE — Progress Notes (Signed)
Dr. Tsuei notified of pt's BP aCorliss Skainsnd that BUN and Creatinine are within normal limits. Patient more alert but is currently short of breath (RT in room giving pt breathing treatment). Plan of care -- Give Norvasc that was scheduled for this morning. Renette ButtersGolden, Viona Gilmorehristy Lee

## 2013-03-03 NOTE — Progress Notes (Signed)
Epidural Check:  Patient remains intubated. Awakes to voice. Acknowledges adequate pain control with epidural. Patient anxious with PMH of anxiety. Vital signs stable. Site clear, dressing intact. No weakness or other neurologic signs noted.   Assessment and Plan: Adequate pain control. Continue epidural infusion at 538ml/hr. Discussed multimodal pain control with Trauma service including scheduled Tylenol and Toradol. Consider anxiolytics. Once extubated patient my get OOB and ambulate if continues to show no numbness or weakness in the lower extremities.

## 2013-03-04 ENCOUNTER — Inpatient Hospital Stay (HOSPITAL_COMMUNITY): Payer: No Typology Code available for payment source

## 2013-03-04 LAB — BASIC METABOLIC PANEL
BUN: 10 mg/dL (ref 6–23)
CALCIUM: 7.7 mg/dL — AB (ref 8.4–10.5)
CO2: 23 meq/L (ref 19–32)
CREATININE: 0.56 mg/dL (ref 0.50–1.10)
Chloride: 95 mEq/L — ABNORMAL LOW (ref 96–112)
GFR calc Af Amer: >90 mL/min (ref >90)
GFR calc non Af Amer: 85 mL/min — ABNORMAL LOW (ref >90)
GLUCOSE: 128 mg/dL — AB (ref 70–99)
Potassium: 3.8 mEq/L (ref 3.7–5.3)
Sodium: 129 mEq/L — ABNORMAL LOW (ref 137–147)

## 2013-03-04 LAB — CBC WITH DIFFERENTIAL/PLATELET
Basophils Absolute: 0 10*3/uL (ref 0.0–0.1)
Basophils Relative: 0 % (ref 0–1)
EOS PCT: 2 % (ref 0–5)
Eosinophils Absolute: 0.1 10*3/uL (ref 0.0–0.7)
HEMATOCRIT: 22.8 % — AB (ref 36.0–46.0)
Hemoglobin: 7.9 g/dL — ABNORMAL LOW (ref 12.0–15.0)
LYMPHS ABS: 0.3 10*3/uL — AB (ref 0.7–4.0)
Lymphocytes Relative: 6 % — ABNORMAL LOW (ref 12–46)
MCH: 31.6 pg (ref 26.0–34.0)
MCHC: 34.6 g/dL (ref 30.0–36.0)
MCV: 91.2 fL (ref 78.0–100.0)
MONO ABS: 0.8 10*3/uL (ref 0.1–1.0)
Monocytes Relative: 16 % — ABNORMAL HIGH (ref 3–12)
Neutro Abs: 3.9 10*3/uL (ref 1.7–7.7)
Neutrophils Relative %: 75 % (ref 43–77)
Platelets: 153 10*3/uL (ref 150–400)
RBC: 2.5 MIL/uL — AB (ref 3.87–5.11)
RDW: 13.1 % (ref 11.5–15.5)
WBC: 5.2 10*3/uL (ref 4.0–10.5)

## 2013-03-04 LAB — BLOOD GAS, ARTERIAL
Acid-Base Excess: 0.5 mmol/L (ref 0.0–2.0)
Bicarbonate: 25 mEq/L — ABNORMAL HIGH (ref 20.0–24.0)
O2 CONTENT: 3.5 L/min
O2 SAT: 95.1 %
PCO2 ART: 42.9 mmHg (ref 35.0–45.0)
PH ART: 7.383 (ref 7.350–7.450)
Patient temperature: 98.6
TCO2: 26.3 mmol/L (ref 0–100)
pO2, Arterial: 73.7 mmHg — ABNORMAL LOW (ref 80.0–100.0)

## 2013-03-04 MED ORDER — ALBUTEROL SULFATE (2.5 MG/3ML) 0.083% IN NEBU
2.5000 mg | INHALATION_SOLUTION | RESPIRATORY_TRACT | Status: DC | PRN
  Administered 2013-03-04 – 2013-03-07 (×3): 2.5 mg via RESPIRATORY_TRACT
  Filled 2013-03-04 (×4): qty 3

## 2013-03-04 NOTE — Progress Notes (Signed)
Rehab Admissions Coordinator Note:  Patient was screened by Trish MageLogue, Deasia Chiu M for appropriateness for an Inpatient Acute Rehab Consult.  Noted PT recommending SNF.  At this time, we are recommending Skilled Nursing Facility.  Lelon FrohlichLogue, Kojo Liby M 03/04/2013, 9:34 AM  I can be reached at [phone].
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
## 2013-03-04 NOTE — Anesthesia Procedure Notes (Deleted)
Procedures

## 2013-03-04 NOTE — Progress Notes (Signed)
Trauma Service Note  Subjective: Patient sitting up in bedside chair, not really in distress, but more labored than what I would like to see.  Objective: Vital signs in last 24 hours: Temp:  [97.8 F (36.6 C)-98.5 F (36.9 C)] 98.5 F (36.9 C) (02/09 0809) Pulse Rate:  [80-104] 83 (02/09 0404) Resp:  [21-29] 22 (02/09 0404) BP: (96-186)/(52-91) 100/52 mmHg (02/09 0404) SpO2:  [93 %-99 %] 93 % (02/09 0624) Last BM Date:  (PTA)  Intake/Output from previous day: 02/08 0701 - 02/09 0700 In: 3230 [P.O.:720; I.V.:2510] Out: 1450 [Urine:1450] Intake/Output this shift: Total I/O In: 100 [I.V.:100] Out: 225 [Urine:225]  General: Awake and oriented, says she   Lungs: Decreased breath sounds especially on the right side.  Coarse coughing without much production.  IS only up to 250  Abd: Benign  Extremities: No changes  Neuro: Intact   Lab Results: CBC   Recent Labs  03/03/13 1155 03/04/13 0320  WBC 6.1 5.2  HGB 8.8* 7.9*  HCT 25.1* 22.8*  PLT 135* 153   BMET  Recent Labs  03/03/13 1155 03/04/13 0320  NA 129* 129*  K 3.9 3.8  CL 96 95*  CO2 21 23  GLUCOSE 125* 128*  BUN 15 10  CREATININE 0.62 0.56  CALCIUM 7.7* 7.7*   PT/INR No results found for this basename: LABPROT, INR,  in the last 72 hours ABG  Recent Labs  03/01/13 1025  PHART 7.413  HCO3 25.3*    Studies/Results: Dg Chest Port 1 View  03/03/2013   CLINICAL DATA:  Flail chest with rib fractures  EXAM: PORTABLE CHEST - 1 VIEW  COMPARISON:  03/01/2013  FINDINGS: Cardiac shadow is enlarged but stable. The endotracheal tube and nasogastric catheter been removed in the interval. Increasing right-sided pleural effusion is noted. Underlying atelectasis is likely present as well. Mild left basilar atelectasis is seen.  IMPRESSION: Increasing atelectasis within enlarging right-sided pleural effusion.   Electronically Signed   By: Alcide CleverMark  Lukens M.D.   On: 03/03/2013 11:44     Anti-infectives: Anti-infectives   None      Assessment/Plan: s/p  Patient getting into more trouble pulmonary wise. Will get ABG. Flutter valve.   LOS: 5 days   Marta LamasJames O. Gae BonWyatt, III, MD, FACS [phone] Trauma Surgeon 03/04/2013

## 2013-03-04 NOTE — Progress Notes (Signed)
Epidural Check: Pt having minimal pain. VSS. Dressing clean. Discussed with Dr Lindie SpruceWyatt. Will continue.  Lonia SkinnerKevin D Bradee Common MD

## 2013-03-05 ENCOUNTER — Encounter (HOSPITAL_COMMUNITY): Payer: Self-pay | Admitting: Anesthesiology

## 2013-03-05 LAB — CBC WITH DIFFERENTIAL/PLATELET
BASOS PCT: 1 % (ref 0–1)
Basophils Absolute: 0 10*3/uL (ref 0.0–0.1)
Eosinophils Absolute: 0.1 10*3/uL (ref 0.0–0.7)
Eosinophils Relative: 2 % (ref 0–5)
HCT: 22.3 % — ABNORMAL LOW (ref 36.0–46.0)
Hemoglobin: 7.9 g/dL — ABNORMAL LOW (ref 12.0–15.0)
LYMPHS ABS: 0.5 10*3/uL — AB (ref 0.7–4.0)
Lymphocytes Relative: 13 % (ref 12–46)
MCH: 31.6 pg (ref 26.0–34.0)
MCHC: 35.4 g/dL (ref 30.0–36.0)
MCV: 89.2 fL (ref 78.0–100.0)
Monocytes Absolute: 0.6 10*3/uL (ref 0.1–1.0)
Monocytes Relative: 16 % — ABNORMAL HIGH (ref 3–12)
Neutro Abs: 2.7 10*3/uL (ref 1.7–7.7)
Neutrophils Relative %: 68 % (ref 43–77)
Platelets: 160 10*3/uL (ref 150–400)
RBC: 2.5 MIL/uL — AB (ref 3.87–5.11)
RDW: 12.8 % (ref 11.5–15.5)
WBC: 3.9 10*3/uL — ABNORMAL LOW (ref 4.0–10.5)

## 2013-03-05 LAB — EXPECTORATED SPUTUM ASSESSMENT W GRAM STAIN, RFLX TO RESP C: Special Requests: NORMAL

## 2013-03-05 MED ORDER — ENSURE COMPLETE PO LIQD
237.0000 mL | Freq: Two times a day (BID) | ORAL | Status: DC
  Administered 2013-03-05 – 2013-03-07 (×5): 237 mL via ORAL

## 2013-03-05 MED ORDER — DOCUSATE SODIUM 100 MG PO CAPS
100.0000 mg | ORAL_CAPSULE | Freq: Two times a day (BID) | ORAL | Status: DC
  Administered 2013-03-05 – 2013-03-06 (×4): 100 mg via ORAL
  Filled 2013-03-05 (×7): qty 1

## 2013-03-05 MED ORDER — TRAMADOL HCL 50 MG PO TABS
50.0000 mg | ORAL_TABLET | ORAL | Status: DC
  Administered 2013-03-05 – 2013-03-06 (×4): 50 mg via ORAL
  Filled 2013-03-05 (×6): qty 1

## 2013-03-05 MED ORDER — ALPRAZOLAM 0.5 MG PO TABS
0.5000 mg | ORAL_TABLET | Freq: Three times a day (TID) | ORAL | Status: DC | PRN
  Administered 2013-03-05 – 2013-03-06 (×2): 0.5 mg via ORAL
  Filled 2013-03-05 (×2): qty 1

## 2013-03-05 MED ORDER — SODIUM CHLORIDE 1 G PO TABS
1.0000 g | ORAL_TABLET | Freq: Three times a day (TID) | ORAL | Status: DC
  Administered 2013-03-05 – 2013-03-08 (×9): 1 g via ORAL
  Filled 2013-03-05 (×12): qty 1

## 2013-03-05 MED ORDER — POLYETHYLENE GLYCOL 3350 17 G PO PACK
17.0000 g | PACK | Freq: Every day | ORAL | Status: DC
  Administered 2013-03-05 – 2013-03-08 (×4): 17 g via ORAL
  Filled 2013-03-05 (×4): qty 1

## 2013-03-05 MED ORDER — FENTANYL CITRATE 0.05 MG/ML IJ SOLN: 25.0000 ug | INTRAMUSCULAR | Status: DC | PRN

## 2013-03-05 MED ORDER — IPRATROPIUM-ALBUTEROL 0.5-2.5 (3) MG/3ML IN SOLN
3.0000 mL | Freq: Four times a day (QID) | RESPIRATORY_TRACT | Status: DC
  Administered 2013-03-06 – 2013-03-07 (×5): 3 mL via RESPIRATORY_TRACT
  Filled 2013-03-05 (×5): qty 3

## 2013-03-05 MED ORDER — HYDROCODONE-ACETAMINOPHEN 10-325 MG PO TABS
0.5000 | ORAL_TABLET | ORAL | Status: DC | PRN
  Administered 2013-03-05: 0.5 via ORAL
  Filled 2013-03-05: qty 1

## 2013-03-05 MED ORDER — PAROXETINE HCL 10 MG PO TABS
10.0000 mg | ORAL_TABLET | Freq: Every day | ORAL | Status: DC
  Administered 2013-03-05 – 2013-03-07 (×3): 10 mg via ORAL
  Filled 2013-03-05 (×4): qty 1

## 2013-03-05 NOTE — Progress Notes (Signed)
Stable.  Alter meds and see how she does.

## 2013-03-05 NOTE — Progress Notes (Signed)
PT Cancellation Note  Patient Details Name: Berniece Andreaslka XXX Hillis MRN: [id number] DOB: 01-26-1928   Cancelled Treatment:    Reason Eval/Treat Not Completed: Other (comment). Pt politely declined until later today due to just receiving pain medicine and increased lethargy. Pt educated on benefits of OOB mobility and how just receiving pain medicines would be a good opportunity to mobilize however pt still chose to defer until later today. PT to return as able.   Marcene BrawnChadwell, Briget Shaheed Marie 03/05/2013, 8:39 AM

## 2013-03-05 NOTE — Progress Notes (Signed)
Pt to TX to 5N-06, VSS, called report. Family present & aware.

## 2013-03-05 NOTE — Progress Notes (Signed)
Epidural catheter d/c by Dr. Diamantina MonksGreg Smith, anesthesiologist; bedside. Catheter intact.

## 2013-03-05 NOTE — Progress Notes (Signed)
Patient ID: Kari [name], female   DOB: Mar 22, 1928, 80 y.o.   MRN: [id number]   LOS: 6 days   Subjective: Feeling better today. Still having problems with panic attacks. Daughter wanted to know if there was something else we could try as the Ativan isn't as effective as they would like. Also worried about decreased appetite, wants to try some Ensure.   Objective: Vital signs in last 24 hours: Temp:  [97.5 F (36.4 C)-99.4 F (37.4 C)] 97.8 F (36.6 C) (02/10 0811) Pulse Rate:  [85-92] 87 (02/10 0811) Resp:  [17-28] 17 (02/10 0811) BP: (140-154)/(69-78) 140/70 mmHg (02/10 0811) SpO2:  [95 %-98 %] 98 % (02/10 0811) Last BM Date:  (PTA)   IS: 500ml   Laboratory  CBC  Recent Labs  03/04/13 0320 03/05/13 0220  WBC 5.2 3.9*  HGB 7.9* 7.9*  HCT 22.8* 22.3*  PLT 153 160    Physical Exam General appearance: alert and no distress Resp: diminished breath sounds anterior - right Cardio: regular rate and rhythm GI: normal findings: bowel sounds normal and soft, non-tender   Assessment/Plan: Fall Sternal fx Multiple right rib fxs w/pulm contusion -- Pulmonary toilet. Will stop intrathecal fentanyl and see how things go, may be able to d/c. Adrenal hemorrhage ABL anemia -- Stable Anxiety d/o -- Will try Xanax instead of Ativan, add Paxil for long-term control as I expect this problem will go on for at least a month or two if not longer COPD -- Home meds FEN -- Add Ensure. SL IV and add NaCl tabs for hyponatremia VTE -- SCD's Dispo -- Continue SDU with epidural catheter    Freeman CaldronMichael J. Sylvana Bonk, PA-C Pager: [phone] General Trauma PA Pager: [phone]   03/05/2013

## 2013-03-05 NOTE — Clinical Social Work Note (Signed)
Clinical Social Worker continuing to follow patient and family for support and discharge planning needs.  CSW spoke with patient and patient daughter at bedside to provide [place] of [place] potential bed offers.  Patient daughter plans to look into facilities tomorrow and notify CSW of top 3 choices.  CSW will await [employer] authorization until facility chosen and patient closer to medical readiness.  CSW remains available for support and to facilitate patient discharge needs once medically stable.  Macario GoldsJesse Neima Lacross, Kentucky[place]CSW [phone]

## 2013-03-06 ENCOUNTER — Inpatient Hospital Stay (HOSPITAL_COMMUNITY): Payer: No Typology Code available for payment source

## 2013-03-06 DIAGNOSIS — E876 Hypokalemia: Secondary | ICD-10-CM

## 2013-03-06 DIAGNOSIS — S27329A Contusion of lung, unspecified, initial encounter: Secondary | ICD-10-CM

## 2013-03-06 LAB — BASIC METABOLIC PANEL
BUN: 6 mg/dL (ref 6–23)
CALCIUM: 8.2 mg/dL — AB (ref 8.4–10.5)
CO2: 33 meq/L — AB (ref 19–32)
Chloride: 89 mEq/L — ABNORMAL LOW (ref 96–112)
Creatinine, Ser: 0.52 mg/dL (ref 0.50–1.10)
GFR calc Af Amer: >90 mL/min (ref >90)
GFR calc non Af Amer: 87 mL/min — ABNORMAL LOW (ref >90)
Glucose, Bld: 102 mg/dL — ABNORMAL HIGH (ref 70–99)
Potassium: 3.4 mEq/L — ABNORMAL LOW (ref 3.7–5.3)
SODIUM: 131 meq/L — AB (ref 137–147)

## 2013-03-06 MED ORDER — FAMOTIDINE 20 MG PO TABS
20.0000 mg | ORAL_TABLET | Freq: Two times a day (BID) | ORAL | Status: DC
  Administered 2013-03-06 – 2013-03-08 (×4): 20 mg via ORAL
  Filled 2013-03-06 (×5): qty 1

## 2013-03-06 MED ORDER — ACETAMINOPHEN 325 MG PO TABS
650.0000 mg | ORAL_TABLET | Freq: Four times a day (QID) | ORAL | Status: DC | PRN
  Administered 2013-03-06 – 2013-03-07 (×4): 650 mg via ORAL
  Filled 2013-03-06 (×4): qty 2

## 2013-03-06 MED ORDER — ALPRAZOLAM 0.25 MG PO TABS
0.2500 mg | ORAL_TABLET | Freq: Three times a day (TID) | ORAL | Status: DC | PRN
  Administered 2013-03-06 – 2013-03-07 (×2): 0.25 mg via ORAL
  Filled 2013-03-06 (×2): qty 1

## 2013-03-06 MED ORDER — POTASSIUM CHLORIDE CRYS ER 20 MEQ PO TBCR
20.0000 meq | EXTENDED_RELEASE_TABLET | Freq: Every day | ORAL | Status: DC
  Administered 2013-03-06: 20 meq via ORAL
  Filled 2013-03-06 (×2): qty 1

## 2013-03-06 MED ORDER — ENOXAPARIN SODIUM 40 MG/0.4ML ~~LOC~~ SOLN
40.0000 mg | SUBCUTANEOUS | Status: DC
  Administered 2013-03-06 – 2013-03-07 (×2): 40 mg via SUBCUTANEOUS
  Filled 2013-03-06 (×3): qty 0.4
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
## 2013-03-06 NOTE — Progress Notes (Signed)
UR completed. Planned for SNF at d/c.   Carlyle LipaMichelle Mutasim Tuckey, RN BSN MHA CCM Trauma/Neuro ICU Case Manager [phone]

## 2013-03-06 NOTE — Progress Notes (Signed)
Still short of breath.  Improving.  Looking for placement.  This patient has been seen and I agree with the findings and treatment plan.  Marta LamasJames O. Gae BonWyatt, III, MD, FACS [phone] (pager) [phone] (direct pager) Trauma Surgeon
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
## 2013-03-07 DIAGNOSIS — E871 Hypo-osmolality and hyponatremia: Secondary | ICD-10-CM

## 2013-03-07 LAB — BASIC METABOLIC PANEL
BUN: 6 mg/dL (ref 6–23)
CO2: 35 mEq/L — ABNORMAL HIGH (ref 19–32)
Calcium: 8.8 mg/dL (ref 8.4–10.5)
Chloride: 80 mEq/L — ABNORMAL LOW (ref 96–112)
Creatinine, Ser: 0.54 mg/dL (ref 0.50–1.10)
GFR calc Af Amer: >90 mL/min (ref >90)
GFR, EST NON AFRICAN AMERICAN: 86 mL/min — AB (ref >90)
Glucose, Bld: 104 mg/dL — ABNORMAL HIGH (ref 70–99)
POTASSIUM: 3.4 meq/L — AB (ref 3.7–5.3)
SODIUM: 125 meq/L — AB (ref 137–147)

## 2013-03-07 LAB — CBC
HCT: 28.5 % — ABNORMAL LOW (ref 36.0–46.0)
Hemoglobin: 9.8 g/dL — ABNORMAL LOW (ref 12.0–15.0)
MCH: 30.8 pg (ref 26.0–34.0)
MCHC: 34.4 g/dL (ref 30.0–36.0)
MCV: 89.6 fL (ref 78.0–100.0)
Platelets: 257 10*3/uL (ref 150–400)
RBC: 3.18 MIL/uL — AB (ref 3.87–5.11)
RDW: 12.9 % (ref 11.5–15.5)
WBC: 4.7 10*3/uL (ref 4.0–10.5)

## 2013-03-07 LAB — URINALYSIS, ROUTINE W REFLEX MICROSCOPIC
BILIRUBIN URINE: NEGATIVE
Glucose, UA: NEGATIVE mg/dL
HGB URINE DIPSTICK: NEGATIVE
Ketones, ur: NEGATIVE mg/dL
Nitrite: NEGATIVE
Protein, ur: NEGATIVE mg/dL
Specific Gravity, Urine: 1.006 (ref 1.005–1.030)
Urobilinogen, UA: 0.2 mg/dL (ref 0.0–1.0)
pH: 8 (ref 5.0–8.0)

## 2013-03-07 LAB — URINE MICROSCOPIC-ADD ON

## 2013-03-07 MED ORDER — IPRATROPIUM-ALBUTEROL 0.5-2.5 (3) MG/3ML IN SOLN
3.0000 mL | Freq: Three times a day (TID) | RESPIRATORY_TRACT | Status: DC
  Administered 2013-03-07 – 2013-03-08 (×3): 3 mL via RESPIRATORY_TRACT
  Filled 2013-03-07 (×4): qty 3

## 2013-03-07 MED ORDER — BISACODYL 5 MG PO TBEC
10.0000 mg | DELAYED_RELEASE_TABLET | Freq: Every day | ORAL | Status: DC
  Administered 2013-03-07 – 2013-03-08 (×2): 10 mg via ORAL
  Filled 2013-03-07 (×2): qty 2

## 2013-03-07 MED ORDER — ACETAMINOPHEN 325 MG PO TABS
650.0000 mg | ORAL_TABLET | ORAL | Status: DC | PRN
  Administered 2013-03-07 – 2013-03-08 (×4): 650 mg via ORAL
  Filled 2013-03-07 (×4): qty 2

## 2013-03-07 MED ORDER — ALPRAZOLAM 0.25 MG PO TABS
0.2500 mg | ORAL_TABLET | Freq: Three times a day (TID) | ORAL | Status: DC | PRN
  Administered 2013-03-07 – 2013-03-08 (×3): 0.25 mg via ORAL
  Filled 2013-03-07 (×3): qty 1

## 2013-03-07 MED ORDER — DOCUSATE SODIUM 100 MG PO CAPS
200.0000 mg | ORAL_CAPSULE | Freq: Two times a day (BID) | ORAL | Status: DC
  Administered 2013-03-07 – 2013-03-08 (×3): 200 mg via ORAL
  Filled 2013-03-07 (×3): qty 2

## 2013-03-07 MED ORDER — POTASSIUM CHLORIDE CRYS ER 20 MEQ PO TBCR
40.0000 meq | EXTENDED_RELEASE_TABLET | Freq: Every day | ORAL | Status: DC
  Administered 2013-03-07 – 2013-03-08 (×2): 40 meq via ORAL
  Filled 2013-03-07 (×2): qty 2

## 2013-03-07 MED ORDER — ALBUTEROL SULFATE (2.5 MG/3ML) 0.083% IN NEBU
2.5000 mg | INHALATION_SOLUTION | RESPIRATORY_TRACT | Status: DC | PRN
  Administered 2013-03-08 (×2): 2.5 mg via RESPIRATORY_TRACT
  Filled 2013-03-07 (×2): qty 3

## 2013-03-07 MED ORDER — ALPRAZOLAM 0.25 MG PO TABS
0.2500 mg | ORAL_TABLET | Freq: Three times a day (TID) | ORAL | Status: DC | PRN
  Administered 2013-03-07 – 2013-03-08 (×3): 0.25 mg via ORAL
  Filled 2013-03-07 (×3): qty 1

## 2013-03-07 NOTE — Progress Notes (Signed)
Patient doing much better.  Not sure why she is hyponatremic.  Moderate hyponatremia usually from too much water and restriction should help.  Will check again tomorrow and then DC if okay.  Will send a message to Dr. Fannie Kneelint Young about patient.  This patient has been seen and I agree with the findings and treatment plan.  Marta LamasJames O. Gae BonWyatt, III, MD, FACS [phone] (pager) [phone] (direct pager) Trauma Surgeon

## 2013-03-07 NOTE — Progress Notes (Signed)
Pt ambulated with minimal assist in room several times.  Offered to take her walking in the hall, pt refused several times.
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
## 2013-03-07 NOTE — Progress Notes (Signed)
OT Cancellation Note  Patient Details Name: Kari [name] XXX Aman MRN: [id number] DOB: Jun 28, 1928   Cancelled Treatment:    Eval Not Completed after 3 attempts (11:32, 1:35 and 3:00) due to patient's level of consciousness (recent medication).    Erubiel Manasco 03/07/2013, 3:03 PM

## 2013-03-07 NOTE — Clinical Social Work Note (Signed)
Clinical Social Worker continuing to follow patient and family for support and discharge planning needs.  Patient and family have chosen [facility] and bed offer was extended by facility.  Per MD, patient is not medically ready for discharge.  CSW has [facility] clinical [facility] and await insurance authorization.  Facility plans to also [facility].    Patient daughter would like to provide patient with transportation at discharge.  CSW notified CM regarding portable oxygen tank needs for transport at discharge - CM to address.  CSW remains available for support and to facilitate patient discharge needs once medically stable.  Macario GoldsJesse Virgilio [place], [place] [phone]

## 2013-03-07 NOTE — Progress Notes (Signed)
Attempting to arrange O2 for patient for her transport. [employer] will not approve Home O2 when they have already approved SNF as those are conflicting.  The [facility] is unable to loan a tank out because they rent them from a company and wouldn't have a way to get the tank back from the other facility.    Daughter that is planning to transport the patient was not present in the room when I went to speak to her at 1530.  Other family said that she would be returning within an hour (by now) and would have the RN call me when she arrived. Only option may be to have the daughter pick up a portable tank from the receiving facility and use it to transport the patient and turn it back in when [facility].  Will investigate this further tomorrow.
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
## 2013-03-08 LAB — BASIC METABOLIC PANEL
BUN: 8 mg/dL (ref 6–23)
CHLORIDE: 83 meq/L — AB (ref 96–112)
CO2: 36 mEq/L — ABNORMAL HIGH (ref 19–32)
Calcium: 8.6 mg/dL (ref 8.4–10.5)
Creatinine, Ser: 0.6 mg/dL (ref 0.50–1.10)
GFR calc non Af Amer: 83 mL/min — ABNORMAL LOW (ref >90)
Glucose, Bld: 101 mg/dL — ABNORMAL HIGH (ref 70–99)
POTASSIUM: 3.6 meq/L — AB (ref 3.7–5.3)
Sodium: 127 mEq/L — ABNORMAL LOW (ref 137–147)

## 2013-03-08 MED ORDER — ALPRAZOLAM 0.25 MG PO TABS: 0.2500 mg | ORAL_TABLET | Freq: Three times a day (TID) | ORAL | Status: DC | PRN

## 2013-03-08 MED ORDER — TRAMADOL HCL 50 MG PO TABS: 50.0000 mg | ORAL_TABLET | ORAL | Status: DC | PRN

## 2013-03-08 MED ORDER — TRAMADOL HCL 50 MG PO TABS
50.0000 mg | ORAL_TABLET | ORAL | Status: DC | PRN
  Administered 2013-03-08 (×3): 50 mg via ORAL
  Filled 2013-03-08 (×3): qty 1

## 2013-03-08 MED ORDER — POTASSIUM CHLORIDE CRYS ER 20 MEQ PO TBCR: 40.0000 meq | EXTENDED_RELEASE_TABLET | Freq: Every day | ORAL | Status: DC

## 2013-03-08 MED ORDER — PAROXETINE HCL 10 MG PO TABS: 10.0000 mg | ORAL_TABLET | Freq: Every day | ORAL | Status: DC

## 2013-03-08 NOTE — Discharge Planning (Signed)
[facility] [place] [facility].
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
## 2013-03-08 NOTE — Clinical Social Work Note (Signed)
Clinical Social Worker attempted to facilitate patient discharge including contacting patient family and facility to confirm patient discharge plans however nursing unit and had already discharged patient with family.  Clinical information faxed to facility.  CM and RN arranged transportation with patient family.  RN to call report following discharge per CSW request.  Clinical Social Worker will sign off for now as social work intervention is no longer needed. Please consult us again if new need arises.  Macario GoldsJesse Lorenza Shakir, [place] [phone]
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
## 2013-03-08 NOTE — Progress Notes (Signed)
PT Cancellation Note  Patient Details Name: Kari [name] MRN: [id number] DOB: February 23, 1928   Cancelled Treatment:    Reason Eval/Treat Not Completed: Patient declined, no reason specified.  Pt is saving her energy for her car ride to SNF.     Kari [name], PT, DPT [phone]   03/08/2013, 11:25 AM

## 2013-03-08 NOTE — Progress Notes (Signed)
Agree Ok for DC to SNF

## 2013-03-14 ENCOUNTER — Telehealth: Payer: Self-pay | Admitting: Internal Medicine

## 2013-03-14 NOTE — Telephone Encounter (Signed)
Wrong pt
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
## 2013-04-09 ENCOUNTER — Telehealth: Payer: Self-pay | Admitting: Internal Medicine

## 2013-04-09 MED ORDER — MOMETASONE FURO-FORMOTEROL FUM 200-5 MCG/ACT IN AERO: 2.0000 | INHALATION_SPRAY | Freq: Two times a day (BID) | RESPIRATORY_TRACT | Status: DC

## 2013-04-09 NOTE — Telephone Encounter (Signed)
ATC line busy x 4 wcb 

## 2013-04-09 NOTE — Telephone Encounter (Signed)
Called spoke w/ pt. She reports when she was in the hospital at the end of February she was placed on high dose dulera. She reports it does help with her breathing. Pt is wanting RX for this. According to epic she was changed from Lebanondulera to breo back in November. Per pt she was put back on dulera. Please advise Dr. Maple HudsonYoung thanks --[facility] Allergies  Allergen Reactions  . Ivp Dye [Iodinated Diagnostic Agents]     Pt states she thinks she had BP drop-unsure---Pre Treatment pt states she does fine  . Aspirin   . Codeine Other (See Comments)  . Other     Narcotics   . Penicillins   . Symbicort [Budesonide-Formoterol Fumarate]     Leg cramps

## 2013-04-09 NOTE — Telephone Encounter (Signed)
Ok to d/c  [employer] Rx Dulera 200, # 1, 2 puffs then rinse mouth, twice daily- maintenance   Refill prn

## 2013-04-10 NOTE — Telephone Encounter (Signed)
I called and spoke with pt. Aware RX has been sent. Nothing further needed 

## 2013-04-12 ENCOUNTER — Telehealth: Payer: Self-pay | Admitting: Internal Medicine
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
## 2013-04-18 ENCOUNTER — Ambulatory Visit (INDEPENDENT_AMBULATORY_CARE_PROVIDER_SITE_OTHER): Payer: Medicare PPO | Admitting: Gastroenterology

## 2013-04-18 ENCOUNTER — Encounter: Payer: Self-pay | Admitting: Gastroenterology

## 2013-04-18 ENCOUNTER — Telehealth: Payer: Self-pay | Admitting: *Deleted

## 2013-04-18 VITALS — BP 124/74 | HR 70 | Ht 63.0 in | Wt 176.6 lb

## 2013-04-18 DIAGNOSIS — R1013 Epigastric pain: Secondary | ICD-10-CM

## 2013-04-18 DIAGNOSIS — K219 Gastro-esophageal reflux disease without esophagitis: Secondary | ICD-10-CM

## 2013-04-18 NOTE — Patient Instructions (Signed)
We [facility] a [facility] to [facility] [place] [place] for Potassium Chloride.   We made you a follow up with Dr. Rhea BeltonPyrtle on 06-05-2013 at 10 AM.   Please call us if you are having problems and need seen sooner.

## 2013-04-18 NOTE — Telephone Encounter (Signed)
I called [facility] in [place] , [place] and spoke to the pharmacist, Rich.  I asked about this patient's potassium Chloride prescription and she has 3 refills. She take is once daily. I asked if it came in the punch card sleeves instead of being dispensed in a bottle and he said it did not with the manufacture that they use.  I told him I would let her know.
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
## 2013-05-03 ENCOUNTER — Ambulatory Visit: Payer: Medicare PPO | Admitting: Internal Medicine

## 2013-05-10 ENCOUNTER — Ambulatory Visit: Payer: Medicare PPO | Admitting: Internal Medicine

## 2013-05-30 ENCOUNTER — Encounter: Payer: Self-pay | Admitting: Internal Medicine

## 2013-06-05 ENCOUNTER — Ambulatory Visit: Payer: Medicare PPO | Admitting: Internal Medicine

## 2013-07-15 ENCOUNTER — Telehealth: Payer: Self-pay | Admitting: Internal Medicine

## 2013-07-15 MED ORDER — PREDNISONE 10 MG PO TABS: ORAL_TABLET | ORAL | Status: DC

## 2013-07-15 NOTE — Telephone Encounter (Signed)
lmomtcb x1 for pt 

## 2013-07-15 NOTE — Telephone Encounter (Signed)
Per CY, send in a prednisone 10mg  taper, 4 tabs po X2 days, then 3 tabs po X2 days, then 2 tabs po X2 days, then 1 tab po X2 days, then stop.  Continue taking all other medications as prescribed.  This prednisone taper has already been sent [facility] to [facility] [place].  LMTCB X1 to make pt aware of these results.

## 2013-07-15 NOTE — Telephone Encounter (Signed)
Spoke with the pt and notified of recs per CDY  She verbalized understanding  Nothing further needed 

## 2013-07-15 NOTE — Telephone Encounter (Signed)
Called and spoke with pt and she stated that her breathing is very bad.  She stated that her number is [phone] and this is the number she should be called back on.  Pt stated that she is having to use the nebulizer machine every 4 hours.  She feels that the spiriva is not strong enough for her and is not helping her at all.  She stated that she is now getting up a small amount of yellow sputum at times.  Pt is requesting recs from CY.  Last ov---07/10/12 No pending appts.  Allergies  Allergen Reactions  . Ivp Dye [Iodinated Diagnostic Agents]     Pt states she thinks she had BP drop-unsure---Pre Treatment pt states she does fine  . Aspirin   . Codeine Other (See Comments)  . Other     Narcotics   . Penicillins   . Symbicort [Budesonide-Formoterol Fumarate]     Leg cramps    Current Outpatient Prescriptions on File Prior to Visit  Medication Sig Dispense Refill  . Albuterol Sulfate (PROVENTIL HFA IN) Inhale 180 mcg into the lungs as directed.      . Calcium Carbonate-Vitamin D (CALTRATE 600+D) 600-400 MG-UNIT per tablet Take 1 tablet by mouth daily.      . Fluticasone Furoate-Vilanterol (BREO ELLIPTA) 100-25 MCG/INH AEPB Inhale 1 puff into the lungs daily.  14 each  0  . hydrochlorothiazide (MICROZIDE) 12.5 MG capsule Take 1 capsule by mouth daily.      Marland Kitchen. levothyroxine (SYNTHROID, LEVOTHROID) 112 MCG tablet Take 112 mcg by mouth daily.      . mometasone-formoterol (DULERA) 200-5 MCG/ACT AERO Inhale 2 puffs into the lungs 2 (two) times daily.  1 Inhaler  0  . multivitamin (THERAGRAN) per tablet Take 1 tablet by mouth daily.      . ranitidine (ZANTAC) 150 MG tablet Take 1 tablet (150 mg total) by mouth 2 (two) times daily.  60 tablet  5  . Spacer/Aero-Holding Chambers (AEROCHAMBER MINI CHAMBER) DEVI 1 Container by Does not apply route daily.  1 Device  0   No current facility-administered medications on file prior to visit.

## 2013-08-08 ENCOUNTER — Telehealth: Payer: Self-pay | Admitting: Internal Medicine

## 2013-08-08 MED ORDER — PREDNISONE 10 MG PO TABS: ORAL_TABLET | ORAL | Status: DC
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
## 2013-08-08 NOTE — Telephone Encounter (Signed)
Pt aware of recs. RX called in.  

## 2013-08-08 NOTE — Telephone Encounter (Signed)
Best approach sounds like Rx prednisone 10 mg, # 20, 4 X 2 DAYS, 3 X 2 DAYS, 2 X 2 DAYS, 1 X 2 DAYS

## 2013-08-21 ENCOUNTER — Ambulatory Visit: Payer: Medicare PPO | Admitting: Internal Medicine

## 2013-10-24 ENCOUNTER — Encounter: Payer: Self-pay | Admitting: Gastroenterology

## 2013-10-28 ENCOUNTER — Encounter: Payer: Self-pay | Admitting: Internal Medicine

## 2013-10-28 ENCOUNTER — Ambulatory Visit (INDEPENDENT_AMBULATORY_CARE_PROVIDER_SITE_OTHER): Payer: Medicare PPO | Admitting: Internal Medicine

## 2013-10-28 ENCOUNTER — Other Ambulatory Visit (INDEPENDENT_AMBULATORY_CARE_PROVIDER_SITE_OTHER): Payer: Medicare PPO

## 2013-10-28 VITALS — BP 140/82 | HR 68 | Ht 63.0 in | Wt 173.0 lb

## 2013-10-28 DIAGNOSIS — R1084 Generalized abdominal pain: Secondary | ICD-10-CM

## 2013-10-28 DIAGNOSIS — Z8619 Personal history of other infectious and parasitic diseases: Secondary | ICD-10-CM

## 2013-10-28 DIAGNOSIS — R1013 Epigastric pain: Secondary | ICD-10-CM

## 2013-10-28 DIAGNOSIS — K449 Diaphragmatic hernia without obstruction or gangrene: Secondary | ICD-10-CM

## 2013-10-28 LAB — COMPREHENSIVE METABOLIC PANEL
ALT: 16 U/L (ref 0–35)
AST: 16 U/L (ref 0–37)
Albumin: 4.4 g/dL (ref 3.5–5.2)
Alkaline Phosphatase: 62 U/L (ref 39–117)
BILIRUBIN TOTAL: 0.5 mg/dL (ref 0.2–1.2)
BUN: 16 mg/dL (ref 6–23)
CO2: 28 meq/L (ref 19–32)
CREATININE: 0.7 mg/dL (ref 0.4–1.2)
Calcium: 9.1 mg/dL (ref 8.4–10.5)
Chloride: 90 mEq/L — ABNORMAL LOW (ref 96–112)
GFR: 86.23 mL/min (ref >60.00)
GLUCOSE: 94 mg/dL (ref 70–99)
Potassium: 4.4 mEq/L (ref 3.5–5.1)
Sodium: 125 mEq/L — ABNORMAL LOW (ref 135–145)
TOTAL PROTEIN: 7.5 g/dL (ref 6.0–8.3)

## 2013-10-28 LAB — CBC WITH DIFFERENTIAL/PLATELET
BASOS PCT: 0.7 % (ref 0.0–3.0)
Basophils Absolute: 0 10*3/uL (ref 0.0–0.1)
EOS PCT: 3.6 % (ref 0.0–5.0)
Eosinophils Absolute: 0.1 10*3/uL (ref 0.0–0.7)
HEMATOCRIT: 36.4 % (ref 36.0–46.0)
HEMOGLOBIN: 12.4 g/dL (ref 12.0–15.0)
LYMPHS ABS: 1 10*3/uL (ref 0.7–4.0)
Lymphocytes Relative: 27.6 % (ref 12.0–46.0)
MCHC: 34 g/dL (ref 30.0–36.0)
MCV: 91.9 fl (ref 78.0–100.0)
MONOS PCT: 17 % — AB (ref 3.0–12.0)
Monocytes Absolute: 0.6 10*3/uL (ref 0.1–1.0)
NEUTROS ABS: 1.9 10*3/uL (ref 1.4–7.7)
Neutrophils Relative %: 51.1 % (ref 43.0–77.0)
Platelets: 256 10*3/uL (ref 150.0–400.0)
RBC: 3.96 Mil/uL (ref 3.87–5.11)
RDW: 13.8 % (ref 11.5–15.5)
WBC: 3.8 10*3/uL — AB (ref 4.0–10.5)
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
## 2013-10-28 NOTE — Patient Instructions (Addendum)
You have been scheduled for an Upper GI Series at [facility]. Your appointment is on 10/31/2013  At 10:00am. Please arrive 15 minutes prior to your test for registration. Make sure not to eat or drink anything after midnight on the night before your test. If you need to reschedule, please call radiology at [phone]. ________________________________________________________________ An upper GI series uses x rays to help diagnose problems of the upper GI tract, which includes the esophagus, stomach, and duodenum. The duodenum is the first part of the small intestine. An upper GI series is conducted by a radiology technologist or a radiologist-a doctor who specializes in x-ray imaging-at a hospital or outpatient center. While sitting or standing in front of an x-ray machine, the patient drinks barium liquid, which is often white and has a chalky consistency and taste. The barium liquid coats the lining of the upper GI tract and makes signs of disease show up more clearly on x rays. X-ray video, called fluoroscopy, is used to view the barium liquid moving through the esophagus, stomach, and duodenum. Additional x rays and fluoroscopy are performed while the patient lies on an x-ray table. To fully coat the upper GI tract with barium liquid, the technologist or radiologist may press on the abdomen or ask the patient to change position. Patients hold still in various positions, allowing the technologist or radiologist to take x rays of the upper GI tract at different angles. If a technologist conducts the upper GI series, a radiologist will later examine the images to look for problems.  This test typically takes about 1 hour to complete. ___________________________________________  Go to the basement for labs today  Use the Pepcid 20mg  twice a day- This is not covered by your insurance you can purchase OTC at [facility]

## 2013-10-31 ENCOUNTER — Other Ambulatory Visit: Payer: Medicare PPO

## 2013-10-31 ENCOUNTER — Ambulatory Visit (HOSPITAL_COMMUNITY)
Admission: RE | Admit: 2013-10-31 | Discharge: 2013-10-31 | Disposition: A | Discharge status: Home / Self Care | Payer: Medicare PPO | Source: Ambulatory Visit | Attending: Internal Medicine | Admitting: Internal Medicine

## 2013-10-31 DIAGNOSIS — R1084 Generalized abdominal pain: Secondary | ICD-10-CM | POA: Diagnosis not present

## 2013-10-31 DIAGNOSIS — Z8619 Personal history of other infectious and parasitic diseases: Secondary | ICD-10-CM

## 2013-10-31 DIAGNOSIS — R140 Abdominal distension (gaseous): Secondary | ICD-10-CM | POA: Diagnosis not present

## 2013-10-31 DIAGNOSIS — R1013 Epigastric pain: Secondary | ICD-10-CM

## 2013-11-02 LAB — HELICOBACTER PYLORI  SPECIAL ANTIGEN: H. PYLORI ANTIGEN STOOL: NEGATIVE

## 2014-03-19 ENCOUNTER — Telehealth: Payer: Self-pay | Admitting: Internal Medicine

## 2014-03-19 NOTE — Telephone Encounter (Signed)
Left message for pt to call back  °

## 2014-03-27 NOTE — Telephone Encounter (Signed)
Left message for pt to call back  °

## 2014-03-27 NOTE — Telephone Encounter (Signed)
Offered pt an appt with APP but she states she will wait to see Dr. Rhea BeltonPyrtle. Pt scheduled for OV with Dr. Rhea BeltonPyrtle in May. Pt aware of appt.

## 2014-04-24 ENCOUNTER — Telehealth: Payer: Self-pay

## 2014-04-24 NOTE — Telephone Encounter (Signed)
04/24/14 Disc Re[facility] from [facility] and filed on shelf.Jeneen Rinks/[facility]

## 2014-05-02 ENCOUNTER — Encounter: Payer: Self-pay | Admitting: *Deleted

## 2014-05-12 ENCOUNTER — Ambulatory Visit: Payer: Self-pay | Admitting: Internal Medicine

## 2014-05-29 ENCOUNTER — Ambulatory Visit (INDEPENDENT_AMBULATORY_CARE_PROVIDER_SITE_OTHER): Payer: Medicare PPO | Admitting: Internal Medicine

## 2014-05-29 ENCOUNTER — Encounter: Payer: Self-pay | Admitting: Internal Medicine

## 2014-05-29 VITALS — BP 136/62 | HR 68 | Ht 63.0 in | Wt 179.1 lb

## 2014-05-29 DIAGNOSIS — R1013 Epigastric pain: Secondary | ICD-10-CM | POA: Diagnosis not present

## 2014-05-29 NOTE — Patient Instructions (Signed)
Please continue your Carafate.   Please follow-up with Dr. Rhea BeltonPyrtle in 4 months.
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
## 2014-07-23 ENCOUNTER — Encounter (HOSPITAL_COMMUNITY)
Admission: AD | Disposition: A | Discharge status: Home / Self Care | Payer: Self-pay | Source: Ambulatory Visit | Attending: Internal Medicine

## 2014-07-23 ENCOUNTER — Ambulatory Visit (HOSPITAL_COMMUNITY)
Admission: AD | Admit: 2014-07-23 | Discharge: 2014-07-23 | Disposition: A | Discharge status: Home / Self Care | Payer: Medicare PPO | Source: Ambulatory Visit | Attending: Internal Medicine | Admitting: Internal Medicine

## 2014-07-23 ENCOUNTER — Telehealth: Payer: Self-pay | Admitting: Internal Medicine

## 2014-07-23 ENCOUNTER — Ambulatory Visit (INDEPENDENT_AMBULATORY_CARE_PROVIDER_SITE_OTHER): Payer: Medicare PPO | Admitting: Internal Medicine

## 2014-07-23 ENCOUNTER — Encounter: Payer: Self-pay | Admitting: Internal Medicine

## 2014-07-23 VITALS — BP 142/88 | HR 61 | Ht 64.0 in | Wt 174.4 lb

## 2014-07-23 DIAGNOSIS — J45909 Unspecified asthma, uncomplicated: Secondary | ICD-10-CM | POA: Diagnosis not present

## 2014-07-23 DIAGNOSIS — J449 Chronic obstructive pulmonary disease, unspecified: Secondary | ICD-10-CM | POA: Insufficient documentation

## 2014-07-23 DIAGNOSIS — Z87891 Personal history of nicotine dependence: Secondary | ICD-10-CM | POA: Insufficient documentation

## 2014-07-23 DIAGNOSIS — K449 Diaphragmatic hernia without obstruction or gangrene: Secondary | ICD-10-CM | POA: Diagnosis not present

## 2014-07-23 DIAGNOSIS — I4891 Unspecified atrial fibrillation: Secondary | ICD-10-CM | POA: Diagnosis not present

## 2014-07-23 DIAGNOSIS — I480 Paroxysmal atrial fibrillation: Secondary | ICD-10-CM

## 2014-07-23 DIAGNOSIS — E079 Disorder of thyroid, unspecified: Secondary | ICD-10-CM | POA: Diagnosis not present

## 2014-07-23 DIAGNOSIS — I272 Other secondary pulmonary hypertension: Secondary | ICD-10-CM | POA: Diagnosis not present

## 2014-07-23 DIAGNOSIS — K219 Gastro-esophageal reflux disease without esophagitis: Secondary | ICD-10-CM | POA: Diagnosis not present

## 2014-07-23 DIAGNOSIS — I252 Old myocardial infarction: Secondary | ICD-10-CM | POA: Insufficient documentation

## 2014-07-23 DIAGNOSIS — D860 Sarcoidosis of lung: Secondary | ICD-10-CM | POA: Insufficient documentation

## 2014-07-23 DIAGNOSIS — R002 Palpitations: Secondary | ICD-10-CM | POA: Insufficient documentation

## 2014-07-23 HISTORY — PX: EP IMPLANTABLE DEVICE: SHX172B

## 2014-07-23 SURGERY — LOOP RECORDER INSERTION
Anesthesia: LOCAL

## 2014-07-23 MED ORDER — LIDOCAINE-EPINEPHRINE 1 %-1:100000 IJ SOLN
INTRAMUSCULAR | Status: DC | PRN
Start: 2014-07-23 — End: 2014-07-23
  Administered 2014-07-23: 10 mL

## 2014-07-23 SURGICAL SUPPLY — 2 items
LOOP REVEAL LINQSYS (Prosthesis & Implant Heart) ×1 IMPLANT
PACK LOOP INSERTION (CUSTOM PROCEDURE TRAY) ×2 IMPLANT

## 2014-07-23 NOTE — Interval H&P Note (Signed)
History and Physical Interval Note:  07/23/2014 4:07 PM  Kari [name]  has presented today for surgery, with the diagnosis of syncope  The various methods of treatment have been discussed with the patient and family. After consideration of risks, benefits and other options for treatment, the patient has consented to  Procedure(s): Loop Recorder Insertion (N/A) as a surgical intervention .  The patient's history has been reviewed, patient examined, no change in status, stable for surgery.  I have reviewed the patient's chart and labs.  Questions were answered to the patient's satisfaction.     Hillis RangeJames Junius Faucett

## 2014-07-23 NOTE — Patient Instructions (Signed)
Medication Instructions:  Your physician recommends that you continue on your current medications as directed. Please refer to the Current Medication list given to you today.   Labwork: None orderd  Testing/Procedures: LINQ Implant today---after eating lunch go to the [facility]   Follow-Up:  Your physician recommends that you schedule a follow-up appointment with Dr Maple HudsonYoung per Dr Johney FrameAllred for pulmonary hypertension  Your physician recommends that you schedule a follow-up appointment in: 7-14 days from today in device ckinic for wound check  Your physician recommends that you schedule a follow-up appointment in: 3 months with Dr Johney FrameAllred     Any Other Special Instructions Will Be Listed Below (If Applicable).

## 2014-07-23 NOTE — Telephone Encounter (Signed)
Spoke with Jasmine DecemberSharon at [facility], I advised that CY is booked out until September but she can see a provider as early as next Tuesday (MW).   Jasmine DecemberSharon will speak with Dr. Johney FrameAllred and call us back if this is ok.  Will await call.

## 2014-07-23 NOTE — Discharge Instructions (Signed)
Follow supplemental sheet of paper with loop recorder insertion discharge instructions.

## 2014-07-23 NOTE — Progress Notes (Signed)
Electrophysiology Office Note   Date:  07/23/2014   ID:  Kari [name], DOB Jul 03, 1928, MRN [id number]  PCP:  PROVIDER NOT IN SYSTEM   Primary Electrophysiologist: Hillis RangeJames Azizi Bally, MD    Chief Complaint  Patient presents with  . Palpitations     History of Present Illness: Kari [name] is a 81 y.o. female who presents today for electrophysiology evaluation.   I have not seen her since 2013.  She presents today for follow-up.  She reports SOB and has not seen Dr Maple HudsonYoung in quite some time.  She has palpitations which she finds concerning.  She thinks that she is in afib today but she is not.   She remains active for her age.  She recently presented to the hospital in her home town and had an echo which revealed preserved EF with pulmonary hypertension.  Today, she denies symptoms of  chest pain,  orthopnea, PND, lower extremity edema, claudication, dizziness, presyncope, syncope, bleeding, or neurologic sequela. The patient is tolerating medications without difficulties and is otherwise without complaint today.    Past Medical History  Diagnosis Date  . COPD (chronic obstructive pulmonary disease)   . Thyroid disease   . Osteoporosis   . Sarcoid     pulmonary and dermatologic  . Angina at rest   . MI, old   . Palpitations   . Abdominal pain   . Sarcoidosis, lung   . Atrial fibrillation   . Arthritis   . Asthma   . History of gallstones   . History of scarlet fever   . Premature atrial contractions   . Helicobacter pylori gastritis 11/2011  . GERD (gastroesophageal reflux disease)   . Hiatal hernia   . Zenker's diverticulum    Past Surgical History  Procedure Laterality Date  . Thyroidectomy    . Cataract extraction Bilateral   . Colonoscopy    . Cardiolite  2000  . Cholecystectomy  04/1996  . Abdominal hysterectomy  10/1997  . Hernia repair      inguinal and hiatal/abdominal  . Leg surgery      right leg...rod/screws     No current outpatient prescriptions on  file.   No current facility-administered medications for this visit.    Allergies:   Advair diskus; Aspirin; Dulera; Ivp dye; Amlodipine besylate; Codeine; Morphine and related; Other; Penicillins; Symbicort; and Tramadol   Social History:  The patient  reports that she quit smoking about 46 years ago. Her smoking use included Cigarettes and Pipe. She has a 20 pack-year smoking history. She has never used smokeless tobacco. She reports that she drinks alcohol. She reports that she does not use illicit drugs.   Family History:  The patient's  family history includes Diabetes in her brother, daughter, and father; Heart disease in her mother; Other in her sister; Rheumatic fever in her sister. There is no history of Colon cancer.    ROS:  Please see the history of present illness.   All other systems are reviewed and negative.    PHYSICAL EXAM: VS:  BP 142/88 mmHg  Pulse 61  Ht 5\' 4"  (1.626 m)  Wt 79.107 kg (174 lb 6.4 oz)  BMI 29.92 kg/m2 , BMI Body mass index is 29.92 kg/(m^2). GEN: Well nourished, well developed, in no acute distress HEENT: normal Neck: no JVD, carotid bruits, or masses Cardiac: RRR; no murmurs, rubs, or gallops,no edema  Respiratory:  clear to auscultation bilaterally, normal work of breathing GI: soft, nontender, nondistended, + BS MS:  no deformity or atrophy Skin: warm and dry  Neuro:  Strength and sensation are intact Psych: euthymic mood, full affect  EKG:  EKG is ordered today. The ekg ordered today shows sinus bradycardia 57 bpm, PR 262, otherwise normal ekg   Recent Labs: 10/28/2013: ALT 16; BUN 16; Creatinine, Ser 0.7; Hemoglobin 12.4; Platelets 256.0; Potassium 4.4; Sodium 125*    Lipid Panel     Component Value Date/Time   TRIG 62 02/28/2013 0132     Wt Readings from Last 3 Encounters:  07/23/14 79.107 kg (174 lb 6.4 oz)  05/29/14 81.251 kg (179 lb 2 oz)  10/28/13 78.472 kg (173 lb)      Other studies Reviewed: Additional studies/  records that were reviewed today include: echo [facility]  [facility] 03/19/14 Review of the above records today demonstrates: EF 60-65%, normal RV function, moderate pulmonary htn (RV pressure 50-55 mm Hg), mild to moderate la enlargement   ASSESSMENT AND PLAN:  1.  Palpitations Unclear etiology Though she carries a diganosis of afib, this has not been well documented.  She complains of palpitations and thinks that she is in afib today.  She is in sinus.  I think that in order to adequately manage her symptoms, better characterization of her arrhythmia is required.  Event monitor has previously shown only pacs.  I would therefore advise more definitive evaluation with implantable loop recorder.  Risks, benefits, and alternatives of implantable loop recorder placement were discussed with patient who wishes to proceed at this time.  2. afib As above  3. Pulmonary hypertension Likely due to chronic lung disease Will refer back to Dr Maple Hudson for further management.   Current medicines are reviewed at length with the patient today.   The patient does not have concerns regarding her medicines.  The following changes were made today:  none  Labs/ tests ordered today include:  Orders Placed This Encounter  Procedures  . Obtain Consent  . EKG 12-Lead     Signed, [name], MD  07/23/2014 2:51 PM     [facility] [address] [place] [address] [phone] (office) [phone] (fax)

## 2014-07-23 NOTE — H&P (View-Only) (Signed)
Electrophysiology Office Note   Date:  07/23/2014   ID:  Kari [name], DOB Jul 03, 1928, MRN [id number]  PCP:  PROVIDER NOT IN SYSTEM   Primary Electrophysiologist: Hillis RangeJames Oryon Gary, MD    Chief Complaint  Patient presents with  . Palpitations     History of Present Illness: Kari [name] is a 81 y.o. female who presents today for electrophysiology evaluation.   I have not seen her since 2013.  She presents today for follow-up.  She reports SOB and has not seen Dr Maple HudsonYoung in quite some time.  She has palpitations which she finds concerning.  She thinks that she is in afib today but she is not.   She remains active for her age.  She recently presented to the hospital in her home town and had an echo which revealed preserved EF with pulmonary hypertension.  Today, she denies symptoms of  chest pain,  orthopnea, PND, lower extremity edema, claudication, dizziness, presyncope, syncope, bleeding, or neurologic sequela. The patient is tolerating medications without difficulties and is otherwise without complaint today.    Past Medical History  Diagnosis Date  . COPD (chronic obstructive pulmonary disease)   . Thyroid disease   . Osteoporosis   . Sarcoid     pulmonary and dermatologic  . Angina at rest   . MI, old   . Palpitations   . Abdominal pain   . Sarcoidosis, lung   . Atrial fibrillation   . Arthritis   . Asthma   . History of gallstones   . History of scarlet fever   . Premature atrial contractions   . Helicobacter pylori gastritis 11/2011  . GERD (gastroesophageal reflux disease)   . Hiatal hernia   . Zenker's diverticulum    Past Surgical History  Procedure Laterality Date  . Thyroidectomy    . Cataract extraction Bilateral   . Colonoscopy    . Cardiolite  2000  . Cholecystectomy  04/1996  . Abdominal hysterectomy  10/1997  . Hernia repair      inguinal and hiatal/abdominal  . Leg surgery      right leg...rod/screws     No current outpatient prescriptions on  file.   No current facility-administered medications for this visit.    Allergies:   Advair diskus; Aspirin; Dulera; Ivp dye; Amlodipine besylate; Codeine; Morphine and related; Other; Penicillins; Symbicort; and Tramadol   Social History:  The patient  reports that she quit smoking about 46 years ago. Her smoking use included Cigarettes and Pipe. She has a 20 pack-year smoking history. She has never used smokeless tobacco. She reports that she drinks alcohol. She reports that she does not use illicit drugs.   Family History:  The patient's  family history includes Diabetes in her brother, daughter, and father; Heart disease in her mother; Other in her sister; Rheumatic fever in her sister. There is no history of Colon cancer.    ROS:  Please see the history of present illness.   All other systems are reviewed and negative.    PHYSICAL EXAM: VS:  BP 142/88 mmHg  Pulse 61  Ht 5\' 4"  (1.626 m)  Wt 79.107 kg (174 lb 6.4 oz)  BMI 29.92 kg/m2 , BMI Body mass index is 29.92 kg/(m^2). GEN: Well nourished, well developed, in no acute distress HEENT: normal Neck: no JVD, carotid bruits, or masses Cardiac: RRR; no murmurs, rubs, or gallops,no edema  Respiratory:  clear to auscultation bilaterally, normal work of breathing GI: soft, nontender, nondistended, + BS MS:  no deformity or atrophy Skin: warm and dry  Neuro:  Strength and sensation are intact Psych: euthymic mood, full affect  EKG:  EKG is ordered today. The ekg ordered today shows sinus bradycardia 57 bpm, PR 262, otherwise normal ekg   Recent Labs: 10/28/2013: ALT 16; BUN 16; Creatinine, Ser 0.7; Hemoglobin 12.4; Platelets 256.0; Potassium 4.4; Sodium 125*    Lipid Panel     Component Value Date/Time   TRIG 62 02/28/2013 0132     Wt Readings from Last 3 Encounters:  07/23/14 79.107 kg (174 lb 6.4 oz)  05/29/14 81.251 kg (179 lb 2 oz)  10/28/13 78.472 kg (173 lb)      Other studies Reviewed: Additional studies/  records that were reviewed today include: echo from  [facility] 03/19/14 Review of the above records today demonstrates: EF 60-65%, normal RV function, moderate pulmonary htn (RV pressure 50-55 mm Hg), mild to moderate la enlargement   ASSESSMENT AND PLAN:  1.  Palpitations Unclear etiology Though she carries a diganosis of afib, this has not been well documented.  She complains of palpitations and thinks that she is in afib today.  She is in sinus.  I think that in order to adequately manage her symptoms, better characterization of her arrhythmia is required.  Event monitor has previously shown only pacs.  I would therefore advise more definitive evaluation with implantable loop recorder.  Risks, benefits, and alternatives of implantable loop recorder placement were discussed with patient who wishes to proceed at this time.  2. afib As above  3. Pulmonary hypertension Likely due to chronic lung disease Will refer back to Dr Young for further management.   Current medicines are reviewed at length with the patient today.   The patient does not have concerns regarding her medicines.  The following changes were made today:  none  Labs/ tests ordered today include:  Orders Placed This Encounter  Procedures  . Obtain Consent  . EKG 12-Lead     Signed, Tristan Proto, MD  07/23/2014 2:51 PM     [facility] [address] [place] [address] [phone] (office) [phone] (fax)  

## 2014-07-24 ENCOUNTER — Encounter (HOSPITAL_COMMUNITY): Payer: Self-pay | Admitting: Internal Medicine

## 2014-07-24 NOTE — Telephone Encounter (Signed)
Spoke with Kari [name] at [facility], states that Dr. Johney FrameAllred does not wish for pt to follow up with MW and would rather wait to see CY.  I advised that we have other providers that could see pt before CY that works with pulm htn pts.  Dr. Johney FrameAllred is ok with this.  Pt scheduled for next available with BQ.  Kari [name] states she will make pt aware of appt.  Nothing further needed.

## 2014-07-29 ENCOUNTER — Telehealth: Payer: Self-pay | Admitting: Internal Medicine

## 2014-07-29 NOTE — Telephone Encounter (Signed)
New Message  Pt calling to speak w/ RN. Pt says that keychain device was accidentally washed in the washing machine. Pt wants to know what to do going forward. Please call back and discuss.

## 2014-07-29 NOTE — Telephone Encounter (Signed)
Returned patient's call regarding LINQ symptom activator that was accidentally washed.  Relayed to patient that [employer] representative would either bring new symptom activator to her appointment on 07/31/2014 or would mail her a new symptom activator.  Patient agreeable to this plan, denies any other questions or concerns at this time.

## 2014-07-31 ENCOUNTER — Ambulatory Visit (INDEPENDENT_AMBULATORY_CARE_PROVIDER_SITE_OTHER): Payer: Medicare PPO | Admitting: *Deleted

## 2014-07-31 DIAGNOSIS — R002 Palpitations: Secondary | ICD-10-CM

## 2014-07-31 LAB — CUP PACEART INCLINIC DEVICE CHECK
Date Time Interrogation Session: 20160707115357
MDC IDC SET ZONE DETECTION INTERVAL: 410 ms
Zone Setting Detection Interval: 2000 ms
Zone Setting Detection Interval: 4500 ms

## 2014-07-31 NOTE — Progress Notes (Signed)
Loop wound check in clinic. Steri strips removed, wound well healed, no redness, swelling or drainage. Pt educated about wound care. Pt with no episodes. R waves 0.735mV. Battery- good. ROV with JA 10/27/14 at 11:30.

## 2014-08-12 ENCOUNTER — Encounter: Payer: Self-pay | Admitting: Internal Medicine

## 2014-08-22 ENCOUNTER — Ambulatory Visit (INDEPENDENT_AMBULATORY_CARE_PROVIDER_SITE_OTHER): Payer: Medicare PPO | Admitting: *Deleted

## 2014-08-22 DIAGNOSIS — R002 Palpitations: Secondary | ICD-10-CM | POA: Diagnosis not present

## 2014-08-26 ENCOUNTER — Ambulatory Visit (INDEPENDENT_AMBULATORY_CARE_PROVIDER_SITE_OTHER): Payer: Medicare PPO | Admitting: Pulmonary Disease

## 2014-08-26 ENCOUNTER — Encounter: Payer: Self-pay | Admitting: Pulmonary Disease

## 2014-08-26 VITALS — BP 136/74 | HR 67 | Ht 63.0 in | Wt 176.0 lb

## 2014-08-26 DIAGNOSIS — J449 Chronic obstructive pulmonary disease, unspecified: Secondary | ICD-10-CM

## 2014-08-26 DIAGNOSIS — I272 Pulmonary hypertension, unspecified: Secondary | ICD-10-CM

## 2014-08-26 DIAGNOSIS — I270 Primary pulmonary hypertension: Secondary | ICD-10-CM | POA: Diagnosis not present

## 2014-08-26 NOTE — Assessment & Plan Note (Signed)
I have been asked to see Kari [name] for pulmonary hypertension. She had an echocardiogram earlier this year at an outside facility which showed a PA estimated pressure of 55 mmHg. This was in the setting of a normal RV size and function. Her LV was also normal on that study. I have explained to her that this test is only an estimate and not diagnostic of pulmonary hypertension. She has some chronic-appearing leg swelling but no symptoms currently in terms of shortness of breath. She has been suffering with shortness of breath this year but recently after the change to the new inhaler Virgel Bouquet) all of her symptoms have improved dramatically.  The only way to confirm a diagnosis would be to perform a right heart catheterization but I think that's unnecessary. Elevated PA pressures are commonly seen in octogenarians with multiple heart and lung abnormalities. The likelihood that we would find a treatable pulmonary vasculopathy (WHO group 1) is near 0.  That said I believe she may have some degree of pulmonary hypertension secondary to COPD. She may also have obstructive sleep apnea as she has been told that she snores heavily.  Plan: Check overnight oximetry test, if abnormal will arrange sleep study Continue treatment for COPD with [facility]

## 2014-08-26 NOTE — Progress Notes (Signed)
Subjective:    Patient ID: Kari [name], female    DOB: 04-10-1928, 81 y.o.   MRN: [id number]  HPI Chief Complaint  Patient presents with  . Advice Only    Referred by Dr. Johney Frame for pulm. htn.     Kari [name] has been referred to me for evaluation of pulmonary hypertension.  She has been evaluated by many of my colleagues in the last including Dr. Maple Hudson and Delton Coombes with pulmonary medicine, Dr. Johney Frame with cardiology, and Dr. Rhea Belton with gastroenterology.    She provides a tangential and wandering history heavily laden with religiosity which is quite difficult to follow.    It sounds like she has struggled with Asthma and Acid reflux.  She says that she has had asthma ever since the was a child.  She recently suffered from syncope (I learned this from chart review) she had an implantable event monitor.  She says that she sometime in the last few months she had her inhaled medications changed to [facility] recently and this has been very helpful. She says that she has been walking further and has no longer been dependent on albuterol in form of a rescue inhaler or a nebulizer.  She feels that the Lord gave her Virgel Bouquet and she has been blessed by it.  She has been breathing better lately.  She was diagnosed with sarcoidosis with a skin biopsy (from her nose) several years ago.  She said that she has also been told that she had pulmonary nodules.  She has been followed by multiple pulmonologists for her pulmonary nodules.  She has been told that she had swollen lymph nodes in her lungs after a visit to [employer].  She was told that the lymph nodes decreased in size on her last visit with Dr. Imogene Burn in [facility] several years ago.  Since then she has been followed by my partner Dr. Maple Hudson.  She was being treated for Asthma and COPD which she says is now much improved on Breo.    She has had several episodes of syncope and she says abnormal HR and blood pressure.  Due to this she had the event monitor implanted.  She  says that she has palpitations from time to time which are worse with stress.  She says that the [facility] really made it worse, but now this is better with Breo.    She was found to have a high estimated PA pressure but she had a normal RV size and function. The LV function was normal.  She has never had blood clots in her lungs.  She has never been diagnosed with sleep apnea.  After a recent hotel trip she was told by family that she snores heavily.  She used to have problems with fatigue during the daytime and sleep at night before changing Breo.    Past Medical History  Diagnosis Date  . COPD (chronic obstructive pulmonary disease)   . Thyroid disease   . Osteoporosis   . Sarcoid     pulmonary and dermatologic  . Angina at rest   . MI, old   . Palpitations   . Abdominal pain   . Sarcoidosis, lung   . Atrial fibrillation   . Arthritis   . Asthma   . History of gallstones   . History of scarlet fever   . Premature atrial contractions   . Helicobacter pylori gastritis 11/2011  . GERD (gastroesophageal reflux disease)   . Hiatal hernia   . Zenker's diverticulum  Family History  Problem Relation Age of Onset  . Diabetes Daughter   . Heart disease Mother   . Diabetes Father   . Diabetes Brother   . Other Sister     scarlet fever  . Rheumatic fever Sister   . Colon cancer Neg Hx      History   Social History  . Marital Status: Divorced    Spouse Name: N/A  . Number of Children: 4  . Years of Education: N/A   Occupational History  . clergy    Social History Main Topics  . Smoking status: Former Smoker -- 2.00 packs/day for 10 years    Types: Cigarettes, Pipe    Quit date: 01/25/1968  . Smokeless tobacco: Never Used     Comment: pt unsure of exact month in 1977  . Alcohol Use: 0.0 oz/week    0 Standard drinks or equivalent per week     Comment: seldom...glass wine  . Drug Use: No  . Sexual Activity: Not on file   Other Topics Concern  . Not on file    Social History Narrative   ** Merged History Encounter **         Allergies  Allergen Reactions  . Advair Diskus [Fluticasone-Salmeterol]     Severe muscle pain, Couldn't walk at all  . Aspirin     ONLY IN HIGH DOSES (Swelling, trouble breathing, stomach pain)  . Dulera [Mometasone Furo-Formoterol Fum]     Caused AFIB  . Ivp Dye [Iodinated Diagnostic Agents]     Pt states she thinks she had BP drop-unsure---Pre Treatment pt states she does fine  . Amlodipine Besylate Swelling  . Codeine Nausea And Vomiting and Other (See Comments)    "goes crazy"  . Morphine And Related     "goes crazy"  . Other     Narcotics - reverse effect on her, couldn't sleep either  . Penicillins   . Symbicort [Budesonide-Formoterol Fumarate]     Leg cramps  . Tramadol Hives     Outpatient Prescriptions Prior to Visit  Medication Sig Dispense Refill  . albuterol (PROVENTIL HFA;VENTOLIN HFA) 108 (90 BASE) MCG/ACT inhaler Inhale 2 puffs into the lungs every 6 (six) hours as needed for wheezing or shortness of breath.    Marland Kitchen aspirin 81 MG tablet Take 81 mg by mouth daily.    . Calcium Carbonate-Vitamin D (CALTRATE 600+D) 600-400 MG-UNIT per tablet Take 1 tablet by mouth daily.    . Cholecalciferol 2000 UNITS TABS Take 2,000 Units by mouth daily.    Marland Kitchen levothyroxine (SYNTHROID, LEVOTHROID) 112 MCG tablet Take 112 mcg by mouth daily before breakfast.    . multivitamin (THERAGRAN) per tablet Take 1 tablet by mouth daily.    . potassium chloride (K-DUR) 10 MEQ tablet Take 10 mEq by mouth daily.    . potassium chloride SA (K-DUR,KLOR-CON) 20 MEQ tablet Take 2 tablets (40 mEq total) by mouth daily.    . ranitidine (ZANTAC) 300 MG tablet Take 1 tablet by mouth at bedtime.    Marland Kitchen BYSTOLIC 5 MG tablet Take 5 mg by mouth daily.    . furosemide (LASIX) 20 MG tablet Take 20 mg by mouth 2 (two) times daily.      No facility-administered medications prior to visit.       Review of Systems  Constitutional:  Negative for fever and unexpected weight change.  HENT: Negative for congestion, dental problem, ear pain, nosebleeds, postnasal drip, rhinorrhea, sinus pressure, sneezing, sore throat and  trouble swallowing.   Eyes: Negative for redness and itching.  Respiratory: Positive for shortness of breath. Negative for cough, chest tightness and wheezing.   Cardiovascular: Negative for palpitations and leg swelling.  Gastrointestinal: Positive for abdominal pain. Negative for nausea and vomiting.  Genitourinary: Negative for dysuria.  Musculoskeletal: Positive for joint swelling.  Skin: Negative for rash.  Neurological: Positive for headaches.  Hematological: Does not bruise/bleed easily.  Psychiatric/Behavioral: Negative for dysphoric mood. The patient is not nervous/anxious.        Objective:   Physical Exam Filed Vitals:   08/26/14 1539  BP: 136/74  Pulse: 67  Height: 5\' 3"  (1.6 m)  Weight: 176 lb (79.833 kg)  SpO2: 97%   RA  Gen: well appearing, no acute distress HENT: NCAT, OP clear, neck supple without masses Eyes: PERRL, EOMi Lymph: no cervical lymphadenopathy PULM: CTA B CV: RRR, no mgr, no JVD, chronic-appearing leg edema with chronic venous stasis changes GI: BS+, soft, nontender, no hsm Derm: no rash or skin breakdown MSK: normal bulk and tone Neuro: A&Ox4, CN II-XII intact, strength 5/5 in all 4 extremities Psyche: normal mood and affect  PFT 05-2012-moderate obstructive airways disease with little response to bronchodilator, air trapping, normal diffusion. FVC 2.34/97%, FEV1 1.32/81%, FEV1/FVC 0.56, RV 147%, DLCO 90%. Records from Dr. Tillie Rung were reviewed where she recently had an implantable event monitor.      Assessment & Plan:  Pulmonary hypertension I have been asked to see Kari [name] for pulmonary hypertension. She had an echocardiogram earlier this year at an outside facility which showed a PA estimated pressure of 55 mmHg. This was in the setting of a normal  RV size and function. Her LV was also normal on that study. I have explained to her that this test is only an estimate and not diagnostic of pulmonary hypertension. She has some chronic-appearing leg swelling but no symptoms currently in terms of shortness of breath. She has been suffering with shortness of breath this year but recently after the change to the new inhaler Virgel Bouquet) all of her symptoms have improved dramatically.  The only way to confirm a diagnosis would be to perform a right heart catheterization but I think that's unnecessary. Elevated PA pressures are commonly seen in octogenarians with multiple heart and lung abnormalities. The likelihood that we would find a treatable pulmonary vasculopathy (WHO group 1) is near 0.  That said I believe she may have some degree of pulmonary hypertension secondary to COPD. She may also have obstructive sleep apnea as she has been told that she snores heavily.  Plan: Check overnight oximetry test, if abnormal will arrange sleep study Continue treatment for COPD with Breo   COPD (chronic obstructive pulmonary disease) This has dramatically improved since changing her inhaled therapy to [facility].  We will continue Brio for now.     Current outpatient prescriptions:  .  albuterol (PROVENTIL HFA;VENTOLIN HFA) 108 (90 BASE) MCG/ACT inhaler, Inhale 2 puffs into the lungs every 6 (six) hours as needed for wheezing or shortness of breath., Disp: , Rfl:  .  aspirin 81 MG tablet, Take 81 mg by mouth daily., Disp: , Rfl:  .  Calcium Carbonate-Vitamin D (CALTRATE 600+D) 600-400 MG-UNIT per tablet, Take 1 tablet by mouth daily., Disp: , Rfl:  .  Cholecalciferol 2000 UNITS TABS, Take 2,000 Units by mouth daily., Disp: , Rfl:  .  Fluticasone Furoate-Vilanterol (BREO ELLIPTA) 200-25 MCG/INH AEPB, Inhale 1 puff into the lungs daily., Disp: , Rfl:  .  hydrochlorothiazide (HYDRODIURIL) 25 MG tablet, Take 25 mg by mouth daily., Disp: , Rfl:  .  levothyroxine  (SYNTHROID, LEVOTHROID) 112 MCG tablet, Take 112 mcg by mouth daily before breakfast., Disp: , Rfl:  .  metoprolol tartrate (LOPRESSOR) 25 MG tablet, Take 12.5 mg by mouth 2 (two) times daily., Disp: , Rfl:  .  multivitamin (THERAGRAN) per tablet, Take 1 tablet by mouth daily., Disp: , Rfl:  .  potassium chloride (K-DUR) 10 MEQ tablet, Take 10 mEq by mouth daily., Disp: , Rfl:  .  potassium chloride SA (K-DUR,KLOR-CON) 20 MEQ tablet, Take 2 tablets (40 mEq total) by mouth daily., Disp: , Rfl:  .  ranitidine (ZANTAC) 300 MG tablet, Take 1 tablet by mouth at bedtime., Disp: , Rfl:

## 2014-08-26 NOTE — Patient Instructions (Signed)
Keep taking the [facility] as you are doing, we will change the prescription for you with our office We will order a test to measure your oxygen while you sleep.   Come back to see Dr. Maple Hudson as previously scheduled.

## 2014-08-26 NOTE — Assessment & Plan Note (Signed)
This has dramatically improved since changing her inhaled therapy to [facility].  We will continue Brio for now.

## 2014-08-27 NOTE — Progress Notes (Signed)
Loop recorder 

## 2014-08-28 ENCOUNTER — Telehealth: Payer: Self-pay | Admitting: Internal Medicine

## 2014-08-28 NOTE — Telephone Encounter (Signed)
Pt wants to be referred to surgeon for hiatal hernia. Please advise.

## 2014-09-02 NOTE — Telephone Encounter (Signed)
Surgery will have to be thoroughly discussed with the surgeon before proceeding given her age and other medical conditions Would recommend [facility], Dr. Lenis Noon or Dr. Marilynn Rail (please make sure when referring that they are still taking patient for hiatal hernia repairs)

## 2014-09-02 NOTE — Telephone Encounter (Signed)
Pt scheduled to see Dr. Marilynn Rail at [facility] 10/08/14@2 :30pm. Records faxed to [phone]. Left message for pt to call back.  Pt states it is not her hiatal hernia. States it is a hernia lower in her abdomen/groin area. Pt states she wants to see a doctor in [place]. Discussed with pt that she should have her PCP refer her to a doctor there. Pt verbalized understanding. Appt at [facility] cancelled.

## 2014-09-04 ENCOUNTER — Encounter: Payer: Self-pay | Admitting: Internal Medicine

## 2014-09-04 ENCOUNTER — Ambulatory Visit (INDEPENDENT_AMBULATORY_CARE_PROVIDER_SITE_OTHER): Payer: Medicare PPO | Admitting: *Deleted

## 2014-09-04 DIAGNOSIS — I480 Paroxysmal atrial fibrillation: Secondary | ICD-10-CM

## 2014-09-04 NOTE — Telephone Encounter (Signed)
Pt states it is not her hiatal hernia but a hernia in her groin/abdomen area. Pt requests a Careers adviser in the [facility] area. Discussed with pt that she should get her PCP there to refer her to a surgeon in [facility]. Pt verbalized understanding and appt at [facility] cancelled.

## 2014-09-04 NOTE — Progress Notes (Signed)
ILR wound check appt. Incision edges remain approximated. No edema, ecchymosis, or drainage present. No changes. Patient to follow up as scheduled.

## 2014-09-05 LAB — CUP PACEART REMOTE DEVICE CHECK: MDC IDC SESS DTM: 20160812133125

## 2014-09-10 ENCOUNTER — Ambulatory Visit (INDEPENDENT_AMBULATORY_CARE_PROVIDER_SITE_OTHER): Payer: Medicare PPO | Admitting: *Deleted

## 2014-09-10 ENCOUNTER — Telehealth: Payer: Self-pay

## 2014-09-10 DIAGNOSIS — R002 Palpitations: Secondary | ICD-10-CM

## 2014-09-10 DIAGNOSIS — I480 Paroxysmal atrial fibrillation: Secondary | ICD-10-CM

## 2014-09-10 LAB — CUP PACEART INCLINIC DEVICE CHECK
Date Time Interrogation Session: 20160817135642
MDC IDC SET ZONE DETECTION INTERVAL: 4500 ms
Zone Setting Detection Interval: 2000 ms
Zone Setting Detection Interval: 410 ms

## 2014-09-10 NOTE — Telephone Encounter (Signed)
-----   Message from Lupita Leash, MD sent at 09/09/2014  8:43 AM EDT ----- A, Please let her know that her ONO was essentially normal.  She dropped her O2 saturation for about 15 minutes, but for the rest of the night it was normal.  We can decide whether or not she needs O2 at night on the next visit. Thanks B

## 2014-09-10 NOTE — Progress Notes (Signed)
Loop check in clinic. Pt with 0 tachy episodes; 2 brady episodes---undersensing; 0 asystole; 0 symptom episodes; 0 AF episodes. Sensitivity increased from 0.065mV to 0.027mV. Plan to follow up as scheduled.

## 2014-09-10 NOTE — Telephone Encounter (Signed)
lmtcb X1 for pt.  Will need to schedule rov for pt as she has no pending appt or recall.

## 2014-09-11 ENCOUNTER — Encounter: Payer: Self-pay | Admitting: Internal Medicine

## 2014-09-18 ENCOUNTER — Encounter: Payer: Self-pay | Admitting: Internal Medicine

## 2014-09-22 ENCOUNTER — Ambulatory Visit (INDEPENDENT_AMBULATORY_CARE_PROVIDER_SITE_OTHER): Payer: Medicare PPO | Admitting: *Deleted

## 2014-09-22 DIAGNOSIS — R002 Palpitations: Secondary | ICD-10-CM | POA: Diagnosis not present

## 2014-09-24 NOTE — Progress Notes (Signed)
Loop recorder 

## 2014-10-03 ENCOUNTER — Encounter: Payer: Self-pay | Admitting: Pulmonary Disease

## 2014-10-03 LAB — CUP PACEART REMOTE DEVICE CHECK: MDC IDC SESS DTM: 20160909123806

## 2014-10-03 NOTE — Progress Notes (Signed)
Carelink summary report received. Battery status OK. Normal device function. No new symptom episodes, tachy episodes, brady, or pause episodes. No new AF episodes. Monthly summary reports and ROV with JA on 10/27/2014 at 11:30am.

## 2014-10-06 ENCOUNTER — Encounter: Payer: Self-pay | Admitting: Internal Medicine

## 2014-10-21 ENCOUNTER — Ambulatory Visit (INDEPENDENT_AMBULATORY_CARE_PROVIDER_SITE_OTHER): Payer: Medicare PPO | Admitting: *Deleted

## 2014-10-21 DIAGNOSIS — R002 Palpitations: Secondary | ICD-10-CM

## 2014-10-23 NOTE — Progress Notes (Signed)
Loop recorder 

## 2014-10-27 ENCOUNTER — Encounter: Payer: Self-pay | Admitting: Internal Medicine

## 2014-10-27 ENCOUNTER — Ambulatory Visit (INDEPENDENT_AMBULATORY_CARE_PROVIDER_SITE_OTHER): Payer: Medicare PPO | Admitting: Internal Medicine

## 2014-10-27 VITALS — BP 140/78 | HR 61 | Ht 63.0 in | Wt 176.4 lb

## 2014-10-27 DIAGNOSIS — I272 Other secondary pulmonary hypertension: Secondary | ICD-10-CM

## 2014-10-27 DIAGNOSIS — I480 Paroxysmal atrial fibrillation: Secondary | ICD-10-CM

## 2014-10-27 DIAGNOSIS — R002 Palpitations: Secondary | ICD-10-CM | POA: Diagnosis not present

## 2014-10-27 LAB — CUP PACEART REMOTE DEVICE CHECK: Date Time Interrogation Session: 20160927200527

## 2014-10-27 LAB — CUP PACEART INCLINIC DEVICE CHECK
Date Time Interrogation Session: 20161003124341
MDC IDC SET ZONE DETECTION INTERVAL: 410 ms
Zone Setting Detection Interval: 2000 ms
Zone Setting Detection Interval: 4500 ms

## 2014-10-27 NOTE — Patient Instructions (Signed)
Medication Instructions:  Your physician recommends that you continue on your current medications as directed. Please refer to the Current Medication list given to you today.   Labwork: None ordered   Testing/Procedures: None ordered   Follow-Up: Your physician recommends that you schedule a follow-up appointment in: 3 months with Dr Johney Frame   Any Other Special Instructions Will Be Listed Below (If Applicable).  Low-Sodium Eating Plan Sodium raises blood pressure and causes water to be held in the body. Getting less sodium from food will help lower your blood pressure, reduce any swelling, and protect your heart, liver, and kidneys. We get sodium by adding salt (sodium chloride) to food. Most of our sodium comes from canned, boxed, and frozen foods. Restaurant foods, fast foods, and pizza are also very high in sodium. Even if you take medicine to lower your blood pressure or to reduce fluid in your body, getting less sodium from your food is important. WHAT IS MY PLAN? Most people should limit their sodium intake to 2,300 mg a day. Your health care provider recommends that you limit your sodium intake to 2 grams  a day.  WHAT DO I NEED TO KNOW ABOUT THIS EATING PLAN? For the low-sodium eating plan, you will follow these general guidelines:  Choose foods with a % Daily Value for sodium of less than 5% (as listed on the food label).   Use salt-free seasonings or herbs instead of table salt or sea salt.   Check with your health care provider or pharmacist before using salt substitutes.   Eat fresh foods.  Eat more vegetables and fruits.  Limit canned vegetables. If you do use them, rinse them well to decrease the sodium.   Limit cheese to 1 oz (28 g) per day.   Eat lower-sodium products, often labeled as "lower sodium" or "no salt added."  Avoid foods that contain monosodium glutamate (MSG). MSG is sometimes added to [place] food and some canned foods.  Check food  labels (Nutrition Facts labels) on foods to learn how much sodium is in one serving.  Eat more home-cooked food and less restaurant, buffet, and fast food.  When eating at a restaurant, ask that your food be prepared with less salt or none, if possible.  HOW DO I READ FOOD LABELS FOR SODIUM INFORMATION? The Nutrition Facts label lists the amount of sodium in one serving of the food. If you eat more than one serving, you must multiply the listed amount of sodium by the number of servings. Food labels may also identify foods as:  Sodium free--Less than 5 mg in a serving.  Very low sodium--35 mg or less in a serving.  Low sodium--140 mg or less in a serving.  Light in sodium--50% less sodium in a serving. For example, if a food that usually has 300 mg of sodium is changed to become light in sodium, it will have 150 mg of sodium.  Reduced sodium--25% less sodium in a serving. For example, if a food that usually has 400 mg of sodium is changed to reduced sodium, it will have 300 mg of sodium. WHAT FOODS CAN I EAT? Grains Low-sodium cereals, including oats, puffed wheat and rice, and shredded wheat cereals. Low-sodium crackers. Unsalted rice and pasta. Lower-sodium bread.  Vegetables Frozen or fresh vegetables. Low-sodium or reduced-sodium canned vegetables. Low-sodium or reduced-sodium tomato sauce and paste. Low-sodium or reduced-sodium tomato and vegetable juices.  Fruits Fresh, frozen, and canned fruit. Fruit juice.  Meat and Other Protein Products Low-sodium  canned tuna and salmon. Fresh or frozen meat, poultry, seafood, and fish. Lamb. Unsalted nuts. Dried beans, peas, and lentils without added salt. Unsalted canned beans. Homemade soups without salt. Eggs.  Dairy Milk. Soy milk. Ricotta cheese. Low-sodium or reduced-sodium cheeses. Yogurt.  Condiments Fresh and dried herbs and spices. Salt-free seasonings. Onion and garlic powders. Low-sodium varieties of mustard and ketchup.  Lemon juice.  Fats and Oils Reduced-sodium salad dressings. Unsalted butter.  Other Unsalted popcorn and pretzels.  The items listed above may not be a complete list of recommended foods or beverages. Contact your dietitian for more options. WHAT FOODS ARE NOT RECOMMENDED? Grains Instant hot cereals. Bread stuffing, pancake, and biscuit mixes. Croutons. Seasoned rice or pasta mixes. Noodle soup cups. Boxed or frozen macaroni and cheese. Self-rising flour. Regular salted crackers. Vegetables Regular canned vegetables. Regular canned tomato sauce and paste. Regular tomato and vegetable juices. Frozen vegetables in sauces. Salted french fries. Olives. Rosita Fire. Relishes. Sauerkraut. Salsa. Meat and Other Protein Products Salted, canned, smoked, spiced, or pickled meats, seafood, or fish. Bacon, ham, sausage, hot dogs, corned beef, chipped beef, and packaged luncheon meats. Salt pork. Jerky. Pickled herring. Anchovies, regular canned tuna, and sardines. Salted nuts. Dairy Processed cheese and cheese spreads. Cheese curds. Blue cheese and cottage cheese. Buttermilk.  Condiments Onion and garlic salt, seasoned salt, table salt, and sea salt. Canned and packaged gravies. Worcestershire sauce. Tartar sauce. Barbecue sauce. Teriyaki sauce. Soy sauce, including reduced sodium. Steak sauce. Fish sauce. Oyster sauce. Cocktail sauce. Horseradish. Regular ketchup and mustard. Meat flavorings and tenderizers. Bouillon cubes. Hot sauce. Tabasco sauce. Marinades. Taco seasonings. Relishes. Fats and Oils Regular salad dressings. Salted butter. Margarine. Ghee. Bacon fat.  Other Potato and tortilla chips. Corn chips and puffs. Salted popcorn and pretzels. Canned or dried soups. Pizza. Frozen entrees and pot pies.  The items listed above may not be a complete list of foods and beverages to avoid. Contact your dietitian for more information. Document Released: 07/02/2001 Document Revised: 01/15/2013  Document Reviewed: 11/14/2012 [facility] Patient Information 2015 [place], [place]. This information is not intended to replace advice given to you by your health care provider. Make sure you discuss any questions you have with your health care provider.

## 2014-10-27 NOTE — Progress Notes (Signed)
Carelink summary report received. Battery status OK. Normal device function. No new symptom episodes, tachy episodes, brady, or pause episodes. No new AF episodes. Monthly summary reports and ROV w/ JA 10/27/14.  

## 2014-10-28 DIAGNOSIS — R002 Palpitations: Secondary | ICD-10-CM | POA: Insufficient documentation

## 2014-10-28 NOTE — Progress Notes (Signed)
Electrophysiology Office Note   Date:  10/28/2014   ID:  Kari [name], DOB 13-May-1928, MRN [id number]  PCP:  PROVIDER NOT IN SYSTEM   Primary Electrophysiologist: [name], MD    Chief Complaint  Patient presents with  . PAF  . Palpitations     History of Present Illness: Kari [name] is a 81 y.o. female who presents today for electrophysiology evaluation.   She continues to have palpitations which she finds concerning.  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  She remains active for her age.  She recently presented to the hospital in her home town and had an echo which revealed preserved EF with pulmonary hypertension.  Today, she denies symptoms of  chest pain,  orthopnea, PND, lower extremity edema, claudication, dizziness, presyncope, syncope, bleeding, or neurologic sequela. The patient is tolerating medications without difficulties and is otherwise without complaint today.    Past Medical History  Diagnosis Date  . COPD (chronic obstructive pulmonary disease) (HCC)   . Thyroid disease   . Osteoporosis   . Sarcoid (HCC)     pulmonary and dermatologic  . Angina at rest [facility])   . MI, old   . Palpitations   . Abdominal pain   . Sarcoidosis, lung (HCC)   . Atrial fibrillation (HCC)   . Arthritis   . Asthma   . History of gallstones   . History of scarlet fever   . Premature atrial contractions   . Helicobacter pylori gastritis 11/2011  . GERD (gastroesophageal reflux disease)   . Hiatal hernia   . Zenker's diverticulum    Past Surgical History  Procedure Laterality Date  . Thyroidectomy    . Cataract extraction Bilateral   . Colonoscopy    . Cardiolite  2000  . Cholecystectomy  04/1996  . Abdominal hysterectomy  10/1997  . Hernia repair      inguinal and hiatal/abdominal  . Leg surgery      right leg...rod/screws  . Ep implantable device N/A 07/23/2014    Procedure: Loop Recorder Insertion;  Surgeon: [name], MD;   [facility]: [facility];  Service: Cardiovascular;  Laterality: N/A;     Current Outpatient Prescriptions  Medication Sig Dispense Refill  . albuterol (PROVENTIL HFA;VENTOLIN HFA) 108 (90 BASE) MCG/ACT inhaler Inhale 2 puffs into the lungs every 6 (six) hours as needed for wheezing or shortness of breath.    . Calcium Carbonate-Vitamin D (CALTRATE 600+D) 600-400 MG-UNIT per tablet Take 1 tablet by mouth daily.    . Cholecalciferol 2000 UNITS TABS Take 2,000 Units by mouth daily.    . Fluticasone Furoate-Vilanterol (BREO ELLIPTA) 200-25 MCG/INH AEPB Inhale 1 puff into the lungs daily.    . hydrochlorothiazide (HYDRODIURIL) 25 MG tablet Take 25 mg by mouth daily.    Marland Kitchen levothyroxine (SYNTHROID, LEVOTHROID) 112 MCG tablet Take 112 mcg by mouth daily before breakfast.    . metoprolol tartrate (LOPRESSOR) 25 MG tablet Take 12.5 mg by mouth 2 (two) times daily.    . multivitamin (THERAGRAN) per tablet Take 1 tablet by mouth daily.    . potassium chloride (K-DUR) 10 MEQ tablet Take 10 mEq by mouth daily.    . ranitidine (ZANTAC) 300 MG tablet Take 1 tablet by mouth at bedtime.    . sucralfate (CARAFATE) 1 G tablet Take 1 g by mouth as directed.     No current facility-administered medications for this visit.    Allergies:   Advair diskus;  Aspirin; Dulera; Ivp dye; Amlodipine besylate; Azithromycin; Codeine; Morphine and related; Other; Penicillins; Prevacid ; Symbicort; and Tramadol   Social History:  The patient  reports that she quit smoking about 46 years ago. Her smoking use included Cigarettes and Pipe. She has a 20 pack-year smoking history. She has never used smokeless tobacco. She reports that she drinks alcohol. She reports that she does not use illicit drugs.   Family History:  The patient's  family history includes Diabetes in her brother, daughter, and father; Heart disease in her mother; Other in her sister; Rheumatic fever in her sister. There is no history of Colon cancer.     ROS:  Please see the history of present illness.   All other systems are reviewed and negative.    PHYSICAL EXAM: VS:  BP 140/78 mmHg  Pulse 61  Ht  (1.6 m)  Wt 176 lb 6.4 oz (80.015 kg)  BMI 31.26 kg/m2 , BMI Body mass index is 31.26 kg/(m^2). GEN: Well nourished, well developed, in no acute distress HEENT: normal Neck: no JVD, carotid bruits, or masses Cardiac: RRR; no murmurs, rubs, or gallops,+1 edema  Respiratory:  clear to auscultation bilaterally, normal work of breathing GI: soft, nontender, nondistended, + BS MS: no deformity or atrophy Skin: warm and dry  Neuro:  Strength and sensation are intact Psych: euthymic mood, full affect  EKG:  EKG is ordered today. The ekg ordered today shows sinus rhythm 61 bpm, PR 280 msec, nonspecific ST/T changes, anterior infarct pattern  ILR monitor reveals no arrhythmias   Recent Labs: No results found for requested labs within last 365 days.    Lipid Panel     Component Value Date/Time   TRIG 62 02/28/2013 0132     Wt Readings from Last 3 Encounters:  10/27/14 176 lb 6.4 oz (80.015 kg)  08/26/14 176 lb (79.833 kg)  07/23/14 174 lb (78.926 kg)     ASSESSMENT AND PLAN:  1.  Palpitations Unclear etiology Review of her ILR today reveals no arrhythmias since implant Today, I discussed of the symptomatic activator with her device.  Should she have symptoms concerning for arrhythmia, I would like for her to use her activator.    2. afib None documented on ILR  3. Pulmonary hypertension Likely due to chronic lung disease I appreciate Dr Ulyses Jarred input  4. HTN, edema Sodium restriction advised She has venous insufficiency but does not want to wear support hose   Current medicines are reviewed at length with the patient today.   The patient does not have concerns regarding her medicines.  The following changes were made today:  none  Labs/ tests ordered today include:  Orders Placed This Encounter   Procedures  . Implantable device check   Return to see me in 3 months If no arrhythmias on ILR at that time, will follow remotely and in the office annually  Signed, [name], MD  10/28/2014 10:30 PM     [facility] [address] [place] [address] [phone] (office) [phone] (fax)

## 2014-11-11 ENCOUNTER — Encounter: Payer: Self-pay | Admitting: Internal Medicine

## 2014-11-14 ENCOUNTER — Encounter: Payer: Self-pay | Admitting: Internal Medicine

## 2014-11-18 ENCOUNTER — Telehealth: Payer: Self-pay | Admitting: Internal Medicine

## 2014-11-18 NOTE — Telephone Encounter (Signed)
Pt states she is having problems with abdominal pain and needs to be seen sooner than she can get in with Dr. Rhea BeltonPyrtle. Pt scheduled to see Amy Esterwood PA 11/25/14@3pm . Pt aware of appt.

## 2014-11-20 ENCOUNTER — Ambulatory Visit (INDEPENDENT_AMBULATORY_CARE_PROVIDER_SITE_OTHER): Payer: Medicare PPO | Admitting: *Deleted

## 2014-11-20 DIAGNOSIS — R002 Palpitations: Secondary | ICD-10-CM

## 2014-11-21 NOTE — Progress Notes (Signed)
Loop recorder 

## 2014-11-25 ENCOUNTER — Encounter: Payer: Self-pay | Admitting: Physician Assistant

## 2014-11-25 ENCOUNTER — Other Ambulatory Visit (INDEPENDENT_AMBULATORY_CARE_PROVIDER_SITE_OTHER): Payer: Medicare PPO

## 2014-11-25 ENCOUNTER — Ambulatory Visit (INDEPENDENT_AMBULATORY_CARE_PROVIDER_SITE_OTHER): Payer: Medicare PPO | Admitting: Physician Assistant

## 2014-11-25 VITALS — BP 124/70 | HR 80 | Ht 63.0 in | Wt 178.5 lb

## 2014-11-25 DIAGNOSIS — R194 Change in bowel habit: Secondary | ICD-10-CM

## 2014-11-25 DIAGNOSIS — R1031 Right lower quadrant pain: Secondary | ICD-10-CM | POA: Diagnosis not present

## 2014-11-25 DIAGNOSIS — R1032 Left lower quadrant pain: Secondary | ICD-10-CM

## 2014-11-25 LAB — CBC WITH DIFFERENTIAL/PLATELET
BASOS ABS: 0 10*3/uL (ref 0.0–0.1)
Basophils Relative: 0.9 % (ref 0.0–3.0)
Eosinophils Absolute: 0.1 10*3/uL (ref 0.0–0.7)
Eosinophils Relative: 2.1 % (ref 0.0–5.0)
HCT: 38 % (ref 36.0–46.0)
Hemoglobin: 12.8 g/dL (ref 12.0–15.0)
LYMPHS ABS: 1 10*3/uL (ref 0.7–4.0)
Lymphocytes Relative: 25.3 % (ref 12.0–46.0)
MCHC: 33.7 g/dL (ref 30.0–36.0)
MCV: 93.5 fl (ref 78.0–100.0)
Monocytes Absolute: 0.6 10*3/uL (ref 0.1–1.0)
Monocytes Relative: 14.6 % — ABNORMAL HIGH (ref 3.0–12.0)
NEUTROS PCT: 57.1 % (ref 43.0–77.0)
Neutro Abs: 2.3 10*3/uL (ref 1.4–7.7)
Platelets: 239 10*3/uL (ref 150.0–400.0)
RBC: 4.07 Mil/uL (ref 3.87–5.11)
RDW: 13.2 % (ref 11.5–15.5)
WBC: 4.1 10*3/uL (ref 4.0–10.5)

## 2014-11-25 LAB — BASIC METABOLIC PANEL
BUN: 20 mg/dL (ref 6–23)
CALCIUM: 9.7 mg/dL (ref 8.4–10.5)
CHLORIDE: 97 meq/L (ref 96–112)
CO2: 32 mEq/L (ref 19–32)
Creatinine, Ser: 0.87 mg/dL (ref 0.40–1.20)
GFR: 65.82 mL/min (ref >60.00)
Glucose, Bld: 97 mg/dL (ref 70–99)
Potassium: 4.1 mEq/L (ref 3.5–5.1)
Sodium: 134 mEq/L — ABNORMAL LOW (ref 135–145)

## 2014-11-25 NOTE — Progress Notes (Addendum)
Patient ID: Kari [name], female   DOB: [birth year], 81 y.o.   MRN: [id number]   Subjective:    Patient ID: Kari [name], female    DOB: [birth year], 81 y.o.   MRN: [id number]  HPI  Kari [name] is a very nice 81 year old white female known to Dr.Pyrtle who was last seen in the office in May 2016. She does have history of chronic GERD urge hiatal hernia and small Zenker's identified on upper GI in 2015. She also has history of atrial fibrillation, COPD, pulmonary hypertension, prior cholecystectomy, history of sarcoidosis read Patient comes in today with complaints of change in her bowel habits with constipation and lower abdominal discomfort. She says the symptoms have been present over the past several days. She started taking Metamucil and is been having an increase in stooling but still passing small pellet-like stools. She says she feels less uncomfortable since been on Metamucil. She's not had any fever or chills, no melena or hematochezia. She also tells me about an area on her right flank which is been described to her as a ruptured muscle or hernia which occurred after a serious cardiac accident in 2015. She had been evaluated by a Dr. Theresia Boughobert [name] a surgeon in [name] who apparently imaged this area and also told her that there was a small hernia there. She says this seems to bother her with long driving him were she is tired. She's not certain whether this has anything to do with her lower abdominal pain but wants to be to know about this symptom. CT imaging from February 2015 did show diverticulosis. She has not had any prior episodes of diverticulitis  Review of Systems Pertinent positive and negative review of systems were noted in the above HPI section.  All other review of systems was otherwise negative.  Outpatient Encounter Prescriptions as of 11/25/2014  Medication Sig  . albuterol (PROVENTIL HFA;VENTOLIN HFA) 108 (90 BASE) MCG/ACT inhaler Inhale 2 puffs into the lungs every 6 (six) hours  as needed for wheezing or shortness of breath.  . Calcium Carbonate-Vitamin D (CALTRATE 600+D) 600-400 MG-UNIT per tablet Take 1 tablet by mouth daily.  . Cholecalciferol 2000 UNITS TABS Take 2,000 Units by mouth daily.  . Fluticasone Furoate-Vilanterol (BREO ELLIPTA) 200-25 MCG/INH AEPB Inhale 1 puff into the lungs daily.  . hydrochlorothiazide (HYDRODIURIL) 25 MG tablet Take 25 mg by mouth daily.  Marland Kitchen. levothyroxine (SYNTHROID, LEVOTHROID) 112 MCG tablet Take 112 mcg by mouth daily before breakfast.  . metoprolol tartrate (LOPRESSOR) 25 MG tablet Take 12.5 mg by mouth 2 (two) times daily.  . multivitamin (THERAGRAN) per tablet Take 1 tablet by mouth daily.  . potassium chloride (K-DUR) 10 MEQ tablet Take 10 mEq by mouth daily.  . ranitidine (ZANTAC) 300 MG tablet Take 1 tablet by mouth at bedtime.  . sucralfate (CARAFATE) 1 G tablet Take 1 g by mouth as directed.  . furosemide (LASIX) 20 MG tablet Take 20 mg by mouth as needed.   No facility-administered encounter medications on file as of 11/25/2014.   Allergies  Allergen Reactions  . Advair Diskus [Fluticasone-Salmeterol]     Severe muscle pain, Couldn't walk at all  . Aspirin     ONLY IN HIGH DOSES (Swelling, trouble breathing, stomach pain)  . Dulera [Mometasone Furo-Formoterol Fum]     Caused AFIB  . Ivp Dye [Iodinated Diagnostic Agents]     Pt states she thinks she had BP drop-unsure---Pre Treatment pt states she does fine  . Amlodipine Besylate Swelling  .  Azithromycin     Other reaction(s): Other - See Comments irreg pulse  . Codeine Nausea And Vomiting and Other (See Comments)    "goes crazy"  . Morphine And Related     "goes crazy"  . Other     Narcotics - reverse effect on her, couldn't sleep either  . Penicillins   . Prevacid  [Lansoprazole]   . Symbicort [Budesonide-Formoterol Fumarate]     Leg cramps  . Tramadol Hives   Patient Active Problem List   Diagnosis Date Noted  . Palpitation 10/28/2014  . Pulmonary  hypertension (HCC) 08/26/2014  . Atrial fibrillation (HCC) 07/23/2014  . Abdominal pain, epigastric 04/18/2013  . Fall 03/01/2013  . Multiple fractures of ribs of right side 03/01/2013  . Acute respiratory failure (HCC) 03/01/2013  . Adrenal hemorrhage (HCC) 03/01/2013  . Sternal fracture 03/01/2013  . COPD (chronic obstructive pulmonary disease) (HCC) 03/01/2013  . Pulmonary contusion 03/01/2013  . Flail chest 02/27/2013  . Allergic rhinitis 11/25/2012  . GERD (gastroesophageal reflux disease) 10/07/2011  . Hiatal hernia 10/07/2011  . Asthma with COPD (HCC) 10/07/2011  . Sarcoidosis of lung (HCC) 10/07/2011  . First degree atrioventricular block 08/30/2011  . INTERMITTENT VERTIGO 02/23/2009  . MITRAL VALVE INSUFF&AORTIC VALVE INSUFF 01/26/2009  . ATRIAL FLUTTER 01/26/2009  . PALPITATIONS 01/26/2009   Social History   Social History  . Marital Status: Divorced    Spouse Name: N/A  . Number of Children: 4  . Years of Education: N/A   Occupational History  . clergy    Social History Main Topics  . Smoking status: Former Smoker -- 2.00 packs/day for 10 years    Types: Cigarettes, Pipe    Quit date: 01/25/1968  . Smokeless tobacco: Never Used     Comment: pt unsure of exact month in 1977  . Alcohol Use: 0.0 oz/week    0 Standard drinks or equivalent per week     Comment: seldom...glass wine  . Drug Use: No  . Sexual Activity: Not on file   Other Topics Concern  . Not on file   Social History Narrative   ** Merged History Encounter **        Kari [name]'s family history includes Diabetes in her brother, daughter, and father; Heart disease in her mother; Other in her sister; Rheumatic fever in her sister. There is no history of Colon cancer.      Objective:    Filed Vitals:   11/25/14 1506  BP: 124/70  Pulse: 80    Physical Exam  well-developed elderly white female in no acute distress, very pleasant blood pressure 124/70 pulse 80 height 5 foot 3 weight  178. HEENT ;nontraumatic normocephalic EOMI PERRLA sclera anicteric, Cardiovascular; regular rate and rhythm with S1-S2 no murmur or gallop, Pulmonary; clear bilaterally, Abdomen ;large soft she has some mild tenderness in the suprapubic area, and bilateral lower quadrants, there is no guarding or rebound, no palpable mass or hepatosplenomegaly bowel sounds are present, also on her right flank she has a soft nontender protrusion measuring about for 5 cm underneath a small incision. Rectal; exam not done, Extremities ;no clubbing cyanosis or edema skin warm and dry, Neuropsych; mood and affect appropriate       Assessment & Plan:   #1 81 yo female with several day hx of lower abdominal pain, and constipation- r/o diverticulitis, vs other intra abdominal inflammatory process. #2 Right flank discomfort - ? ruptured muscle. #3 chronic GERD #4 sarcoidosis #5 atrial fib #6 COPD  Plan; start Miralax 17 gm in 8 oz of water daily CBC, BMET  Schedule for CT adb/pelvis this week- no antibiotics until Ct reviewed,   Javae Braaten S Nejla Reasor PA-C 11/25/2014   Cc: No ref. provider found  Addendum: Reviewed and agree with initial management. [name], MD

## 2014-11-25 NOTE — Patient Instructions (Addendum)
Please go to the basement level to have your labs drawn.  Start Miralax 17 grams in 8 oz of water. You have been scheduled for a CT scan of the abdomen and pelvis at [place] ([address]---this is in the same building as Press photographer).   You are scheduled on 11-28-2014 at 9:30 am . You should arrive at 9:15 minutes prior to your appointment time for registration. Please follow the written instructions below on the day of your exam:  WARNING: IF YOU ARE ALLERGIC TO IODINE/X-RAY DYE, PLEASE NOTIFY RADIOLOGY IMMEDIATELY AT [phone]! YOU WILL BE GIVEN A 13 HOUR PREMEDICATION PREP.  1) Do not eat or drink anything after  5:30 am  (4 hours prior to your test) 2) You have been given 2 bottles of oral contrast to drink. The solution may taste better if refrigerated, but do NOT add ice or any other liquid to this solution. Shake  well before drinking.    Drink 1 bottle of contrast @ 7:30 am  (2 hours prior to your exam)  Drink 1 bottle of contrast @ 8:30 am  (1 hour prior to your exam)  You may take any medications as prescribed with a small amount of water except for the following: Metformin, Glucophage, Glucovance, Avandamet, Riomet, Fortamet, Actoplus Met, Janumet, Glumetza or Metaglip. The above medications must be held the day of the exam AND 48 hours after the exam.  The purpose of you drinking the oral contrast is to aid in the visualization of your intestinal tract. The contrast solution may cause some diarrhea. Before your exam is started, you will be given a small amount of fluid to drink. Depending on your individual set of symptoms, you may also receive an intravenous injection of x-ray contrast/dye. Plan on being at [facility] for 30 minutes or long, depending on the type of exam you are having performed.  This test typically takes 30-45 minutes to complete.  If you have any questions regarding your exam or if you need to reschedule, you may call the CT  department at [phone] between the hours of 8:00 am and 5:00 pm, Monday-Friday.  ________________________________________________________________________

## 2014-11-28 ENCOUNTER — Ambulatory Visit (INDEPENDENT_AMBULATORY_CARE_PROVIDER_SITE_OTHER)
Admission: RE | Admit: 2014-11-28 | Discharge: 2014-11-28 | Disposition: A | Discharge status: Home / Self Care | Payer: Medicare PPO | Source: Ambulatory Visit | Attending: Physician Assistant | Admitting: Physician Assistant

## 2014-11-28 DIAGNOSIS — R1032 Left lower quadrant pain: Secondary | ICD-10-CM

## 2014-11-28 DIAGNOSIS — R1031 Right lower quadrant pain: Secondary | ICD-10-CM | POA: Diagnosis not present

## 2014-11-28 DIAGNOSIS — R194 Change in bowel habit: Secondary | ICD-10-CM

## 2014-12-12 ENCOUNTER — Telehealth: Payer: Self-pay | Admitting: Internal Medicine

## 2014-12-12 NOTE — Telephone Encounter (Signed)
Dr. Kendrick FriesMcQuaid, ok to switch?

## 2014-12-12 NOTE — Telephone Encounter (Signed)
Fine with me to switch 

## 2014-12-12 NOTE — Telephone Encounter (Signed)
Per 8/2/1/6 OV w/ Dr. Kendrick FriesMcQuaid: Patient Instructions       [facility], we will change the prescription for you with our office We will order a test to measure your oxygen while you sleep.    Come back to see Dr. Maple HudsonYoung as previously scheduled.  --  Pt is requesting to stay with Dr. Kendrick FriesMcQuaid.  Please advise Dr. Maple HudsonYoung and Dr. Kendrick FriesMcQuaid thanks

## 2014-12-15 NOTE — Telephone Encounter (Signed)
Pt returned call, sched her w/[facility] next avail 2/7@2 :15.Stanley A Dalton

## 2014-12-15 NOTE — Telephone Encounter (Signed)
lmtcb X1 to schedule pt with next available appt with BQ.

## 2014-12-15 NOTE — Telephone Encounter (Signed)
OK by me 

## 2014-12-19 LAB — CUP PACEART REMOTE DEVICE CHECK: MDC IDC SESS DTM: 20161027203549

## 2014-12-19 NOTE — Progress Notes (Signed)
Carelink summary report received. Battery status OK. Normal device function. No tachy, brady, pause, or AF episodes. 1 symptom episode--from demonstration/teaching of symptom activator use during JA appointment on 10/27/14. Monthly summary reports and ROV with JA on 01/28/15 at 10:45am.

## 2014-12-22 ENCOUNTER — Ambulatory Visit (INDEPENDENT_AMBULATORY_CARE_PROVIDER_SITE_OTHER): Payer: Medicare PPO | Admitting: *Deleted

## 2014-12-22 DIAGNOSIS — R002 Palpitations: Secondary | ICD-10-CM

## 2014-12-24 NOTE — Progress Notes (Signed)
LOOP RECORDER  

## 2015-01-20 ENCOUNTER — Ambulatory Visit (INDEPENDENT_AMBULATORY_CARE_PROVIDER_SITE_OTHER): Payer: Medicare PPO | Admitting: *Deleted

## 2015-01-20 DIAGNOSIS — R002 Palpitations: Secondary | ICD-10-CM | POA: Diagnosis not present

## 2015-01-20 NOTE — Progress Notes (Signed)
Carelink Summary Report / Loop Recorder 

## 2015-01-28 ENCOUNTER — Encounter: Payer: Self-pay | Admitting: Internal Medicine

## 2015-01-28 ENCOUNTER — Ambulatory Visit (INDEPENDENT_AMBULATORY_CARE_PROVIDER_SITE_OTHER): Payer: Medicare PPO | Admitting: Internal Medicine

## 2015-01-28 ENCOUNTER — Other Ambulatory Visit: Payer: Self-pay

## 2015-01-28 VITALS — BP 112/78 | HR 70 | Ht 63.0 in | Wt 181.6 lb

## 2015-01-28 DIAGNOSIS — R002 Palpitations: Secondary | ICD-10-CM | POA: Diagnosis not present

## 2015-01-28 DIAGNOSIS — I10 Essential (primary) hypertension: Secondary | ICD-10-CM | POA: Diagnosis not present

## 2015-01-28 DIAGNOSIS — I480 Paroxysmal atrial fibrillation: Secondary | ICD-10-CM

## 2015-01-28 LAB — CUP PACEART INCLINIC DEVICE CHECK: Date Time Interrogation Session: 20170104112820

## 2015-01-28 MED ORDER — FUROSEMIDE 20 MG PO TABS: 20.0000 mg | ORAL_TABLET | Freq: Every day | ORAL | Status: DC | PRN

## 2015-01-28 NOTE — Patient Instructions (Signed)
Medication Instructions:  Your physician recommends that you continue on your current medications as directed. Please refer to the Current Medication list given to you today.   Labwork: None ordered   Testing/Procedures: None ordered   Follow-Up: Your physician wants you to follow-up in: 6 months with Amber Seiler, NP You will receive a reminder letter in the mail two months in advance. If you don't receive a letter, please call our office to schedule the follow-up appointment.   Any Other Special Instructions Will Be Listed Below (If Applicable).     If you need a refill on your cardiac medications before your next appointment, please call your pharmacy.   

## 2015-01-28 NOTE — Progress Notes (Signed)
Electrophysiology Office Note   Date:  01/28/2015   ID:  [place]ari [name], DOB 30-May-1928, MRN [id number]  PCP:  PROVIDER NOT IN SYSTEM   Primary Electrophysiologist: Hillis RangeJames Margrit Minner, MD    Chief Complaint  Patient presents with  . Palpitations  . PAF     History of Present Illness: Kari [name] is a 82 y.o. female who presents today for electrophysiology evaluation.   She is doing well.  NO arrhythmias on her ILR.  Lung disease/ SOB are stable and followed by pulmonary.  Today, she denies symptoms of  chest pain,  orthopnea, PND, lower extremity edema, claudication, dizziness, presyncope, syncope, bleeding, or neurologic sequela. The patient is tolerating medications without difficulties and is otherwise without complaint today.    Past Medical History  Diagnosis Date  . COPD (chronic obstructive pulmonary disease) (HCC)   . Thyroid disease   . Osteoporosis   . Sarcoid (HCC)     pulmonary and dermatologic  . Angina at rest [facility](HCC)   . MI, old   . Palpitations   . Abdominal pain   . Sarcoidosis, lung (HCC)   . Atrial fibrillation (HCC)   . Arthritis   . Asthma   . History of gallstones   . History of scarlet fever   . Premature atrial contractions   . Helicobacter pylori gastritis 11/2011  . GERD (gastroesophageal reflux disease)   . Hiatal hernia   . Zenker's diverticulum    Past Surgical History  Procedure Laterality Date  . Thyroidectomy    . Cataract extraction Bilateral   . Colonoscopy    . Cardiolite  2000  . Cholecystectomy  04/1996  . Abdominal hysterectomy  10/1997  . Hernia repair      inguinal and hiatal/abdominal  . Leg surgery      right leg...rod/screws  . Ep implantable device N/A 07/23/2014    Procedure: Loop Recorder Insertion;  Surgeon: Hillis RangeJames Nasri Boakye, MD;  Location: [facility];  Service: Cardiovascular;  Laterality: N/A;     Current Outpatient Prescriptions  Medication Sig Dispense Refill  . albuterol (PROVENTIL HFA;VENTOLIN HFA) 108  (90 BASE) MCG/ACT inhaler Inhale 2 puffs into the lungs every 6 (six) hours as needed for wheezing or shortness of breath.    Marland Kitchen. azithromycin (ZITHROMAX) 250 MG tablet Take 1 tablet by mouth daily.    . Calcium Carbonate-Vitamin D (CALTRATE 600+D) 600-400 MG-UNIT per tablet Take 1 tablet by mouth daily.    . Cholecalciferol 2000 UNITS TABS Take 2,000 Units by mouth daily.    . furosemide (LASIX) 20 MG tablet Take 1 tablet (20 mg total) by mouth daily as needed (Swelling). 30 tablet 6  . hydrochlorothiazide (HYDRODIURIL) 25 MG tablet Take 25 mg by mouth daily.    Marland Kitchen. levothyroxine (SYNTHROID, LEVOTHROID) 112 MCG tablet Take 112 mcg by mouth daily before breakfast.    . metoprolol succinate (TOPROL-XL) 25 MG 24 hr tablet Take 25 mg by mouth daily.    . multivitamin (THERAGRAN) per tablet Take 1 tablet by mouth daily.    . potassium chloride (K-DUR) 10 MEQ tablet Take 10 mEq by mouth daily.    . ranitidine (ZANTAC) 300 MG tablet Take 1 tablet by mouth at bedtime.    . sucralfate (CARAFATE) 1 G tablet Take 1 g by mouth as directed.     No current facility-administered medications for this visit.    Allergies:   Advair diskus; Aspirin; Dulera; Ivp dye; Losartan potassium; Amitriptyline; Amlodipine besylate; Azithromycin; Codeine; Morphine and  related; Other; Penicillins; Prevacid ; Symbicort; and Tramadol   Social History:  The patient  reports that she quit smoking about 47 years ago. Her smoking use included Cigarettes and Pipe. She has a 20 pack-year smoking history. She has never used smokeless tobacco. She reports that she drinks alcohol. She reports that she does not use illicit drugs.   Family History:  The patient's  family history includes Diabetes in her brother, daughter, and father; Heart disease in her mother; Other in her sister; Rheumatic fever in her sister. There is no history of Colon cancer.    ROS:  Please see the history of present illness.   All other systems are reviewed and  negative.    PHYSICAL EXAM: VS:  BP 112/78 mmHg  Pulse 70  Ht 5\' 3"  (1.6 m)  Wt 181 lb 9.6 oz (82.373 kg)  BMI 32.18 kg/m2 , BMI Body mass index is 32.18 kg/(m^2). GEN: Well nourished, well developed, in no acute distress HEENT: normal Neck: no JVD, carotid bruits, or masses Cardiac: RRR; no murmurs, rubs, or gallops,+1 edema  Respiratory:  clear to auscultation bilaterally, normal work of breathing GI: soft, nontender, nondistended, + BS MS: no deformity or atrophy Skin: warm and dry  Neuro:  Strength and sensation are intact Psych: euthymic mood, full affect  ILR monitor reveals no arrhythmias   Recent Labs: 11/25/2014: BUN 20; Creatinine, Ser 0.87; Hemoglobin 12.8; Platelets 239.0; Potassium 4.1; Sodium 134*    Lipid Panel     Component Value Date/Time   TRIG 62 02/28/2013 0132     Wt Readings from Last 3 Encounters:  01/28/15 181 lb 9.6 oz (82.373 kg)  11/25/14 178 lb 8 oz (80.967 kg)  10/27/14 176 lb 6.4 oz (80.015 kg)     ASSESSMENT AND PLAN:  1.  Palpitations Resolved ILR reviewed and normal  2. afib None documented on ILR  3. Pulmonary hypertension Likely due to chronic lung disease Followed by Dr Kendrick Fries  4. HTN, edema Sodium restriction advised She has venous insufficiency but does not want to wear support hose   Current medicines are reviewed at length with the patient today.   The patient does not have concerns regarding her medicines.  The following changes were made today:  none  Labs/ tests ordered today include:  No orders of the defined types were placed in this encounter.   Return to see EP NP in 6 months Follow carelink If doing well at 6 months, could probably be seen annually at that point.  Randolm Idol, MD  01/28/2015 11:18 AM     [facility] [address] [place] [address] [phone] (office) [phone] (fax)

## 2015-02-02 LAB — CUP PACEART REMOTE DEVICE CHECK: Date Time Interrogation Session: 20161126203533

## 2015-02-18 ENCOUNTER — Ambulatory Visit (INDEPENDENT_AMBULATORY_CARE_PROVIDER_SITE_OTHER): Payer: Medicare PPO | Admitting: *Deleted

## 2015-02-18 DIAGNOSIS — R002 Palpitations: Secondary | ICD-10-CM | POA: Diagnosis not present

## 2015-02-19 NOTE — Progress Notes (Signed)
Carelink Summary Report / Loop Recorder 

## 2015-02-28 LAB — CUP PACEART REMOTE DEVICE CHECK: Date Time Interrogation Session: 20161226203525

## 2015-03-03 ENCOUNTER — Ambulatory Visit: Payer: Medicare PPO | Admitting: Pulmonary Disease

## 2015-03-20 ENCOUNTER — Ambulatory Visit (INDEPENDENT_AMBULATORY_CARE_PROVIDER_SITE_OTHER): Payer: Medicare PPO | Admitting: *Deleted

## 2015-03-20 DIAGNOSIS — R002 Palpitations: Secondary | ICD-10-CM

## 2015-03-23 NOTE — Progress Notes (Signed)
Carelink Summary Report / Loop Recorder 

## 2015-04-11 LAB — CUP PACEART REMOTE DEVICE CHECK: Date Time Interrogation Session: 20170125210729

## 2015-04-11 NOTE — Progress Notes (Signed)
Carelink summary report received. Battery status OK. Normal device function. No new tachy episodes, brady, or pause episodes. No new AF episodes. 1 new symptom episode-ECG appears reg. VS with PAC. Monthly summary reports and ROV/PRN
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
## 2015-04-13 ENCOUNTER — Encounter: Payer: Self-pay | Admitting: Physician Assistant

## 2015-04-13 ENCOUNTER — Ambulatory Visit (INDEPENDENT_AMBULATORY_CARE_PROVIDER_SITE_OTHER): Payer: Medicare PPO | Admitting: Physician Assistant

## 2015-04-13 VITALS — BP 150/62 | HR 66 | Ht 64.0 in | Wt 183.1 lb

## 2015-04-13 DIAGNOSIS — I10 Essential (primary) hypertension: Secondary | ICD-10-CM

## 2015-04-13 DIAGNOSIS — J449 Chronic obstructive pulmonary disease, unspecified: Secondary | ICD-10-CM

## 2015-04-13 DIAGNOSIS — R600 Localized edema: Secondary | ICD-10-CM | POA: Diagnosis not present

## 2015-04-13 DIAGNOSIS — R002 Palpitations: Secondary | ICD-10-CM

## 2015-04-13 DIAGNOSIS — R0602 Shortness of breath: Secondary | ICD-10-CM | POA: Diagnosis not present

## 2015-04-13 LAB — CBC WITH DIFFERENTIAL/PLATELET
Basophils Absolute: 0 10*3/uL (ref 0.0–0.1)
Basophils Relative: 1 % (ref 0–1)
EOS PCT: 2 % (ref 0–5)
Eosinophils Absolute: 0.1 10*3/uL (ref 0.0–0.7)
HCT: 39 % (ref 36.0–46.0)
Hemoglobin: 12.8 g/dL (ref 12.0–15.0)
Lymphocytes Relative: 25 % (ref 12–46)
Lymphs Abs: 1.2 10*3/uL (ref 0.7–4.0)
MCH: 30.8 pg (ref 26.0–34.0)
MCHC: 32.8 g/dL (ref 30.0–36.0)
MCV: 93.8 fL (ref 78.0–100.0)
MONO ABS: 0.6 10*3/uL (ref 0.1–1.0)
MPV: 9.3 fL (ref 8.6–12.4)
Monocytes Relative: 13 % — ABNORMAL HIGH (ref 3–12)
Neutro Abs: 2.8 10*3/uL (ref 1.7–7.7)
Neutrophils Relative %: 59 % (ref 43–77)
Platelets: 245 10*3/uL (ref 150–400)
RBC: 4.16 MIL/uL (ref 3.87–5.11)
RDW: 13 % (ref 11.5–15.5)
WBC: 4.7 10*3/uL (ref 4.0–10.5)

## 2015-04-13 LAB — BASIC METABOLIC PANEL
BUN: 18 mg/dL (ref 7–25)
CO2: 28 mmol/L (ref 20–31)
Calcium: 9 mg/dL (ref 8.6–10.4)
Chloride: 96 mmol/L — ABNORMAL LOW (ref 98–110)
Creat: 0.88 mg/dL (ref 0.60–0.88)
GLUCOSE: 95 mg/dL (ref 65–99)
POTASSIUM: 4.8 mmol/L (ref 3.5–5.3)
Sodium: 132 mmol/L — ABNORMAL LOW (ref 135–146)

## 2015-04-13 LAB — TSH: TSH: 2.79 m[IU]/L

## 2015-04-13 NOTE — Patient Instructions (Addendum)
Medication Instructions:  1. DO NOT TAKE ENTRESTO 2. INCREASE LASIX TO 40 MG DAILY FOR 1 WEEK THEN GO BACK TO YOUR REGULAR DOSE OF 20 MG DAILY 3. INCREASE POTASSIUM TO 20 MEQ  DAILY FOR 1 WEEK THEN GO BACK TO YOUR REGULAR DOSE OF POTASSIUM 10 MEQ DAILY  Labwork: 1. TODAY BMET, CBC W/DIFF, TSH, BNP 2. IN 1 WEEK BMET  Testing/Procedures: Your physician has requested that you have an echocardiogram. Echocardiography is a painless test that uses sound waves to create images of your heart. It provides your doctor with information about the size and shape of your heart and how well your heart's chambers and valves are working. This procedure takes approximately one hour. There are no restrictions for this procedure.  Follow-Up: Kari [name], PAC 2-3 WEEKS   Any Other Special Instructions Will Be Listed Below (If Applicable).  If you need a refill on your cardiac medications before your next appointment, please call your pharmacy.

## 2015-04-14 ENCOUNTER — Telehealth: Payer: Self-pay | Admitting: *Deleted

## 2015-04-14 LAB — BRAIN NATRIURETIC PEPTIDE: Brain Natriuretic Peptide: 98.2 pg/mL (ref <100)

## 2015-04-14 NOTE — Telephone Encounter (Signed)
Pt notified of lab results and pof recommendation to stay on increased dose lasix and K+ for 5 days , then resume regular doses of lasix and K+. Pt agreeable to plan of care and said thank you.

## 2015-04-17 ENCOUNTER — Encounter: Payer: Self-pay | Admitting: Internal Medicine

## 2015-04-17 ENCOUNTER — Telehealth: Payer: Self-pay | Admitting: Cardiology

## 2015-04-17 NOTE — Telephone Encounter (Signed)
Spoke w/ pt and requested that she send a manual transmission. Pt verbalized understanding.

## 2015-04-20 ENCOUNTER — Telehealth: Payer: Self-pay | Admitting: *Deleted

## 2015-04-20 ENCOUNTER — Other Ambulatory Visit (INDEPENDENT_AMBULATORY_CARE_PROVIDER_SITE_OTHER): Payer: Medicare PPO

## 2015-04-20 ENCOUNTER — Ambulatory Visit (INDEPENDENT_AMBULATORY_CARE_PROVIDER_SITE_OTHER): Payer: Medicare PPO | Admitting: *Deleted

## 2015-04-20 ENCOUNTER — Encounter: Payer: Self-pay | Admitting: Physician Assistant

## 2015-04-20 ENCOUNTER — Ambulatory Visit (HOSPITAL_COMMUNITY)
Admission: RE | Admit: 2015-04-20 | Discharge: 2015-04-20 | Disposition: A | Discharge status: Home / Self Care | Payer: Medicare PPO | Source: Ambulatory Visit | Attending: Physician Assistant | Admitting: Physician Assistant

## 2015-04-20 DIAGNOSIS — I119 Hypertensive heart disease without heart failure: Secondary | ICD-10-CM | POA: Insufficient documentation

## 2015-04-20 DIAGNOSIS — J449 Chronic obstructive pulmonary disease, unspecified: Secondary | ICD-10-CM | POA: Insufficient documentation

## 2015-04-20 DIAGNOSIS — R002 Palpitations: Secondary | ICD-10-CM | POA: Insufficient documentation

## 2015-04-20 DIAGNOSIS — I10 Essential (primary) hypertension: Secondary | ICD-10-CM

## 2015-04-20 DIAGNOSIS — I352 Nonrheumatic aortic (valve) stenosis with insufficiency: Secondary | ICD-10-CM | POA: Diagnosis not present

## 2015-04-20 DIAGNOSIS — Z87891 Personal history of nicotine dependence: Secondary | ICD-10-CM | POA: Diagnosis not present

## 2015-04-20 DIAGNOSIS — I340 Nonrheumatic mitral (valve) insufficiency: Secondary | ICD-10-CM | POA: Insufficient documentation

## 2015-04-20 DIAGNOSIS — R0602 Shortness of breath: Secondary | ICD-10-CM

## 2015-04-20 DIAGNOSIS — R600 Localized edema: Secondary | ICD-10-CM

## 2015-04-20 LAB — BASIC METABOLIC PANEL
BUN: 23 mg/dL (ref 7–25)
CHLORIDE: 95 mmol/L — AB (ref 98–110)
CO2: 30 mmol/L (ref 20–31)
Calcium: 9.8 mg/dL (ref 8.6–10.4)
Creat: 1.02 mg/dL — ABNORMAL HIGH (ref 0.60–0.88)
GLUCOSE: 91 mg/dL (ref 65–99)
POTASSIUM: 5 mmol/L (ref 3.5–5.3)
SODIUM: 133 mmol/L — AB (ref 135–146)

## 2015-04-20 NOTE — Telephone Encounter (Signed)
Pt notified of echo results and findings by phone with verbal understanding. 

## 2015-04-20 NOTE — Telephone Encounter (Signed)
Bmet ordered for 04/27/15 by Bing NeighborsScott W. PA

## 2015-04-20 NOTE — Progress Notes (Signed)
Carelink Summary Report / Loop Recorder 

## 2015-04-20 NOTE — Telephone Encounter (Signed)
Pt states took lasix for 4 days of the 5 days that PA advised to her on 3/21. I advised pt to only take lasix/K+ at this time only if wt up 3 lb x 1 day. Pt agreeable to plan of care, bmet 4/3

## 2015-04-20 NOTE — Addendum Note (Signed)
Addended by: Tarri FullerFIATO, CAROL M on: 04/20/2015 06:03 PM   Modules accepted: Orders

## 2015-04-27 ENCOUNTER — Other Ambulatory Visit (INDEPENDENT_AMBULATORY_CARE_PROVIDER_SITE_OTHER): Payer: Medicare PPO | Admitting: *Deleted

## 2015-04-27 ENCOUNTER — Telehealth: Payer: Self-pay | Admitting: *Deleted

## 2015-04-27 DIAGNOSIS — I10 Essential (primary) hypertension: Secondary | ICD-10-CM

## 2015-04-27 LAB — BASIC METABOLIC PANEL
BUN: 20 mg/dL (ref 7–25)
CO2: 28 mmol/L (ref 20–31)
Calcium: 9.1 mg/dL (ref 8.6–10.4)
Chloride: 100 mmol/L (ref 98–110)
Creat: 0.81 mg/dL (ref 0.60–0.88)
GLUCOSE: 94 mg/dL (ref 65–99)
POTASSIUM: 4.7 mmol/L (ref 3.5–5.3)
SODIUM: 137 mmol/L (ref 135–146)

## 2015-04-27 NOTE — Telephone Encounter (Signed)
Pt notified of lab results by phone with verbal understanding.  

## 2015-05-07 NOTE — Progress Notes (Signed)
Cardiology Office Note:    Date:  05/08/2015   ID:  Kari [name], DOB 1928-06-28, MRN [id number]  PCP:  PROVIDER NOT IN SYSTEM  Cardiologist:  Dr. Charlton HawsPeter [name]   Electrophysiologist:  Dr. Hillis RangeJames [name]   Chief Complaint  Patient presents with  . Shortness of Breath    follow up    History of Present Illness:     Kari [name] is a 82 y.o. female, retired Optician, dispensingminister, with a hx of thyroidectomy, sarcoidosis, COPD, pulmonary hypertension, remote PAF. She had a normal Cardiolite study in 2000. She has reported palpitations in the past. Event monitors have demonstrated sinus rhythm and PACs. She ultimately underwent implantation of an ILR. Last seen by Kari [name] 1/17. ILR to date had not demonstrated any significant arrhythmias. Recent notes in the chart from EP clinic demonstrates on 04/11/15, she had no atrial fibrillation episodes. She had one episode recorded with PACs.  I saw her 3/20 with c/o edema and dyspnea.  I increased her Lasix.  BNP was normal. FU echo demonstrated normal EF. She returns for FU. She returns for FU.  She is overall stable. She has some worsening edema, abdominal bloating and dyspnea at times.  This seems to be related to high salt load in her diet.  She denies chest pain, syncope, PND.  BP at home has been running high.    Past Medical History  Diagnosis Date  . COPD (chronic obstructive pulmonary disease) (HCC)   . Thyroid disease   . Osteoporosis   . Sarcoid (HCC)     pulmonary and dermatologic  . Angina at rest [facility](HCC)   . MI, old   . Palpitations   . Abdominal pain   . Sarcoidosis, lung (HCC)   . Atrial fibrillation (HCC)   . Arthritis   . Asthma   . History of gallstones   . History of scarlet fever   . Premature atrial contractions   . Helicobacter pylori gastritis 11/2011  . GERD (gastroesophageal reflux disease)   . Hiatal hernia   . Zenker's diverticulum   . History of echocardiogram     Echo 3/17: Mild LVH, EF 55-60%, mild aortic stenosis  (peak 10 mmHg), mild MR, mild LAE    Past Surgical History  Procedure Laterality Date  . Thyroidectomy    . Cataract extraction Bilateral   . Colonoscopy    . Cardiolite  2000  . Cholecystectomy  04/1996  . Abdominal hysterectomy  10/1997  . Hernia repair      inguinal and hiatal/abdominal  . Leg surgery      right leg...rod/screws  . Ep implantable device N/A 07/23/2014    Procedure: Loop Recorder Insertion;  Surgeon: Hillis RangeJames Allred, MD;  Location: [facility];  Service: Cardiovascular;  Laterality: N/A;    Current Medications: Outpatient Prescriptions Prior to Visit  Medication Sig Dispense Refill  . albuterol (PROVENTIL HFA;VENTOLIN HFA) 108 (90 BASE) MCG/ACT inhaler Inhale 1 puff into the lungs every 6 (six) hours as needed for wheezing or shortness of breath.     . Calcium Carbonate-Vitamin D (CALTRATE 600+D) 600-400 MG-UNIT per tablet Take 1 tablet by mouth daily.    . Cholecalciferol 2000 UNITS TABS Take 2,000 Units by mouth daily.    . clonazePAM (KLONOPIN) 0.5 MG tablet Take 0.5 mg by mouth daily as needed for anxiety.   0  . levothyroxine (SYNTHROID, LEVOTHROID) 112 MCG tablet Take 112 mcg by mouth daily before breakfast.    . multivitamin (THERAGRAN) per tablet  Take 1 tablet by mouth daily.    . potassium chloride (K-DUR) 10 MEQ tablet Take 10 mEq by mouth daily.    . ranitidine (ZANTAC) 300 MG tablet Take 1 tablet by mouth at bedtime.    . metoprolol succinate (TOPROL-XL) 25 MG 24 hr tablet Take 25 mg by mouth daily.    . furosemide (LASIX) 20 MG tablet Take 1 tablet (20 mg total) by mouth daily as needed (Swelling). (Patient not taking: Reported on 05/08/2015) 30 tablet 6   No facility-administered medications prior to visit.     Allergies:   Advair diskus; Aspirin; Dulera; Ivp dye; Losartan potassium; Amitriptyline; Amlodipine besylate; Azithromycin; Codeine; Morphine and related; Other; Penicillins; Prevacid ; Symbicort; and Tramadol   Social History   Social  History  . Marital Status: Divorced    Spouse Name: N/A  . Number of Children: 4  . Years of Education: N/A   Occupational History  . clergy    Social History Main Topics  . Smoking status: Former Smoker -- 2.00 packs/day for 10 years    Types: Cigarettes, Pipe    Quit date: 01/25/1968  . Smokeless tobacco: Never Used     Comment: pt unsure of exact month in 1977  . Alcohol Use: 0.0 oz/week    0 Standard drinks or equivalent per week     Comment: seldom...glass wine  . Drug Use: No  . Sexual Activity: Not Asked   Other Topics Concern  . None   Social History Narrative   ** Merged History Encounter **         Family History:  The patient's family history includes Diabetes in her brother, daughter, and father; Heart disease in her mother; Other in her sister; Rheumatic fever in her sister. There is no history of Colon cancer.   ROS:   Please see the history of present illness.    ROSAll other systems reviewed and are negative.   Physical Exam:    VS:  BP 160/80 mmHg  Pulse 84  Ht  (1.626 m)  Wt 182 lb 12.8 oz (82.918 kg)  BMI 31.36 kg/m2   GEN: Well nourished, well developed, in no acute distress HEENT: normal Neck: no JVD, no masses Cardiac: Normal S1/S2, RRR; no murmurs, rubs, or gallops, mainly non-pitting bilat LE edema;   + varicosities  Respiratory:  clear to auscultation bilaterally; no wheezing, rhonchi or rales GI: soft, nontender, nondistended MS: no deformity or atrophy Skin: warm and dry Neuro: No focal deficits  Psych: Alert and oriented x 3, normal affect  Wt Readings from Last 3 Encounters:  05/08/15 182 lb 12.8 oz (82.918 kg)  04/13/15 183 lb 1.9 oz (83.063 kg)  01/28/15 181 lb 9.6 oz (82.373 kg)      Studies/Labs Reviewed:     EKG:  EKG is  ordered today.  The ekg ordered today demonstrates NSR, HR 84, LAD, QTc 449 ms, no changes   Recent Labs: 04/13/2015: Hemoglobin 12.8; Platelets 245; TSH 2.79 04/27/2015: BUN 20; Creat 0.81;  Potassium 4.7; Sodium 137   Recent Lipid Panel    Component Value Date/Time   TRIG 62 02/28/2013 0132    Additional studies/ records that were reviewed today include:   Echo 04/20/15 Mild LVH, EF 55-60%, mild aortic stenosis (peak 10 mmHg), mild AI, mild MR, mild LAE  Echo 2/16 (at [facility] [place] [facility]) EF 60-65%, impaired LV filling, no RWMA, normal RVSF, mod Pul HTN, RVSP 50-55 mmHg, mild to mod LAE,  mild AI  ASSESSMENT:     1. Chronic diastolic CHF (congestive heart failure) (HCC)   2. Bilateral edema of lower extremity   3. Palpitations   4. Chronic obstructive pulmonary disease, unspecified COPD type (HCC)   5. Essential hypertension   6. Aortic stenosis     PLAN:     In order of problems listed above:  1. Chronic Diastolic CHF -  Recent echo with normal LVEF.  Volume stable. She had increasing creatinine on higher dose Lasix.  We discussed the importance of limiting salt and not drinking excessive amounts of fluid.  She can take an extra Lasix 20 mg on days she feels more bloated or has increased edema/dyspnea.    2. Edema - She has some chronic edema/lymphedema as well.  I have asked her to try compression stockings   3. Palpitations - s/p ILR.     4. COPD - Hx of pulmonary HTN on echo.  No sig pulmo HTN on recent echo.  Followed by Pulmonology.    5. HTN - BP elevated.  It has run high lately.  Increase Toprol-XL to 50 mg QD.  Monitor BP.  Call if SBP > 150.  6. Aortic stenosis - Mild by recent echo.  Consider repeat Echo in 2 years or sooner if symptoms dictate.    Medication Adjustments/Labs and Tests Ordered: Current medicines are reviewed at length with the patient today.  Concerns regarding medicines are outlined above.  Medication changes, Labs and Tests ordered today are outlined in the Patient Instructions noted below. Patient Instructions  Medication Instructions:  Increase Toprol-XL (Metoprolol Succinate) to 50 mg Once daily  Labwork: None  today Testing/Procedures: None  Follow-Up: Gypsy Balsam, NP as scheduled in July 2017 Any Other Special Instructions Will Be Listed Below (If Applicable). Wear compression stockings daily if you can tolerate them to help with swelling. It is ok to take an extra Lasix 20 mg on days you have increased abdominal bloating or swelling. Check BP daily.  Call if your systolic BP (top number) is always over 150. If you need a refill on your cardiac medications before your next appointment, please call your pharmacy.    Signed, Tereso Newcomer, PA-C  05/08/2015 12:40 PM    [facility] [address] [place], [place], [place]  [address] Phone: [phone]; Fax: [phone]

## 2015-05-08 ENCOUNTER — Ambulatory Visit (INDEPENDENT_AMBULATORY_CARE_PROVIDER_SITE_OTHER): Payer: Medicare PPO | Admitting: Physician Assistant

## 2015-05-08 ENCOUNTER — Encounter: Payer: Self-pay | Admitting: Physician Assistant

## 2015-05-08 VITALS — BP 160/80 | HR 84 | Ht 64.0 in | Wt 182.8 lb

## 2015-05-08 DIAGNOSIS — R0602 Shortness of breath: Secondary | ICD-10-CM

## 2015-05-08 DIAGNOSIS — R002 Palpitations: Secondary | ICD-10-CM | POA: Diagnosis not present

## 2015-05-08 DIAGNOSIS — J449 Chronic obstructive pulmonary disease, unspecified: Secondary | ICD-10-CM | POA: Diagnosis not present

## 2015-05-08 DIAGNOSIS — I5032 Chronic diastolic (congestive) heart failure: Secondary | ICD-10-CM | POA: Diagnosis not present

## 2015-05-08 DIAGNOSIS — I350 Nonrheumatic aortic (valve) stenosis: Secondary | ICD-10-CM

## 2015-05-08 DIAGNOSIS — R600 Localized edema: Secondary | ICD-10-CM

## 2015-05-08 DIAGNOSIS — I10 Essential (primary) hypertension: Secondary | ICD-10-CM

## 2015-05-08 MED ORDER — METOPROLOL SUCCINATE ER 50 MG PO TB24: 50.0000 mg | ORAL_TABLET | Freq: Every day | ORAL | Status: DC

## 2015-05-08 NOTE — Patient Instructions (Addendum)
Medication Instructions:  Increase Toprol-XL (Metoprolol Succinate) to 50 mg Once daily  Labwork: None today Testing/Procedures: None  Follow-Up: Gypsy BalsamAmber Seiler, NP as scheduled in July 2017 Any Other Special Instructions Will Be Listed Below (If Applicable). Wear compression stockings daily if you can tolerate them to help with swelling. It is ok to take an extra Lasix 20 mg on days you have increased abdominal bloating or swelling. Check BP daily.  Call if your systolic BP (top number) is always over 150. If you need a refill on your cardiac medications before your next appointment, please call your pharmacy.

## 2015-05-14 ENCOUNTER — Ambulatory Visit (INDEPENDENT_AMBULATORY_CARE_PROVIDER_SITE_OTHER): Payer: Medicare PPO | Admitting: Pulmonary Disease

## 2015-05-14 ENCOUNTER — Encounter: Payer: Self-pay | Admitting: Pulmonary Disease

## 2015-05-14 VITALS — BP 138/76 | HR 88 | Ht 63.0 in | Wt 180.0 lb

## 2015-05-14 DIAGNOSIS — J449 Chronic obstructive pulmonary disease, unspecified: Secondary | ICD-10-CM | POA: Diagnosis not present

## 2015-05-14 DIAGNOSIS — J45909 Unspecified asthma, uncomplicated: Secondary | ICD-10-CM

## 2015-05-14 DIAGNOSIS — J309 Allergic rhinitis, unspecified: Secondary | ICD-10-CM

## 2015-05-14 DIAGNOSIS — D860 Sarcoidosis of lung: Secondary | ICD-10-CM | POA: Diagnosis not present

## 2015-05-14 MED ORDER — BUDESONIDE 0.5 MG/2ML IN SUSP: 0.5000 mg | Freq: Two times a day (BID) | RESPIRATORY_TRACT | Status: DC

## 2015-05-14 MED ORDER — ARFORMOTEROL TARTRATE 15 MCG/2ML IN NEBU: 15.0000 ug | INHALATION_SOLUTION | Freq: Two times a day (BID) | RESPIRATORY_TRACT | Status: DC

## 2015-05-14 NOTE — Assessment & Plan Note (Signed)
She has not had any exacerbations but her symptoms have been quite severe mostly because she has been intolerant of her controller medications.  Flu shot are all up to date, but she has only had the pneumovax.  Plan: We will try switching her controller mediations to Brovana and Pulmicort Stop Breo Continue PRN albuterol Prevnar vaccine today F/u 6 months

## 2015-05-14 NOTE — Assessment & Plan Note (Signed)
Dr. Maple HudsonYoung felt that she had non-allergic rhinitis.  It seems like this has been stable interval for her.
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
## 2015-05-14 NOTE — Assessment & Plan Note (Signed)
Stable interval without evidence of recurrence

## 2015-05-14 NOTE — Patient Instructions (Signed)
Stop taking Breo Start taking Brovana and Pulmicort nebulized twice a day Follow-up 3 months or sooner if needed

## 2015-05-18 ENCOUNTER — Telehealth: Payer: Self-pay | Admitting: Pulmonary Disease

## 2015-05-18 DIAGNOSIS — J449 Chronic obstructive pulmonary disease, unspecified: Secondary | ICD-10-CM

## 2015-05-18 NOTE — Telephone Encounter (Signed)
Called and spoke to pt. Pt requesting a new neb machine as hers broke. Order placed for new neb machine. Pt verbalized understanding and denied any further questions or concerns at this time.

## 2015-05-18 NOTE — Telephone Encounter (Signed)
lmtcb x1 for pt. 

## 2015-05-18 NOTE — Telephone Encounter (Signed)
[phone], pt cb

## 2015-05-18 NOTE — Telephone Encounter (Signed)
error 

## 2015-05-19 ENCOUNTER — Ambulatory Visit (INDEPENDENT_AMBULATORY_CARE_PROVIDER_SITE_OTHER): Payer: Medicare PPO | Admitting: *Deleted

## 2015-05-19 ENCOUNTER — Telehealth: Payer: Self-pay | Admitting: Pulmonary Disease

## 2015-05-19 DIAGNOSIS — R002 Palpitations: Secondary | ICD-10-CM

## 2015-05-19 NOTE — Telephone Encounter (Signed)
Called and spoke to rep with [employer] and was advised that they are processing the neb machine order and will be delivered to pt's home on 4.26.17. Rep was questioning what neb medication is needed, advised her the pt only needed the machine. Per pt's chart, the pt uses [facility] and not [facility]. Per the [employer] rep the pt has already paid a co-pay for the neb machine.   PCC's please advise if the order was sent to [facility] by mistake or if needed to be changed to [facility] for insurance issues. Thanks.

## 2015-05-20 NOTE — Progress Notes (Signed)
Carelink Summary Report / Loop Recorder 

## 2015-05-21 ENCOUNTER — Telehealth: Payer: Self-pay | Admitting: Pulmonary Disease

## 2015-05-21 DIAGNOSIS — J449 Chronic obstructive pulmonary disease, unspecified: Secondary | ICD-10-CM

## 2015-05-21 MED ORDER — ARFORMOTEROL TARTRATE 15 MCG/2ML IN NEBU
15.0000 ug | INHALATION_SOLUTION | Freq: Two times a day (BID) | RESPIRATORY_TRACT | Status: DC
Start: 2015-05-21 — End: 2015-08-25

## 2015-05-21 NOTE — Telephone Encounter (Signed)
Per phone note dated 05/18/15 pt had stated DME was [facility].  However, when I called the number pt provided for the DME today the rep stated it is an [facility] office.  I have corrected the Order for the pt's nebulizer machine to reflect [facility] and the Order for the neb meds was sent to this same [facility] location that pt had requested.

## 2015-05-21 NOTE — Telephone Encounter (Signed)
[facility]yn GessBrovana is needing PA (P) [phone] [facility] [employer]  Member ID# [id number]  Please advise BQ if you wish to initiate PA or change to another medication. Thanks.

## 2015-05-21 NOTE — Telephone Encounter (Signed)
We need to know if this is going through Medicare Part B If not then re-prescribe Brovana through a local DME that provides it

## 2015-05-21 NOTE — Telephone Encounter (Signed)
I called Kari [name] and was advised it can not go through medicare part B  I have printed rx [facility]  [facility] to [facility] through D[facility]

## 2015-06-05 LAB — CUP PACEART REMOTE DEVICE CHECK: Date Time Interrogation Session: 20170224213824

## 2015-06-05 NOTE — Progress Notes (Signed)
Carelink summary report received. Battery status OK. Normal device function. No new tachy episodes, brady, or pause episodes. No new AF episodes. 2 symptom episodes- 1 appears SR with PVCs, 1 appears SR. Monthly summary reports and ROV/PRN

## 2015-06-18 ENCOUNTER — Ambulatory Visit (INDEPENDENT_AMBULATORY_CARE_PROVIDER_SITE_OTHER): Payer: Medicare PPO | Admitting: *Deleted

## 2015-06-18 DIAGNOSIS — R002 Palpitations: Secondary | ICD-10-CM

## 2015-06-19 NOTE — Progress Notes (Signed)
Carelink Summary Report / Loop Recorder 

## 2015-06-20 LAB — CUP PACEART REMOTE DEVICE CHECK: Date Time Interrogation Session: 20170326220547

## 2015-06-20 NOTE — Progress Notes (Signed)
Carelink summary report received. Battery status OK. Normal device function. No new tachy episodes, brady, or pause episodes. No new AF episodes. 5 symptom- 3 with ECG's- available ECGs appear SR with occasional PVC's. Monthly summary reports and ROV/PRN

## 2015-06-22 LAB — CUP PACEART REMOTE DEVICE CHECK: MDC IDC SESS DTM: 20170425223615

## 2015-06-22 NOTE — Progress Notes (Signed)
Carelink summary report received. Battery status OK. Normal device function. No new tachy episodes, brady, or pause episodes. No new AF episodes. 3 symptom episodes, SR. Monthly summary reports and ROV/PRN

## 2015-07-03 ENCOUNTER — Telehealth: Payer: Self-pay | Admitting: Internal Medicine

## 2015-07-03 ENCOUNTER — Telehealth: Payer: Self-pay | Admitting: Cardiology

## 2015-07-03 NOTE — Telephone Encounter (Signed)
LMOVM requesting that pt send manual transmission b/c home monitor has not updated in at least 14 days.    

## 2015-07-03 NOTE — Telephone Encounter (Signed)
New Message ° °4. Are you calling to see if we received your device transmission? Yes  ° °

## 2015-07-03 NOTE — Telephone Encounter (Signed)
LMOM that we have not received her transmission and that this may be due to a technical difficulty that Carelink monitoring service is experiencing and trying to correct. Left device clinic # if she needs to call back.

## 2015-07-06 NOTE — Telephone Encounter (Signed)
Spoke w/ pt and informed her that her monitor updated today but [employer] is aware of the error code that she is getting b/c her monitor is not the only one affected and they are working on getting it fixed. Pt verbalized understanding.

## 2015-07-20 ENCOUNTER — Ambulatory Visit (INDEPENDENT_AMBULATORY_CARE_PROVIDER_SITE_OTHER): Payer: Medicare PPO | Admitting: *Deleted

## 2015-07-20 DIAGNOSIS — R002 Palpitations: Secondary | ICD-10-CM | POA: Diagnosis not present

## 2015-07-20 NOTE — Progress Notes (Signed)
Carelink Summary Report / Loop Recorder 

## 2015-07-28 LAB — CUP PACEART REMOTE DEVICE CHECK: Date Time Interrogation Session: 20170525223544

## 2015-07-28 NOTE — Progress Notes (Signed)
Carelink summary report received. Battery status OK. Normal device function. No new symptom episodes, tachy episodes, brady, or pause episodes. No new AF episodes. Monthly summary reports and ROV/PRN 

## 2015-08-07 LAB — CUP PACEART REMOTE DEVICE CHECK: MDC IDC SESS DTM: 20170624231130

## 2015-08-13 ENCOUNTER — Ambulatory Visit (INDEPENDENT_AMBULATORY_CARE_PROVIDER_SITE_OTHER): Payer: Medicare PPO | Admitting: Pulmonary Disease

## 2015-08-13 ENCOUNTER — Encounter: Payer: Self-pay | Admitting: Pulmonary Disease

## 2015-08-13 VITALS — BP 134/66 | HR 60 | Ht 63.0 in | Wt 179.0 lb

## 2015-08-13 DIAGNOSIS — J449 Chronic obstructive pulmonary disease, unspecified: Secondary | ICD-10-CM

## 2015-08-13 NOTE — Progress Notes (Signed)
Subjective:    Patient ID: Kari [name], female    DOB: 1928/08/13, 82 y.o.   MRN: [id number]  Synopsis: Referred in 2016 for evaluation of pulmonary hypertension in the setting of COPD. PFT 05-2012-moderate obstructive airways disease with little response to bronchodilator, air trapping, normal diffusion. FVC 2.34/97%, FEV1 1.32/81%, FEV1/FVC 0.56, RV 147%, DLCO 90%. Records from Kari [name] were reviewed where she recently had an implantable event monitor.   HPI Chief Complaint  Patient presents with  . Follow-up    Pt states she is doing well, notes some SOB with exertion.     Kari [name] says that she is breathing much better since the last visit.  She says it is better than it has been in years. She says that she is able to tolerate exercise a little better now and do more around the house.  She doesn't think she has had any side effects from the medicine.  No flares of COPD since the last visit.  However, she feels that her voice may have changed a little since the last visit.     Past Medical History  Diagnosis Date  . COPD (chronic obstructive pulmonary disease) (HCC)   . Thyroid disease   . Osteoporosis   . Sarcoid (HCC)     pulmonary and dermatologic  . Angina at rest [facility](HCC)   . MI, old   . Palpitations   . Abdominal pain   . Sarcoidosis, lung (HCC)   . Atrial fibrillation (HCC)   . Arthritis   . Asthma   . History of gallstones   . History of scarlet fever   . Premature atrial contractions   . Helicobacter pylori gastritis 11/2011  . GERD (gastroesophageal reflux disease)   . Hiatal hernia   . Zenker's diverticulum   . History of echocardiogram     Echo 3/17: Mild LVH, EF 55-60%, mild aortic stenosis (peak 10 mmHg), mild MR, mild LAE      Review of Systems  Constitutional: Positive for fatigue. Negative for fever and chills.  HENT: Negative for postnasal drip, rhinorrhea and sinus pressure.   Respiratory: Positive for cough and shortness of breath. Negative for  wheezing.   Cardiovascular: Positive for leg swelling. Negative for chest pain and palpitations.       Objective:   Physical Exam Filed Vitals:   08/13/15 1339  BP: 134/66  Pulse: 60  Height: 5\' 3"  (1.6 m)  Weight: 179 lb (81.194 kg)  SpO2: 97%   RA  Gen: well appearing HENT: OP clear, TM's clear, neck supple PULM: CTA B, normal percussion CV: RRR, no mgr, trace edema GI: BS+, soft, nontender Derm: no cyanosis or rash Psyche: normal mood and affect       Assessment & Plan:  COPD (chronic obstructive pulmonary disease) (HCC) This has been a stable interval for Kari [name] since we changed her to pulmicort and brovana.  No exacerbations since the last visit.   Plan: Continue pulmicort and bronvan Get a flu shot in the fall F/u 6 months or sooner if needed     Current outpatient prescriptions:  .  albuterol (PROVENTIL HFA;VENTOLIN HFA) 108 (90 BASE) MCG/ACT inhaler, Inhale 1 puff into the lungs every 6 (six) hours as needed for wheezing or shortness of breath. , Disp: , Rfl:  .  arformoterol (BROVANA) 15 MCG/2ML NEBU, Take 2 mLs (15 mcg total) by nebulization 2 (two) times daily., Disp: 120 mL, Rfl: 6 .  budesonide (PULMICORT) 0.5 MG/2ML nebulizer solution, Take 2  mLs (0.5 mg total) by nebulization 2 (two) times daily., Disp: 60 mL, Rfl: 12 .  Calcium Carbonate-Vitamin D (CALTRATE 600+D) 600-400 MG-UNIT per tablet, Take 1 tablet by mouth daily., Disp: , Rfl:  .  Cholecalciferol 2000 UNITS TABS, Take 2,000 Units by mouth daily., Disp: , Rfl:  .  clonazePAM (KLONOPIN) 0.5 MG tablet, Take 0.5 mg by mouth daily as needed for anxiety. , Disp: , Rfl: 0 .  furosemide (LASIX) 20 MG tablet, Take 20 mg by mouth daily., Disp: , Rfl:  .  levothyroxine (SYNTHROID, LEVOTHROID) 112 MCG tablet, Take 112 mcg by mouth daily before breakfast., Disp: , Rfl:  .  metoprolol succinate (TOPROL-XL) 50 MG 24 hr tablet, Take 1 tablet (50 mg total) by mouth daily. Take with or immediately following a  meal. (Patient taking differently: Take 25-50 mg by mouth daily. Takes 0.5--1 tab qd depending on heart rate.), Disp: 90 tablet, Rfl: 3 .  multivitamin (THERAGRAN) per tablet, Take 1 tablet by mouth daily., Disp: , Rfl:  .  potassium chloride (K-DUR) 10 MEQ tablet, Take 10 mEq by mouth daily., Disp: , Rfl:  .  ranitidine (ZANTAC) 300 MG tablet, Take 1 tablet by mouth at bedtime., Disp: , Rfl:

## 2015-08-13 NOTE — Assessment & Plan Note (Signed)
This has been a stable interval for [name] since we changed her to pulmicort and brovana.  No exacerbations since the last visit.   Plan: Continue pulmicort and bronvan Get a flu shot in the fall F/u 6 months or sooner if needed

## 2015-08-13 NOTE — Patient Instructions (Signed)
Keep taking your medications as you are doing Get a flu shot in the fall We will see you back in 6 months or sooner if needed.

## 2015-08-17 ENCOUNTER — Ambulatory Visit (INDEPENDENT_AMBULATORY_CARE_PROVIDER_SITE_OTHER): Payer: Medicare PPO | Admitting: *Deleted

## 2015-08-17 DIAGNOSIS — R002 Palpitations: Secondary | ICD-10-CM | POA: Diagnosis not present

## 2015-08-18 ENCOUNTER — Encounter: Payer: Self-pay | Admitting: Internal Medicine

## 2015-08-18 NOTE — Progress Notes (Signed)
Carelink Summary Report / Loop Recorder 

## 2015-08-24 ENCOUNTER — Telehealth: Payer: Self-pay | Admitting: Pulmonary Disease

## 2015-08-24 NOTE — Telephone Encounter (Signed)
lmtcb for pharmacy

## 2015-08-25 MED ORDER — BUDESONIDE 0.5 MG/2ML IN SUSP: 0.5000 mg | Freq: Two times a day (BID) | RESPIRATORY_TRACT | 5 refills | Status: DC

## 2015-08-25 MED ORDER — ARFORMOTEROL TARTRATE 15 MCG/2ML IN NEBU: 15.0000 ug | INHALATION_SOLUTION | Freq: Two times a day (BID) | RESPIRATORY_TRACT | 5 refills | Status: DC

## 2015-08-25 NOTE — Telephone Encounter (Signed)
Spoke with pharm and gave VO for brovana and budesonide

## 2015-08-25 NOTE — Telephone Encounter (Signed)
lmtcb

## 2015-09-03 ENCOUNTER — Encounter: Payer: Self-pay | Admitting: Internal Medicine

## 2015-09-11 LAB — CUP PACEART REMOTE DEVICE CHECK: Date Time Interrogation Session: 20170725000529

## 2015-09-16 ENCOUNTER — Ambulatory Visit (INDEPENDENT_AMBULATORY_CARE_PROVIDER_SITE_OTHER): Payer: Medicare PPO | Admitting: *Deleted

## 2015-09-16 DIAGNOSIS — R002 Palpitations: Secondary | ICD-10-CM | POA: Diagnosis not present

## 2015-09-17 NOTE — Progress Notes (Signed)
Carelink Summary Report / Loop Recorder 

## 2015-10-07 ENCOUNTER — Encounter: Payer: Self-pay | Admitting: *Deleted

## 2015-10-12 ENCOUNTER — Telehealth: Payer: Self-pay | Admitting: *Deleted

## 2015-10-12 ENCOUNTER — Encounter: Payer: Self-pay | Admitting: Internal Medicine

## 2015-10-12 NOTE — Telephone Encounter (Signed)
LMOVM requesting call back.  Gave device clinic phone number for return call.  Attempted to reach patient at home number, but number is out of service.  Per Dr. Johney FrameAllred, patient should follow-up with Francis Dowseenee Ursuy, PA this week to discuss AF on her LINQ.

## 2015-10-13 NOTE — Telephone Encounter (Signed)
Able to reach patient.  Advised patient that AF was detected on her LINQ.  She is aware of Dr. Jenel LucksAllred's recommendations.  Patient is agreeable to an appointment with Yevette Edwardsenee Urusy, PA on 10/15/15 at 11:30am.  She is appreciative of call and denies additional questions or concerns at this time.

## 2015-10-14 LAB — CUP PACEART REMOTE DEVICE CHECK: Date Time Interrogation Session: 20170824003610

## 2015-10-14 NOTE — Progress Notes (Addendum)
Cardiology Office Note Date:  10/15/2015  Patient ID:  Kari [name], DOB December 30, 1928, MRN [id number] PCP:  PROVIDER NOT IN SYSTEM  Cardiologist:  Dr. Eden Emms Electrophysiologist: Dr. Johney Frame    Chief Complaint:  AF on ILR  History of Present Illness: Kari [name] is a 82 y.o. female with history of remote PAF not on a/c, thyroidectomy, sarcoidosis, COPD, p.HTN, diastolic CHF, comes to the office today to be seen for Dr. Johney Frame, last seen by him June 2016 (prior to that 2013), at that visit her history of AF was not clearly documented, and ILR was planned given c/o palpitations.  Most recently she has been seen by cardiology service, Tereso Newcomer, PA in April of this year, noting an increased salt intake with edema and higher BP her Toprol up-titrated and instruction for salt/fluid management and PRN lasix.  She is feeling well.  She denies any kind of CP, no dizziness, near syncope or syncope.  She is unaware of being in AF today, reports her asthma flares now and again otherwise denies SOB.   She explains that about 7-8 years ago she was caring for an uncle and did some very heavy cleaning with significant  exposure to bleach/chemecal vapors, her asthma flared terribly and required significant steroid use, then is when she had AF for the first time, never placed on a/c though is familiar with warfarin via a family member.  Her asthma will still flare and in fact will continue to require short acting steroid based pulm tx  She denies any bleeding or hematological history, has some kind of history of ulcers, but never a GIB, no recent or planned surgeries, no recent trauma, she describes her gait as steady, does not require ambulatory aids, no reports of falls    Past Medical History:  Diagnosis Date  . Abdominal pain   . Angina at rest [facility])   . Arthritis   . Asthma   . Atrial fibrillation (HCC)   . COPD (chronic obstructive pulmonary disease) (HCC)   . Diverticulosis   . GERD  (gastroesophageal reflux disease)   . Helicobacter pylori gastritis 11/2011  . Hiatal hernia   . History of echocardiogram    Echo 3/17: Mild LVH, EF 55-60%, mild aortic stenosis (peak 10 mmHg), mild MR, mild LAE  . History of gallstones   . History of scarlet fever   . MI, old   . Osteoporosis   . Palpitations   . Premature atrial contractions   . Sarcoid (HCC)    pulmonary and dermatologic  . Sarcoidosis, lung (HCC)   . Thyroid disease   . Zenker's diverticulum     Past Surgical History:  Procedure Laterality Date  . ABDOMINAL HYSTERECTOMY  10/1997  . cardiolite  2000  . CATARACT EXTRACTION Bilateral   . CHOLECYSTECTOMY  04/1996  . COLONOSCOPY    . EP IMPLANTABLE DEVICE N/A 07/23/2014   Procedure: Loop Recorder Insertion;  Surgeon: [name], MD;  Location: [facility] INVASIVE CV LAB;  Service: Cardiovascular;  Laterality: N/A;  . HERNIA REPAIR     inguinal and hiatal/abdominal  . LEG SURGERY     right leg...rod/screws  . THYROIDECTOMY      Current Outpatient Prescriptions  Medication Sig Dispense Refill  . albuterol (PROVENTIL HFA;VENTOLIN HFA) 108 (90 BASE) MCG/ACT inhaler Inhale 1 puff into the lungs every 6 (six) hours as needed for wheezing or shortness of breath.     Marland Kitchen arformoterol (BROVANA) 15 MCG/2ML NEBU Take 2 mLs (15 mcg total)  by nebulization 2 (two) times daily. 120 mL 5  . budesonide (PULMICORT) 0.5 MG/2ML nebulizer solution Take 2 mLs (0.5 mg total) by nebulization 2 (two) times daily. 60 mL 5  . Calcium Carbonate-Vitamin D (CALTRATE 600+D) 600-400 MG-UNIT per tablet Take 1 tablet by mouth daily.    . Cholecalciferol 2000 UNITS TABS Take 2,000 Units by mouth daily.    . clonazePAM (KLONOPIN) 0.5 MG tablet Take 0.5 mg by mouth daily as needed for anxiety.   0  . furosemide (LASIX) 20 MG tablet Take 20 mg by mouth daily.    Marland Kitchen levothyroxine (SYNTHROID, LEVOTHROID) 112 MCG tablet Take 112 mcg by mouth daily before breakfast.    . metoprolol succinate (TOPROL-XL) 50  MG 24 hr tablet Take 1 tablet (50 mg total) by mouth daily. Take with or immediately following a meal. (Patient taking differently: Take 25-50 mg by mouth daily. Takes 0.5--1 tab qd depending on heart rate.) 90 tablet 3  . multivitamin (THERAGRAN) per tablet Take 1 tablet by mouth daily.    . potassium chloride (K-DUR) 10 MEQ tablet Take 10 mEq by mouth daily.    . ranitidine (ZANTAC) 300 MG tablet Take 1 tablet by mouth at bedtime.    . rivaroxaban (XARELTO) 20 MG TABS tablet Take 1 tablet (20 mg total) by mouth daily with supper. 30 tablet 6   No current facility-administered medications for this visit.     Allergies:   Advair diskus [fluticasone-salmeterol]; Aspirin; Dulera [mometasone furo-formoterol fum]; Ivp dye [iodinated diagnostic agents]; Losartan potassium; Amitriptyline; Amlodipine besylate; Azithromycin; Codeine; Morphine and related; Other; Penicillins; Prevacid  [lansoprazole]; Symbicort [budesonide-formoterol fumarate]; and Tramadol   Social History:  The patient  reports that she quit smoking about 47 years ago. Her smoking use included Cigarettes and Pipe. She has a 20.00 pack-year smoking history. She has never used smokeless tobacco. She reports that she drinks alcohol. She reports that she does not use drugs.   Family History:  The patient's family history includes Diabetes in her brother, daughter, and father; Heart disease in her mother; Other in her sister; Rheumatic fever in her sister.  ROS:  Please see the history of present illness.    All other systems are reviewed and otherwise negative.   PHYSICAL EXAM:  VS:  BP 130/80   Pulse (!) 106   Ht 5\' 3"  (1.6 m)   Wt 178 lb (80.7 kg)   BMI 31.53 kg/m  BMI: Body mass index is 31.53 kg/m. Well nourished, well developed, in no acute distress  HEENT: normocephalic, atraumatic  Neck: no JVD, carotid bruits or masses Cardiac:  Irregular RRR; no significant murmurs, no rubs, or gallops Lungs:  clear to auscultation  bilaterally, no wheezing, rhonchi or rales  Abd: soft, nontender MS: no deformity or atrophy Ext: no edema  Skin: warm and dry, no rash Neuro:  No gross deficits appreciated Psych: euthymic mood, full affect  ILR site is stable, no tethering or discomfort   EKG:  Done today and reviewed by myself shows AFib, 80bpm, nonspecific ST/T changes ILR interrogation today: currently in a AF episode, appears to ave been in AF on/off since the 16th with a number of episodes up to 20 hours duration, rates generally appear well controlled.  Echo 04/20/15 Mild LVH, EF 55-60%, mild aortic stenosis (peak 10 mmHg), mild AI, mild MR, mild LAE  Echo 2/16 (at [facility] [place]) EF 60-65%, impaired LV filling, no RWMA, normal RVSF, mod Pul HTN, RVSP 50-55 mmHg,  mild to mod LAE, mild AI  Records report a normal Cardiolite study in 2000  Recent Labs: 04/13/2015: Brain Natriuretic Peptide 98.2; Hemoglobin 12.8; Platelets 245; TSH 2.79 04/27/2015: BUN 20; Creat 0.81; Potassium 4.7; Sodium 137  No results found for requested labs within last 8760 hours.   CrCl cannot be calculated (Patient's most recent lab result is older than the maximum 21 days allowed.).   Wt Readings from Last 3 Encounters:  10/15/15 178 lb (80.7 kg)  08/13/15 179 lb (81.2 kg)  05/14/15 180 lb (81.6 kg)     Other studies reviewed: Additional studies/records reviewed today include: summarized above  ASSESSMENT AND PLAN:  1. Paroxysmal -  persistent Afib     CHA2DS2Vasc is 3, (female/age)     Discussed her embolic/strok risk as well as risk/benefit of full a/c, she does not appear to have any contraindications for a/c and is agreeable to start     Her rate is controlled and does have any clear symptoms with her AF     discussed bleeding precautions, signs of bleeding  We will start Xarelto 20mg  daily, her last labs in April with Calc. Creat Cl 63 BMET and CBC today  2. HTN     stable  3. P.HTN     Dr.  Kendrick FriesMcQuaid  4. Chronic CHF (diastolic)     Appears compensated by exam and weight  Disposition: F/u with Dr. Johney FrameAllred in 3 months given new to a/c, sooner if needed.  Current medicines are reviewed at length with the patient today.  The patient did not have any concerns regarding medicines.  Judith BlonderSigned, Marcelline Temkin Ursy, PA-C 10/15/2015 2:08 PM     [facility] [address] [place] [address] [phone] (office)  [phone] (fax)

## 2015-10-14 NOTE — Progress Notes (Signed)
Carelink summary report received. Battery status OK. Normal device function. No new tachy episodes, brady, or pause episodes. 1 symptom- ECG appears SR PACs. 14 AF 0.6%- all ECGs previously addressed, appear SR PACs. Monthly summary reports and ROV/PRN

## 2015-10-15 ENCOUNTER — Encounter: Payer: Self-pay | Admitting: Physician Assistant

## 2015-10-15 ENCOUNTER — Encounter (INDEPENDENT_AMBULATORY_CARE_PROVIDER_SITE_OTHER): Payer: Self-pay

## 2015-10-15 ENCOUNTER — Telehealth: Payer: Self-pay | Admitting: Internal Medicine

## 2015-10-15 ENCOUNTER — Telehealth: Payer: Self-pay | Admitting: *Deleted

## 2015-10-15 ENCOUNTER — Ambulatory Visit (INDEPENDENT_AMBULATORY_CARE_PROVIDER_SITE_OTHER): Payer: Medicare PPO | Admitting: Physician Assistant

## 2015-10-15 VITALS — BP 130/80 | HR 106 | Ht 63.0 in | Wt 178.0 lb

## 2015-10-15 DIAGNOSIS — I5032 Chronic diastolic (congestive) heart failure: Secondary | ICD-10-CM | POA: Diagnosis not present

## 2015-10-15 DIAGNOSIS — I4891 Unspecified atrial fibrillation: Secondary | ICD-10-CM

## 2015-10-15 DIAGNOSIS — I10 Essential (primary) hypertension: Secondary | ICD-10-CM | POA: Diagnosis not present

## 2015-10-15 LAB — BASIC METABOLIC PANEL
BUN: 22 mg/dL (ref 7–25)
CHLORIDE: 100 mmol/L (ref 98–110)
CO2: 27 mmol/L (ref 20–31)
CREATININE: 0.91 mg/dL — AB (ref 0.60–0.88)
Calcium: 9 mg/dL (ref 8.6–10.4)
Glucose, Bld: 99 mg/dL (ref 65–99)
Potassium: 4.5 mmol/L (ref 3.5–5.3)
SODIUM: 137 mmol/L (ref 135–146)

## 2015-10-15 MED ORDER — RIVAROXABAN 20 MG PO TABS: 20.0000 mg | ORAL_TABLET | Freq: Every day | ORAL | 6 refills | Status: DC

## 2015-10-15 NOTE — Telephone Encounter (Signed)
F/u Message ° °Pt returning RN call. Please call back to discuss  °

## 2015-10-15 NOTE — Patient Instructions (Signed)
Medication Instructions:   START TAKING XARELTO  20  MG ONCE A DAY   If you need a refill on your cardiac medications before your next appointment, please call your pharmacy.  Labwork: CBC AND BMET    Testing/Procedures: NONE ORDER TODAY    Follow-Up: 3 MONTHS  WITH DR Johney FrameALLRED    Any Other Special Instructions Will Be Listed Below (If Applicable).

## 2015-10-15 NOTE — Telephone Encounter (Signed)
LMOVM THAT OFFICE NOTE WILL BE MAILED TO HOME THAT WAS LEFT IN LAB AFTER BLOOD DRAW

## 2015-10-16 ENCOUNTER — Ambulatory Visit (INDEPENDENT_AMBULATORY_CARE_PROVIDER_SITE_OTHER): Payer: Medicare PPO | Admitting: *Deleted

## 2015-10-16 DIAGNOSIS — R002 Palpitations: Secondary | ICD-10-CM | POA: Diagnosis not present

## 2015-10-16 LAB — CBC
HCT: 38.9 % (ref 35.0–45.0)
HEMOGLOBIN: 12.9 g/dL (ref 11.7–15.5)
MCH: 30.6 pg (ref 27.0–33.0)
MCHC: 33.2 g/dL (ref 32.0–36.0)
MCV: 92.2 fL (ref 80.0–100.0)
MPV: 9.6 fL (ref 7.5–12.5)
PLATELETS: 239 10*3/uL (ref 140–400)
RBC: 4.22 MIL/uL (ref 3.80–5.10)
RDW: 13.4 % (ref 11.0–15.0)
WBC: 4.7 10*3/uL (ref 3.8–10.8)

## 2015-10-19 NOTE — Progress Notes (Signed)
Carelink Summary Report / Loop Recorder 

## 2015-10-19 NOTE — Addendum Note (Signed)
Addended by: Oleta MouseVERTON, SHANA M on: 10/19/2015 08:09 AM   Modules accepted: Orders

## 2015-10-21 ENCOUNTER — Telehealth: Payer: Self-pay | Admitting: *Deleted

## 2015-10-21 NOTE — Telephone Encounter (Signed)
-----   Message from [facility] Lynn Urs[place], [place] sent at 10/16/2015  4:55 PM EDT ----- Please let the patient know her labs look OK, stable, no changes made to our plan. Please ask her how she is doing with the Xarelto,    Thanks  [employer]enee

## 2015-10-21 NOTE — Telephone Encounter (Signed)
SPOKE TO PT ABOUT RESULTS AND VERBALIZED UNDERSTANDING  

## 2015-10-28 ENCOUNTER — Ambulatory Visit: Payer: Medicare PPO | Admitting: Internal Medicine

## 2015-11-02 ENCOUNTER — Ambulatory Visit (INDEPENDENT_AMBULATORY_CARE_PROVIDER_SITE_OTHER): Payer: Medicare PPO | Admitting: Pulmonary Disease

## 2015-11-02 ENCOUNTER — Encounter: Payer: Self-pay | Admitting: Pulmonary Disease

## 2015-11-02 DIAGNOSIS — Z23 Encounter for immunization: Secondary | ICD-10-CM

## 2015-11-02 DIAGNOSIS — J411 Mucopurulent chronic bronchitis: Secondary | ICD-10-CM

## 2015-11-02 DIAGNOSIS — R0600 Dyspnea, unspecified: Secondary | ICD-10-CM | POA: Diagnosis not present

## 2015-11-02 MED ORDER — TIOTROPIUM BROMIDE MONOHYDRATE 2.5 MCG/ACT IN AERS: 2.0000 | INHALATION_SPRAY | Freq: Every day | RESPIRATORY_TRACT | 0 refills | Status: DC

## 2015-11-02 NOTE — Patient Instructions (Signed)
Stop H. J. HeinzBrovana Start Spiriva daily Follow-up with us in 2 weeks

## 2015-11-02 NOTE — Progress Notes (Signed)
Subjective:    Patient ID: Kari [name], female    DOB: 1928/07/13, 82 y.o.   MRN: [id number]  Synopsis: Referred in 2016 for evaluation of pulmonary hypertension in the setting of COPD. PFT 05-2012-moderate obstructive airways disease with little response to bronchodilator, air trapping, normal diffusion. FVC 2.34/97%, FEV1 1.32/81%, FEV1/FVC 0.56, RV 147%, DLCO 90%.  HPI Chief Complaint  Patient presents with  . Follow-up    pt c/o SOB, fluid retention, difficulty sleeping X1 month.  pt wants to discuss switching inhalers as she believes this is causing her worsening symptoms.      Mayu says that her breathing was doing really well for months, but in the last month she says it feels like the medicine quit working.  She notes wheezing after about two hours after taking the medicine and this has ben associated with increasing dyspnea.  She has been using the second dose of brovana and pulmicort.    She has been swelling and holding onto fluid more lately.  It is particularly worse today, but it has been off and on more lately.  She feels more swelling in her stomach.  This will make her breathing worse.  She says that sometimes this will be associated with eating certain foods (ice cream for example).    She had a chest cold a few weeks back.    She says that she has been having more trouble with afib lately and she wonders if the brovana is causing it.     Past Medical History:  Diagnosis Date  . Abdominal pain   . Angina at rest [facility])   . Arthritis   . Asthma   . Atrial fibrillation (HCC)   . COPD (chronic obstructive pulmonary disease) (HCC)   . Diverticulosis   . GERD (gastroesophageal reflux disease)   . Helicobacter pylori gastritis 11/2011  . Hiatal hernia   . History of echocardiogram    Echo 3/17: Mild LVH, EF 55-60%, mild aortic stenosis (peak 10 mmHg), mild MR, mild LAE  . History of gallstones   . History of scarlet fever   . MI, old   . Osteoporosis   .  Palpitations   . Premature atrial contractions   . Sarcoid (HCC)    pulmonary and dermatologic  . Sarcoidosis, lung (HCC)   . Thyroid disease   . Zenker's diverticulum       Review of Systems  Constitutional: Positive for fatigue. Negative for chills and fever.  HENT: Negative for postnasal drip, rhinorrhea and sinus pressure.   Respiratory: Positive for cough and shortness of breath. Negative for wheezing.   Cardiovascular: Positive for leg swelling. Negative for chest pain and palpitations.       Objective:   Physical Exam Vitals:   11/02/15 1614  BP: 124/68  Pulse: 74  SpO2: 97%  Weight: 178 lb (80.7 kg)  Height: 5\' 3"  (1.6 m)   RA  Gen: well appearing HENT: OP clear, TM's clear, neck supple PULM: CTA B, normal percussion CV: Irreg irreg, no mgr, trace edema GI: BS+, soft, nontender Derm: no cyanosis or rash Psyche: normal mood and affect  Records from cardiology reviewed where she had pacer interrogations     Assessment & Plan:  Dyspnea I worry that this is related to uncontrolled A. fib which is likely due to the [place].  Plan: Stop H. J. Heinz Spiriva, samples given Follow-up 2 weeks with nurse practitioner, if no improvement in dyspnea then check pro BNP, chest x-ray in  simple spirometry   COPD (chronic obstructive pulmonary disease) (HCC) Her lungs are clear on exam today and I believe this has been a stable interval with the exception of one episode of bronchitis treated by her primary care physician.  Unfortunately it seems as if she is intolerant of the Brovana due to the episodes of atrial fibrillation. As detailed above we are going to change her to [place].  Plan: Continue Pulmicort Stop Brovana Start Spiriva Continue albuterol as needed Flu shot today  Greater than 50% of today's 26 minutes visit spent face-to-face    Current Outpatient Prescriptions:  .  albuterol (PROVENTIL HFA;VENTOLIN HFA) 108 (90 BASE) MCG/ACT inhaler, Inhale  1 puff into the lungs every 6 (six) hours as needed for wheezing or shortness of breath. , Disp: , Rfl:  .  arformoterol (BROVANA) 15 MCG/2ML NEBU, Take 2 mLs (15 mcg total) by nebulization 2 (two) times daily., Disp: 120 mL, Rfl: 5 .  budesonide (PULMICORT) 0.5 MG/2ML nebulizer solution, Take 2 mLs (0.5 mg total) by nebulization 2 (two) times daily., Disp: 60 mL, Rfl: 5 .  Calcium Carbonate-Vitamin D (CALTRATE 600+D) 600-400 MG-UNIT per tablet, Take 1 tablet by mouth daily., Disp: , Rfl:  .  Cholecalciferol 2000 UNITS TABS, Take 2,000 Units by mouth daily., Disp: , Rfl:  .  clonazePAM (KLONOPIN) 0.5 MG tablet, Take 0.5 mg by mouth daily as needed for anxiety. , Disp: , Rfl: 0 .  furosemide (LASIX) 20 MG tablet, Take 20 mg by mouth daily., Disp: , Rfl:  .  levothyroxine (SYNTHROID, LEVOTHROID) 112 MCG tablet, Take 112 mcg by mouth daily before breakfast., Disp: , Rfl:  .  metoprolol succinate (TOPROL-XL) 50 MG 24 hr tablet, Take 1 tablet (50 mg total) by mouth daily. Take with or immediately following a meal. (Patient taking differently: Take 25-50 mg by mouth daily. Takes 0.5--1 tab qd depending on heart rate.), Disp: 90 tablet, Rfl: 3 .  multivitamin (THERAGRAN) per tablet, Take 1 tablet by mouth daily., Disp: , Rfl:  .  potassium chloride (K-DUR) 10 MEQ tablet, Take 10 mEq by mouth daily., Disp: , Rfl:  .  ranitidine (ZANTAC) 300 MG tablet, Take 1 tablet by mouth at bedtime., Disp: , Rfl:  .  rivaroxaban (XARELTO) 20 MG TABS tablet, Take 1 tablet (20 mg total) by mouth daily with supper., Disp: 30 tablet, Rfl: 6

## 2015-11-02 NOTE — Assessment & Plan Note (Signed)
I worry that this is related to uncontrolled A. fib which is likely due to the [place].  Plan: Stop H. J. HeinzBrovana Start Spiriva, samples given Follow-up 2 weeks with nurse practitioner, if no improvement in dyspnea then check pro BNP, chest x-ray in simple spirometry

## 2015-11-02 NOTE — Assessment & Plan Note (Signed)
Her lungs are clear on exam today and I believe this has been a stable interval with the exception of one episode of bronchitis treated by her primary care physician.  Unfortunately it seems as if she is intolerant of the Brovana due to the episodes of atrial fibrillation. As detailed above we are going to change her to [place].  Plan: Continue Pulmicort Stop Brovana Start Spiriva Continue albuterol as needed Flu shot today  Greater than 50% of today's 26 minutes visit spent face-to-face

## 2015-11-16 ENCOUNTER — Ambulatory Visit (INDEPENDENT_AMBULATORY_CARE_PROVIDER_SITE_OTHER): Payer: Medicare PPO | Admitting: *Deleted

## 2015-11-16 DIAGNOSIS — R002 Palpitations: Secondary | ICD-10-CM

## 2015-11-16 NOTE — Progress Notes (Signed)
Carelink Summary Report / Loop Recorder 

## 2015-11-20 LAB — CUP PACEART REMOTE DEVICE CHECK: MDC IDC SESS DTM: 20170923010644

## 2015-11-20 NOTE — Progress Notes (Signed)
Carelink summary report received. Battery status OK. Normal device function. No new tachy episodes, brady, or pause episodes. No new AF episodes. 2 symptom episodes, previously addressed. 6 AF 19.7% previously reviewed. +Xarelto +Metoprolol. Monthly summary reports and ROV/PRN

## 2015-11-24 ENCOUNTER — Ambulatory Visit: Payer: Medicare PPO | Admitting: Pulmonary Disease

## 2015-11-24 ENCOUNTER — Telehealth: Payer: Self-pay | Admitting: Pulmonary Disease

## 2015-11-24 ENCOUNTER — Ambulatory Visit: Payer: Medicare PPO | Admitting: Adult Health

## 2015-11-24 NOTE — Telephone Encounter (Signed)
[place].  Is there any possible way that Dr. Kendrick FriesMcQuaid can still see her? PATIENT IN LOBBY waiting to see if we can see her.

## 2015-11-24 NOTE — Telephone Encounter (Signed)
Per BQ-pt can be added back to his schedule but will have to wait longer than usual since being added on; spoke with patient-she did not want to wait very long but stated she wanted to be seen for acute breathing questions; was approved to see TP at 3:45pm. Pt declined to see TP due to paying high co-pay and would rather have new message taken regarding her questions about her medications and breathing concerns. Pt made appt to see BQ Nov 2017. Will address patients concerns with breathing concerns and medication questions in new phone note.

## 2015-11-24 NOTE — Telephone Encounter (Signed)
Pt would like to know if 1 daily treatment of Brovana would be okay to do since she had to come off her Breo inhaler due to Afib.    BQ Please advise. Thanks.

## 2015-11-25 HISTORY — PX: EYE SURGERY: SHX253

## 2015-11-25 NOTE — Telephone Encounter (Signed)
No.  She had to stop the brovana due to Afib.  She needs to reschedule an appointment, keep it this time, and preferably bring a family member with her next time to help with communication.

## 2015-11-25 NOTE — Telephone Encounter (Signed)
Given the multiple miscommunications (her mixing up Breo and [place], cancelling her appointment then coming to clinic and demanding to be seen only to walk out after we offered an overbook appointment) I don't think it is unreasonable to ask for family support here.  She is supposed to be taking Spiriva daily.  Continue taking that and prn albuterol.  If dyspnea worse then acute OV.

## 2015-11-25 NOTE — Telephone Encounter (Signed)
Spoke with pt. She is aware of BQ's response. Pt asked to be scheduled to see BQ only. I have scheduled pt with BQ on 11/26/15 at 4pm. Advised her again to make sure she brings a family member with her tomorrow. Nothing further was needed.

## 2015-11-25 NOTE — Telephone Encounter (Signed)
Called and spoke with pt and she already has an appt scheduled with BQ on 11/28.  She stated that she will bring a family member with her.    She was very upset that BQ would ask her to do this. She stated that she is not senile and knows what meds she is taking and when she is to take them.  She stated that she will do what is asked of her by BQ>    She stated that she is only taking the rescue inhaler at this time and she stated that her breathing is horrible.  BQ please advise if anything else needs to be addressed before her appt about her meds for her breathing.

## 2015-11-26 ENCOUNTER — Ambulatory Visit: Payer: Medicare PPO | Admitting: Pulmonary Disease

## 2015-12-09 ENCOUNTER — Telehealth: Payer: Self-pay | Admitting: Physician Assistant

## 2015-12-09 NOTE — Telephone Encounter (Signed)
New Message:    Pt says every since she been on the new blood pressure she have not been feeling good.When she walks seems like all the blood goes to her head,feels like its going to burst and she can not hardly breathe when this happen.Her Home Health nurse came this morning and told her to contact her doctor,her heart is out of rhythm this morning.

## 2015-12-09 NOTE — Telephone Encounter (Signed)
Patient called.  Stated that when she stands up she has a pressure feeling in her head.  Thought it might be her b/p medicine.  Has been on the b/p med > 3 months.  Her pulmonologist has been changing her inhalers around.  Says she has been treated for a sinus infection, got better for a while, now back.  I advised that the head pressure is probably caused by the stopped up sinus's.  She said that is what it feels like.  I advised her see her PCP for f/u.

## 2015-12-12 LAB — CUP PACEART REMOTE DEVICE CHECK
Date Time Interrogation Session: 20171023023832
MDC IDC PG IMPLANT DT: 20160629

## 2015-12-12 NOTE — Progress Notes (Signed)
Carelink summary report received. Battery status OK. Normal device function. No new symptom episodes, tachy episodes, brady, or pause episodes. 99% AF, V rates controlled, +Xarelto. AF persistent since onset.  OV with Renee, no clear symptoms. Monthly summary reports and ROV/PRN

## 2015-12-15 ENCOUNTER — Ambulatory Visit (INDEPENDENT_AMBULATORY_CARE_PROVIDER_SITE_OTHER): Payer: Medicare PPO | Admitting: *Deleted

## 2015-12-15 DIAGNOSIS — R002 Palpitations: Secondary | ICD-10-CM

## 2015-12-16 NOTE — Progress Notes (Signed)
Carelink Summary Report / Loop Recorder 

## 2015-12-22 ENCOUNTER — Ambulatory Visit: Payer: Medicare PPO | Admitting: Pulmonary Disease

## 2016-01-08 ENCOUNTER — Ambulatory Visit
Admission: RE | Admit: 2016-01-08 | Discharge: 2016-01-08 | Disposition: A | Discharge status: Home / Self Care | Payer: Medicare PPO | Source: Ambulatory Visit | Attending: Cardiology | Admitting: Cardiology

## 2016-01-08 ENCOUNTER — Telehealth: Payer: Self-pay

## 2016-01-08 ENCOUNTER — Encounter: Payer: Self-pay | Admitting: Cardiology

## 2016-01-08 ENCOUNTER — Ambulatory Visit (INDEPENDENT_AMBULATORY_CARE_PROVIDER_SITE_OTHER): Payer: Medicare PPO | Admitting: Cardiology

## 2016-01-08 VITALS — BP 118/78 | HR 76 | Ht 63.0 in | Wt 179.0 lb

## 2016-01-08 DIAGNOSIS — R0602 Shortness of breath: Secondary | ICD-10-CM

## 2016-01-08 DIAGNOSIS — I4819 Other persistent atrial fibrillation: Secondary | ICD-10-CM

## 2016-01-08 DIAGNOSIS — I5032 Chronic diastolic (congestive) heart failure: Secondary | ICD-10-CM

## 2016-01-08 DIAGNOSIS — I10 Essential (primary) hypertension: Secondary | ICD-10-CM | POA: Diagnosis not present

## 2016-01-08 DIAGNOSIS — R079 Chest pain, unspecified: Secondary | ICD-10-CM

## 2016-01-08 DIAGNOSIS — J449 Chronic obstructive pulmonary disease, unspecified: Secondary | ICD-10-CM

## 2016-01-08 DIAGNOSIS — I481 Persistent atrial fibrillation: Secondary | ICD-10-CM

## 2016-01-08 LAB — CBC
HCT: 36 % (ref 35.0–45.0)
HEMOGLOBIN: 11.8 g/dL (ref 11.7–15.5)
MCH: 30.1 pg (ref 27.0–33.0)
MCHC: 32.8 g/dL (ref 32.0–36.0)
MCV: 91.8 fL (ref 80.0–100.0)
MPV: 8.8 fL (ref 7.5–12.5)
PLATELETS: 211 10*3/uL (ref 140–400)
RBC: 3.92 MIL/uL (ref 3.80–5.10)
RDW: 13.2 % (ref 11.0–15.0)
WBC: 4.3 10*3/uL (ref 3.8–10.8)

## 2016-01-08 MED ORDER — FUROSEMIDE 20 MG PO TABS: ORAL_TABLET | ORAL | 11 refills | Status: DC

## 2016-01-08 NOTE — Patient Instructions (Addendum)
Medication Instructions:  Your physician has recommended you make the following change in your medication:  1.  INCREASE the Lasix to 20 mg taking 2 tablets daily X's 3 days then go back to 1 tablet daily   Labwork: TODAY:  BMET, CBC, & BNP  Testing/Procedures: A chest x-ray takes a picture of the organs and structures inside the chest, including the heart, lungs, and blood vessels. This test can show several things, including, whether the heart is enlarges; whether fluid is building up in the lungs; and whether pacemaker / defibrillator leads are still in place.  Follow-Up: Your physician recommends that you schedule a follow-up appointment in: 1 WEEK WITH SCOTT WEAVER, PA-C   Any Other Special Instructions Will Be Listed Below (If Applicable).   X-rays X-rays are tests that create pictures of the inside of your body using radiation. Different body parts absorb different amounts of radiation, which show up on the X-ray pictures in shades of black, gray, and white. X-rays are used to look for many health conditions, including broken bones, lung problems, and some types of cancer. Tell a health care provider about:  Any allergies you have.  All medicines you are taking, including vitamins, herbs, eye drops, creams, and over-the-counter medicines.  Previous surgeries you have had.  Medical conditions you have. What are the risks? Getting an X-ray is a safe procedure. What happens before the procedure?  Tell the X-ray technician if you are pregnant or might be pregnant.  You may be asked to wear a protective lead apron to hide parts of your body from the X-ray.  You usually will need to undress whatever part of your body needs the X-ray. You will be given a hospital gown to wear, if needed.  You may need to remove your glasses, jewelry, and other metal objects. What happens during the procedure?  The X-ray machine creates a picture by using a tiny burst of radiation. It is  painless.  You may need to have several pictures taken at different angles.  You will need to try to be as still as you can during the examination to get the best possible images. What happens after the procedure?  You will be able to resume your normal activities.  The X-ray will be examined by your health care provider or a radiology specialist.  It is your responsibility to get your test results. Ask your health care provider when to expect your results and how to get them. This information is not intended to replace advice given to you by your health care provider. Make sure you discuss any questions you have with your health care provider. Document Released: 01/10/2005 Document Revised: 09/10/2015 Document Reviewed: 03/06/2013 Elsevier Interactive Patient Education  2017 [employer]    If you need a refill on your cardiac medications before your next appointment, please call your pharmacy.

## 2016-01-08 NOTE — Progress Notes (Signed)
Cardiology Office Note   Date:  01/08/2016   ID:  Kari [name], DOB Oct 19, 1928, MRN [id number]  PCP:  PROVIDER NOT IN SYSTEM  Cardiologist:  Dr. Eden EmmsNishan EP Dr. Johney FrameAllred    Chief Complaint  Patient presents with  . Shortness of Breath      History of Present Illness: Kari Grovelzia [name] is a 82 y.o. female who presents for SOB.   She has a hx of thyroidectomy, sarcoidosis, COPD, pulmonary hypertension, remote PAF. She had a normal Cardiolite study in 2000. She has reported palpitations in the past. Event monitors have demonstrated sinus rhythm and PACs. She ultimately underwent implantation of an ILR. Last seen by Dr. Johney FrameAllred 1/17. ILR to date had not demonstrated any significant arrhythmias. Recent notes in the chart from EP clinic demonstrates on 04/11/15, she had no atrial fibrillation episodes. She had one episode recorded with PACs.   In Sept on OV she was in a fib with controlled rate.  She has increased DOE and at times diaphragmatic pressure.  She does have a large hiatal hernia and this could be causing pressure.  No recent ischemic work up. difficulty with her significant COPD  For lexiscan and dobutamine could cause rapid a fib.      Past Medical History:  Diagnosis Date  . Abdominal pain   . Angina at rest [facility](HCC)   . Arthritis   . Asthma   . Atrial fibrillation (HCC)   . COPD (chronic obstructive pulmonary disease) (HCC)   . Diverticulosis   . GERD (gastroesophageal reflux disease)   . Helicobacter pylori gastritis 11/2011  . Hiatal hernia   . History of echocardiogram    Echo 3/17: Mild LVH, EF 55-60%, mild aortic stenosis (peak 10 mmHg), mild MR, mild LAE  . History of gallstones   . History of scarlet fever   . MI, old   . Osteoporosis   . Palpitations   . Premature atrial contractions   . Sarcoid (HCC)    pulmonary and dermatologic  . Sarcoidosis, lung (HCC)   . Thyroid disease   . Zenker's diverticulum     Past Surgical History:  Procedure Laterality  Date  . ABDOMINAL HYSTERECTOMY  10/1997  . cardiolite  2000  . CATARACT EXTRACTION Bilateral   . CHOLECYSTECTOMY  04/1996  . COLONOSCOPY    . EP IMPLANTABLE DEVICE N/A 07/23/2014   Procedure: Loop Recorder Insertion;  Surgeon: Hillis RangeJames Allred, MD;  Location: [facility] INVASIVE CV LAB;  Service: Cardiovascular;  Laterality: N/A;  . HERNIA REPAIR     inguinal and hiatal/abdominal  . LEG SURGERY     right leg...rod/screws  . THYROIDECTOMY       Current Outpatient Prescriptions  Medication Sig Dispense Refill  . albuterol (PROVENTIL HFA;VENTOLIN HFA) 108 (90 BASE) MCG/ACT inhaler Inhale 1 puff into the lungs every 6 (six) hours as needed for wheezing or shortness of breath.     . budesonide (PULMICORT) 0.5 MG/2ML nebulizer solution Take 2 mLs (0.5 mg total) by nebulization 2 (two) times daily. 60 mL 5  . Calcium Carbonate-Vitamin D (CALTRATE 600+D) 600-400 MG-UNIT per tablet Take 1 tablet by mouth daily.    . Cholecalciferol 2000 UNITS TABS Take 2,000 Units by mouth daily.    . clonazePAM (KLONOPIN) 0.5 MG tablet Take 0.5 mg by mouth daily as needed for anxiety.   0  . furosemide (LASIX) 20 MG tablet Take 20 mg by mouth daily.    Marland Kitchen. levothyroxine (SYNTHROID, LEVOTHROID) 112 MCG tablet Take 112 mcg  by mouth daily before breakfast.    . metoprolol succinate (TOPROL-XL) 50 MG 24 hr tablet Take 1 tablet (50 mg total) by mouth daily. Take with or immediately following a meal. (Patient taking differently: Take 25-50 mg by mouth daily. Takes 0.5--1 tab qd depending on heart rate.) 90 tablet 3  . multivitamin (THERAGRAN) per tablet Take 1 tablet by mouth daily.    . potassium chloride (K-DUR) 10 MEQ tablet Take 10 mEq by mouth daily.    . ranitidine (ZANTAC) 300 MG tablet Take 1 tablet by mouth at bedtime.    . rivaroxaban (XARELTO) 20 MG TABS tablet Take 1 tablet (20 mg total) by mouth daily with supper. 30 tablet 6  . Tiotropium Bromide Monohydrate (SPIRIVA RESPIMAT) 2.5 MCG/ACT AERS Inhale 2 puffs into the  lungs daily. 2 Inhaler 0   No current facility-administered medications for this visit.     Allergies:   Advair diskus [fluticasone-salmeterol]; Aspirin; Dulera [mometasone furo-formoterol fum]; Ivp dye [iodinated diagnostic agents]; Losartan potassium; Amitriptyline; Amlodipine besylate; Azithromycin; Codeine; Morphine and related; Other; Penicillins; Prevacid  [lansoprazole]; Symbicort [budesonide-formoterol fumarate]; and Tramadol    Social History:  The patient  reports that she quit smoking about 47 years ago. Her smoking use included Cigarettes and Pipe. She has a 20.00 pack-year smoking history. She has never used smokeless tobacco. She reports that she drinks alcohol. She reports that she does not use drugs.   Family History:  The patient's family history includes Diabetes in her brother, daughter, and father; Heart disease in her mother; Other in her sister; Rheumatic fever in her sister.    ROS:  General:no colds or fevers, no weight changes Skin:no rashes or ulcers HEENT:no blurred vision, no congestion CV:see HPI PUL:see HPI GI:no diarrhea constipation or melena, no indigestion GU:no hematuria, no dysuria MS:no joint pain, no claudication Neuro:no syncope, no lightheadedness Endo:no diabetes, + thyroid disease  Wt Readings from Last 3 Encounters:  01/08/16 179 lb (81.2 kg)  11/02/15 178 lb (80.7 kg)  10/15/15 178 lb (80.7 kg)     PHYSICAL EXAM: VS:  BP 118/78   Pulse 76   Ht 5\' 3"  (1.6 m)   Wt 179 lb (81.2 kg)   SpO2 98%   BMI 31.71 kg/m  , BMI Body mass index is 31.71 kg/m. General:Pleasant affect, NAD Skin:Warm and dry, brisk capillary refill HEENT:normocephalic, sclera clear, mucus membranes moist Neck:supple,+ JVD, no bruits  Heart:irreg irreg without murmur, gallup, rub or click Lungs:clear without rales, rhonchi, or wheezes ZOX:WRUE, non tender, + BS, do not palpate liver spleen or masses Ext:tr lower ext edema, 1+ pedal pulses, 2+ radial  pulses Neuro:alert and oriented X 3, MAE, follows commands, + facial symmetry    EKG:  EKG is ordered today. The ekg ordered today demonstrates a fib rate of 70 non specific ST abnormality.    Recent Labs: 04/13/2015: Brain Natriuretic Peptide 98.2; TSH 2.79 10/15/2015: BUN 22; Creat 0.91; Hemoglobin 12.9; Platelets 239; Potassium 4.5; Sodium 137    Lipid Panel    Component Value Date/Time   TRIG 62 02/28/2013 0132       Other studies Reviewed: Additional studies/ records that were reviewed today include: . ECHO: Study Conclusions  - Left ventricle: The cavity size was mildly dilated. Wall   thickness was increased in a pattern of mild LVH. Systolic   function was normal. The estimated ejection fraction was in the   range of 55% to 60%. - Aortic valve: There was mild stenosis. There was  mild   regurgitation. Valve area (Vmax): 2.23 cm^2. - Mitral valve: There was mild regurgitation. - Left atrium: The atrium was mildly dilated. - Atrial septum: No defect or patent foramen ovale was identified  ASSESSMENT AND PLAN:  1.  DOE and chest tightness.  Will plan for 40 mg lasix for 3 days then back to 20 mg daily.  Will check BNP, BMP and CBC.  This could be CHF alone,  Or her hiatal hernia.  I discussed with Dr. Delton SeeNelson DOD and will treat HF first thenif tightness continues decide on nuc vs. Cath.  2. Persistent a fib- has been in since Sept.  Rate faster with her nebulizers.  She is on Xarelto for Cha2DS2VASc score 3.  No bleeding.  3. HTN stable.  4. Pulmonary HTN   5. Chronic diastolic HF with HF today. .    Follow up with APP next week and keep follow up appt with Dr. Johney FrameAllred.    Current medicines are reviewed with the patient today.  The patient Has no concerns regarding medicines.  The following changes have been made:  See above Labs/ tests ordered today include:see above  Disposition:   FU:  see above  Signed, Nada BoozerLaura Lashannon Bresnan, NP  01/08/2016 3:05 PM    [facility] [address], [place], [place]  [address]/ [address] [place], [place] Phone: [phone]; Fax: [phone]  [phone]

## 2016-01-08 NOTE — Telephone Encounter (Signed)
The pt called into the office with assistance from her HHN. The pt complains of SOB with exertion. When the pt gets up to move around she feels the blood go to her head and she has to sit down.  Initially when she sits down her BP is 185/90 and pulse is 100. After she sits for a few minutes her BP and pulse normalize.  The pt said her weight is up 4 lbs since yesterday and she has swelling in her abdomen and legs.  The pt currently takes lasix 20mg  daily and per pt Dr Johney FrameAllred told her she could take an extra dosage if needed for symptoms.  The pt did this one week ago.  I advised the pt to go ahead and take an additional 20mg  now.  The HHN would like the pt evaluated today due to symptoms.  I have arranged appointment for the pt this afternoon.

## 2016-01-09 LAB — BRAIN NATRIURETIC PEPTIDE: BRAIN NATRIURETIC PEPTIDE: 207.7 pg/mL — AB (ref <100)

## 2016-01-11 ENCOUNTER — Telehealth: Payer: Self-pay | Admitting: Internal Medicine

## 2016-01-11 NOTE — Telephone Encounter (Signed)
Mrs. Sheppard CoilCheshire is returning a call about her test results . Thanks

## 2016-01-11 NOTE — Telephone Encounter (Signed)
Returned pts call and went over her lab results and he cxr. She verbalized understanding.

## 2016-01-14 ENCOUNTER — Ambulatory Visit (INDEPENDENT_AMBULATORY_CARE_PROVIDER_SITE_OTHER): Payer: Medicare PPO | Admitting: *Deleted

## 2016-01-14 DIAGNOSIS — R002 Palpitations: Secondary | ICD-10-CM

## 2016-01-15 ENCOUNTER — Ambulatory Visit (INDEPENDENT_AMBULATORY_CARE_PROVIDER_SITE_OTHER): Payer: Medicare PPO | Admitting: Internal Medicine

## 2016-01-15 ENCOUNTER — Encounter: Payer: Self-pay | Admitting: Physician Assistant

## 2016-01-15 ENCOUNTER — Encounter: Payer: Self-pay | Admitting: Internal Medicine

## 2016-01-15 ENCOUNTER — Ambulatory Visit (INDEPENDENT_AMBULATORY_CARE_PROVIDER_SITE_OTHER): Payer: Medicare PPO | Admitting: Physician Assistant

## 2016-01-15 VITALS — BP 140/72 | HR 92 | Ht 63.0 in | Wt 178.4 lb

## 2016-01-15 VITALS — BP 136/80 | HR 78 | Ht 63.0 in | Wt 179.4 lb

## 2016-01-15 DIAGNOSIS — I509 Heart failure, unspecified: Secondary | ICD-10-CM

## 2016-01-15 DIAGNOSIS — I10 Essential (primary) hypertension: Secondary | ICD-10-CM

## 2016-01-15 DIAGNOSIS — R1013 Epigastric pain: Secondary | ICD-10-CM

## 2016-01-15 DIAGNOSIS — R079 Chest pain, unspecified: Secondary | ICD-10-CM | POA: Diagnosis not present

## 2016-01-15 DIAGNOSIS — I481 Persistent atrial fibrillation: Secondary | ICD-10-CM | POA: Diagnosis not present

## 2016-01-15 DIAGNOSIS — K219 Gastro-esophageal reflux disease without esophagitis: Secondary | ICD-10-CM

## 2016-01-15 DIAGNOSIS — J411 Mucopurulent chronic bronchitis: Secondary | ICD-10-CM

## 2016-01-15 DIAGNOSIS — I4819 Other persistent atrial fibrillation: Secondary | ICD-10-CM

## 2016-01-15 DIAGNOSIS — I350 Nonrheumatic aortic (valve) stenosis: Secondary | ICD-10-CM

## 2016-01-15 DIAGNOSIS — I5032 Chronic diastolic (congestive) heart failure: Secondary | ICD-10-CM

## 2016-01-15 DIAGNOSIS — K449 Diaphragmatic hernia without obstruction or gangrene: Secondary | ICD-10-CM

## 2016-01-15 MED ORDER — POTASSIUM CHLORIDE CRYS ER 20 MEQ PO TBCR: 20.0000 meq | EXTENDED_RELEASE_TABLET | Freq: Every day | ORAL | 9 refills | Status: DC

## 2016-01-15 MED ORDER — FUROSEMIDE 40 MG PO TABS: ORAL_TABLET | ORAL | 9 refills | Status: DC

## 2016-01-15 MED ORDER — RANITIDINE HCL 150 MG PO TABS: 150.0000 mg | ORAL_TABLET | ORAL | 2 refills | Status: DC

## 2016-01-15 NOTE — Progress Notes (Signed)
Cardiology Office Note:    Date:  01/15/2016   ID:  Kari [name], DOB 20-Jun-1928, MRN [id number]  PCP:  PROVIDER NOT IN SYSTEM  Cardiologist:  Dr. Charlton Haws   Electrophysiologist:  Dr. [name]   Referring MD: No ref. provider found   Chief Complaint  Patient presents with  . Follow-up    CHF    History of Present Illness:    Kari [name] is a 82 y.o. female with a hx of thyroidectomy, sarcoidosis, COPD, pulmonary hypertension, remote PAF. She had a normal Cardiolite study in 2000. She has reported palpitations in the past. Event monitors have demonstrated sinus rhythm and PACs. She ultimately underwent implantation of an ILR. Last seen by Dr. Johney Frame 1/17. Atrial fibrillation was ultimately identified and 9/17 and she was placed on Xarelto.  Last seen by Nada Boozer, NP 01/08/16.  She c/o dyspnea on exertion and chest tightness.  She was felt to be volume overloaded.  Her Lasix dose was increased for 3 days.  She is brought back for close follow up (also to decide on Nuclear stress test vs LHC).    She returns for follow up.  She is here alone.  She noted improved symptoms with the higher dose of Lasix. Her weight went down 7 pounds at home. She did have a salty meal yesterday and felt worse. Overall, she describes dyspnea with more moderate to extreme activities. She sleeps on an incline chronically. She denies PND. She has mild pedal edema. She continues to note increased abdominal girth. She occasionally has chest discomfort. This is not necessarily related to exertion. She denies syncope. She denies bleeding issues. She admits to adherence to her anticoagulation, Xarelto.   Prior CV studies that were reviewed today include:    Echo 04/20/15 Mild LVH, EF 55-60%, mild aortic stenosis (peak 10 mmHg), mild AI, mild MR, mild LAE  Echo 2/16 (at [facility]) EF 60-65%, impaired LV filling, no RWMA, normal RVSF, mod Pul HTN, RVSP 50-55 mmHg, mild to mod LAE,  mild AI   Past Medical History:  Diagnosis Date  . Abdominal pain   . Angina at rest [facility])   . Arthritis   . Asthma   . Atrial fibrillation (HCC)   . COPD (chronic obstructive pulmonary disease) (HCC)   . Diverticulosis   . GERD (gastroesophageal reflux disease)   . Helicobacter pylori gastritis 11/2011  . Hiatal hernia   . History of echocardiogram    Echo 3/17: Mild LVH, EF 55-60%, mild aortic stenosis (peak 10 mmHg), mild MR, mild LAE  . History of gallstones   . History of scarlet fever   . MI, old   . Osteoporosis   . Palpitations   . Premature atrial contractions   . Sarcoid (HCC)    pulmonary and dermatologic  . Sarcoidosis, lung (HCC)   . Thyroid disease   . Zenker's diverticulum     Past Surgical History:  Procedure Laterality Date  . ABDOMINAL HYSTERECTOMY  10/1997  . cardiolite  2000  . CATARACT EXTRACTION Bilateral   . CHOLECYSTECTOMY  04/1996  . COLONOSCOPY    . EP IMPLANTABLE DEVICE N/A 07/23/2014   Procedure: Loop Recorder Insertion;  Surgeon: [name], MD;  [facility];  Service: Cardiovascular;  Laterality: N/A;  . HERNIA REPAIR     inguinal and hiatal/abdominal  . LEG SURGERY     right leg...rod/screws  . THYROIDECTOMY      Current Medications: Current Meds  Medication Sig  . albuterol (PROVENTIL HFA;VENTOLIN HFA) 108 (90 BASE) MCG/ACT inhaler Inhale 1 puff into the lungs every 6 (six) hours as needed for wheezing or shortness of breath.   . Calcium Carbonate-Vitamin D (CALTRATE 600+D) 600-400 MG-UNIT per tablet Take 1 tablet by mouth daily.  . Cholecalciferol 2000 UNITS TABS Take 2,000 Units by mouth daily.  . clonazePAM (KLONOPIN) 0.5 MG tablet Take 0.5 mg by mouth daily as needed for anxiety.   . Fluticasone-Salmeterol (ADVAIR HFA IN) Inhale 1 puff into the lungs daily.  . furosemide (LASIX) 40 MG tablet 1 tablet by mouth daily  . levothyroxine (SYNTHROID, LEVOTHROID) 112 MCG tablet Take 112 mcg by mouth daily before  breakfast.  . metoprolol succinate (TOPROL-XL) 25 MG 24 hr tablet Take 25 mg by mouth daily.  . multivitamin (THERAGRAN) per tablet Take 1 tablet by mouth daily.  . ranitidine (ZANTAC) 300 MG tablet Take 1 tablet by mouth at bedtime.  . rivaroxaban (XARELTO) 20 MG TABS tablet Take 1 tablet (20 mg total) by mouth daily with supper.  . [DISCONTINUED] budesonide (PULMICORT) 0.5 MG/2ML nebulizer solution Take 2 mLs (0.5 mg total) by nebulization 2 (two) times daily.  . [DISCONTINUED] furosemide (LASIX) 20 MG tablet Take 2 tablets by mouth X's 3 days then go back to 1 tablet by mouth daily  . [DISCONTINUED] potassium chloride (K-DUR) 10 MEQ tablet Take 10 mEq by mouth daily.     Allergies:   Advair diskus [fluticasone-salmeterol]; Aspirin; Dulera [mometasone furo-formoterol fum]; Ivp dye [iodinated diagnostic agents]; Losartan potassium; Amitriptyline; Amlodipine besylate; Azithromycin; Codeine; Morphine and related; Other; Penicillins; Prevacid  [lansoprazole]; Symbicort [budesonide-formoterol fumarate]; and Tramadol   Social History   Social History  . Marital status: Divorced    Spouse name: N/A  . Number of children: 4  . Years of education: N/A   Occupational History  . clergy    Social History Main Topics  . Smoking status: Former Smoker    Packs/day: 2.00    Years: 10.00    Types: Cigarettes, Pipe    Quit date: 01/25/1968  . Smokeless tobacco: Never Used     Comment: pt unsure of exact month in 1977  . Alcohol use 0.0 oz/week     Comment: seldom...glass wine  . Drug use: No  . Sexual activity: Not Asked   Other Topics Concern  . None   Social History Narrative   ** Merged History Encounter **         Family History:  The patient's family history includes Diabetes in her brother, daughter, and father; Heart disease in her mother; Other in her sister; Rheumatic fever in her sister.   ROS:   Please see the history of present illness.    Review of Systems  Eyes: Positive  for visual disturbance.  Cardiovascular: Positive for irregular heartbeat.  Respiratory: Positive for shortness of breath.   Musculoskeletal: Positive for back pain and myalgias.  Gastrointestinal: Positive for constipation.  Neurological: Positive for headaches.  Psychiatric/Behavioral: The patient is nervous/anxious.    All other systems reviewed and are negative.   EKGs/Labs/Other Test Reviewed:    EKG:  EKG is not ordered today.  The ekg ordered today demonstrates n/a  Recent Labs: 04/13/2015: TSH 2.79 10/15/2015: BUN 22; Creat 0.91; Potassium 4.5; Sodium 137 01/08/2016: Brain Natriuretic Peptide 207.7; Hemoglobin 11.8; Platelets 211   Recent Lipid Panel    Component Value Date/Time   TRIG 62 02/28/2013 0132     Physical Exam:  VS:  BP 136/80   Pulse 78   Ht 5\' 3"  (1.6 m)   Wt 179 lb 6.4 oz (81.4 kg)   SpO2 97%   BMI 31.78 kg/m     Wt Readings from Last 3 Encounters:  01/15/16 178 lb 6 oz (80.9 kg)  01/15/16 179 lb 6.4 oz (81.4 kg)  01/08/16 179 lb (81.2 kg)     Physical Exam  Constitutional: She is oriented to person, place, and time. She appears well-developed and well-nourished. No distress.  HENT:  Head: Normocephalic and atraumatic.  Eyes: No scleral icterus.  Neck: JVD present.  JVP 7 cm  Cardiovascular: S1 normal and S2 normal.  An irregularly irregular rhythm present.  Murmur heard.  Low-pitched systolic murmur is present with a grade of 1/6  at the upper right sternal border Pulmonary/Chest: Effort normal. She has no wheezes. She has no rales.  Abdominal: She exhibits distension.  Musculoskeletal: She exhibits edema.  Trace bilateral pitting edema  Neurological: She is alert and oriented to person, place, and time.  Skin: Skin is warm and dry.  Psychiatric: She has a normal mood and affect.    ASSESSMENT:    1. Chronic diastolic CHF (congestive heart failure) (HCC)   2. Chest pain, unspecified type   3. Persistent atrial fibrillation (HCC)    4. Mucopurulent chronic bronchitis (HCC)   5. Essential hypertension   6. Aortic valve stenosis, etiology of cardiac valve disease unspecified    PLAN:    In order of problems listed above:  1. Chronic Diastolic CHF - She continues to remain volume overloaded.  She felt better on the higher dose of Lasix.  I am not certain if some of her symptoms are related to atrial fibrillation.  Her volume issues may be related to AFib.  She does admit to eating a lot of salt at times.    -  Increase Lasix to 40 mg QD  -  Increase K+ to 20 mEq QD  -  BMET in 1 week.  2. Chest pain - She has had occasional chest pains.  She has not had a stress test since 2000.  In the event that she needs an antiarrhythmic drug, ischemic heart disease should be ruled out.  She is not actively wheezing.  Therefore, she should be able to tolerate Lexiscan for a stress test.  -  Arrange Lexiscan Myoview  3. Persistent AF - She remains in atrial fibrillation.  She has not undergone DCCV before.  I discussed this with her today.  I advised her that restoring NSR may improve her symptoms.  She has been adherent to Xarelto for > 1 month. She prefers to hold off on DCCV for now.  She would like to see how she does on the higher dose of Lasix first.  I will adjust her Lasix and obtain a Myoview as noted.  I will have her follow up with Dr. [name] to discuss +/- further management of her AFib.   4. COPD -  Followed by Pulmonology.    5. HTN - BP controlled.  6. Aortic stenosis - Mild by echo in 3/17.     Medication Adjustments/Labs and Tests Ordered: Current medicines are reviewed at length with the patient today.  Concerns regarding medicines are outlined above.  Medication changes, Labs and Tests ordered today are outlined in the Patient Instructions noted below. Patient Instructions  Medication Instructions:  Your physician has recommended you make the following change in your medication:  1. Increase Lasix to  (40 mg ) daily 2. Increase Potassium (20 meq) daily  Labwork: Your physician recommends that you return for lab work in: one week/bmet  Testing/Procedures: Your physician has requested that you have a lexiscan myoview. For further information please visit [email or web address]. Please follow instruction sheet, as given.  Follow-Up: Your physician recommends that you schedule a follow-up appointment in: one month with Dr. Johney FrameAllred.  Any Other Special Instructions Will Be Listed Below (If Applicable).  If you need a refill on your cardiac medications before your next appointment, please call your pharmacy.  Signed, Tereso NewcomerScott Weaver, PA-C  01/15/2016 1:38 PM    [facility] [address], [place], [place]  [address] Phone: [phone]; Fax: [phone]

## 2016-01-15 NOTE — Progress Notes (Signed)
   Subjective:    Patient ID: Kari [name], female    DOB: 1928-06-09, 82 y.o.   MRN: [id number]  HPI Kari [name] is an 82 year old female with a past medical history of GERD, small Zenker's diverticulum, moderate to large hiatal hernia, history of H. pylori status post treatment who is here for follow-up. She also has a history of A. fib with CHF, COPD, pulmonary hypertension and sarcoidosis. She was seen earlier today in cardiology for her A. fib and volume overload. They are titrating diuretics and scheduled a stress test. She prevents alone today  She reports that she has been having on and off issues with "acid" burning pain in her stomach. This can radiate to her back. At times she feels like this is gas. Seems to be worse if she eats a lot of salt. At times she feels like there is pressure at her xiphoid pushing up into her lungs. Mild nausea but no vomiting. She's been using ranitidine 300 mg at bedtime. She restarted Carafate recently which helped her in the past prescribed by her primary care provider. She denies dysphagia and odynophagia. Bowel movements a been mostly regular without blood in her stool or melena. She denies chest pain but does feel dyspnea on exertion and is having ongoing issues with lower extremity edema.   Review of Systems As per history of present illness, otherwise negative  Current Medications, Allergies, Past Medical History, Past Surgical History, Family History and Social History were reviewed in [facility] electronic medical record.     Objective:   Physical Exam BP 140/72 (BP Location: Left Arm, Patient Position: Sitting, Cuff Size: Normal)   Pulse 92   Ht 5\' 3"  (1.6 m)   Wt 178 lb 6 oz (80.9 kg)   BMI 31.60 kg/m  Constitutional: Well-developed and well-nourished. No distress. HEENT: Normocephalic and atraumatic. Oropharynx is clear and moist. No oropharyngeal exudate. Conjunctivae are normal.  No scleral icterus. Neck: Neck supple. Trachea  midline. Cardiovascular: Irregularly irregular Pulmonary/chest: Effort normal and breath sounds normal. No wheezing, rales or rhonchi. Abdominal: Soft, nontender, nondistended. Bowel sounds active throughout. Extremities: no clubbing, cyanosis, 1+ pitting edema to mid shin Neurological: Alert and oriented to person place and time. Skin: Skin is warm and dry.  Psychiatric: Normal mood and affect. Behavior is normal.      Assessment & Plan:   82 year old female with a past medical history of GERD, small Zenker's diverticulum, moderate to large hiatal hernia, history of H. pylori status post treatment who is here for follow-up.  1. Epigastric pain/hiatal hernia -- she likely has symptoms from her moderate to large hiatal hernia and we have discussed this today. I'm going to increase her ranitidine. She will begin ranitidine 150 mg in the morning and continue 300 mg at bedtime. Continue Carafate but change into a slurry to use before meals and at bedtime. Carafate can be used as needed once symptoms improve. We discussed low acid and low bloating diet. I've also recommended she eat small more frequent meals and remain upright after eating. We discussed how some of her epigastric pressure may be a cardiac related symptom and this is being evaluated closely by cardiology. They are pursuing further diuresis for volume overload and have scheduled stress testing.  2. A. fib with CHF -- following closely with cardiology  Followup with me as needed  25 minutes spent with the patient today. Greater than 50% was spent in counseling and coordination of care with the patient

## 2016-01-15 NOTE — Patient Instructions (Addendum)
We have sent the following prescriptions to your mail in pharmacy: Ranitidine 150 mg every morning  If you have not heard from your mail in pharmacy within 1 week or if you have not received your medication in the mail, please contact us at [phone] so we may find out why.  Please take your ranitidine 150 mg every morning and 300 mg every night.  Turn your carafate tablets into a "slurry" (put tablet in 10 ml of warm water and mix until dissolved, then drink)- Take three times daily before meals and at bedtime.  Eat small, frequent meals.  Please follow up with Dr Rhea BeltonPyrtle as needed.  If you are age 80 or older, your body mass index should be between 23-30. Your Body mass index is 31.6 kg/m. If this is out of the aforementioned range listed, please consider follow up with your Primary Care Provider.  If you are age 80 or younger, your body mass index should be between 19-25. Your Body mass index is 31.6 kg/m. If this is out of the aformentioned range listed, please consider follow up with your Primary Care Provider.

## 2016-01-15 NOTE — Patient Instructions (Addendum)
Medication Instructions:  Your physician has recommended you make the following change in your medication:  1. Increase Lasix to (40 mg ) daily 2. Increase Potassium (20 meq) daily  Labwork: Your physician recommends that you return for lab work in: one week/bmet  Testing/Procedures: Your physician has requested that you have a lexiscan myoview. For further information please visit [email or web address]. Please follow instruction sheet, as given.  Follow-Up: Your physician recommends that you schedule a follow-up appointment in: one month with Dr. Johney FrameAllred.  Any Other Special Instructions Will Be Listed Below (If Applicable).  If you need a refill on your cardiac medications before your next appointment, please call your pharmacy.

## 2016-01-15 NOTE — Progress Notes (Signed)
Carelink Summary Report / Loop Recorder 

## 2016-01-20 ENCOUNTER — Encounter: Payer: Self-pay | Admitting: Internal Medicine

## 2016-01-20 ENCOUNTER — Ambulatory Visit (INDEPENDENT_AMBULATORY_CARE_PROVIDER_SITE_OTHER): Payer: Medicare PPO | Admitting: Internal Medicine

## 2016-01-20 VITALS — BP 132/80 | HR 102 | Ht 64.0 in | Wt 176.2 lb

## 2016-01-20 DIAGNOSIS — I5033 Acute on chronic diastolic (congestive) heart failure: Secondary | ICD-10-CM | POA: Diagnosis not present

## 2016-01-20 DIAGNOSIS — I481 Persistent atrial fibrillation: Secondary | ICD-10-CM | POA: Diagnosis not present

## 2016-01-20 DIAGNOSIS — R079 Chest pain, unspecified: Secondary | ICD-10-CM

## 2016-01-20 DIAGNOSIS — I10 Essential (primary) hypertension: Secondary | ICD-10-CM | POA: Diagnosis not present

## 2016-01-20 DIAGNOSIS — I4819 Other persistent atrial fibrillation: Secondary | ICD-10-CM

## 2016-01-20 LAB — CUP PACEART INCLINIC DEVICE CHECK
Implantable Pulse Generator Implant Date: 20160629
MDC IDC SESS DTM: 20171227140106

## 2016-01-20 LAB — CBC WITH DIFFERENTIAL/PLATELET
BASOS PCT: 1 %
Basophils Absolute: 44 cells/uL (ref 0–200)
EOS PCT: 3 %
Eosinophils Absolute: 132 cells/uL (ref 15–500)
HCT: 38.5 % (ref 35.0–45.0)
HEMOGLOBIN: 12.6 g/dL (ref 11.7–15.5)
LYMPHS ABS: 1144 {cells}/uL (ref 850–3900)
Lymphocytes Relative: 26 %
MCH: 30.2 pg (ref 27.0–33.0)
MCHC: 32.7 g/dL (ref 32.0–36.0)
MCV: 92.3 fL (ref 80.0–100.0)
MPV: 8.6 fL (ref 7.5–12.5)
Monocytes Absolute: 616 cells/uL (ref 200–950)
Monocytes Relative: 14 %
NEUTROS ABS: 2464 {cells}/uL (ref 1500–7800)
Neutrophils Relative %: 56 %
Platelets: 209 10*3/uL (ref 140–400)
RBC: 4.17 MIL/uL (ref 3.80–5.10)
RDW: 13.2 % (ref 11.0–15.0)
WBC: 4.4 10*3/uL (ref 3.8–10.8)

## 2016-01-20 LAB — BASIC METABOLIC PANEL
BUN: 25 mg/dL (ref 7–25)
CO2: 28 mmol/L (ref 20–31)
CREATININE: 1.19 mg/dL — AB (ref 0.60–0.88)
Calcium: 9.2 mg/dL (ref 8.6–10.4)
Chloride: 99 mmol/L (ref 98–110)
Glucose, Bld: 94 mg/dL (ref 65–99)
POTASSIUM: 4.4 mmol/L (ref 3.5–5.3)
Sodium: 137 mmol/L (ref 135–146)

## 2016-01-20 NOTE — Progress Notes (Signed)
Electrophysiology Office Note   Date:  01/20/2016   ID:  Kari [name], DOB 1928/02/04, MRN [id number]  Primary Electrophysiologist: Hillis RangeJames Dmitri Pettigrew, MD    Chief Complaint  Patient presents with  . Paroxysmal atrial fibrillation (HCC)     History of Present Illness: Kari [name] is a 82 y.o. female who presents today for electrophysiology follow-up.  She has been in AF since September.  She is much more SOB and feels that she is accumulating abdominal fluid with her AF.  She has seen several providers in our office.  She is now on xarelto.  She denies CP recently.    Today, she denies symptoms of orthopnea, PND, lower extremity edema, claudication, dizziness, presyncope, syncope, bleeding, or neurologic sequela. The patient is tolerating medications without difficulties and is otherwise without complaint today.    Past Medical History:  Diagnosis Date  . Abdominal pain   . Angina [facility])   . Arthritis   . Asthma   . Atrial fibrillation (HCC)   . COPD (chronic obstructive pulmonary disease) (HCC)   . Diverticulosis   . GERD (gastroesophageal reflux disease)   . Helicobacter pylori gastritis 11/2011  . Hiatal hernia   . History of echocardiogram    Echo 3/17: Mild LVH, EF 55-60%, mild aortic stenosis (peak 10 mmHg), mild MR, mild LAE  . History of gallstones   . History of scarlet fever   . MI, old   . Osteoporosis   . Palpitations   . Premature atrial contractions   . Sarcoid (HCC)    pulmonary and dermatologic  . Sarcoidosis, lung (HCC)   . Thyroid disease   . Zenker's diverticulum    Past Surgical History:  Procedure Laterality Date  . ABDOMINAL HYSTERECTOMY  10/1997  . cardiolite  2000  . CATARACT EXTRACTION Bilateral   . CHOLECYSTECTOMY  04/1996  . COLONOSCOPY    . EP IMPLANTABLE DEVICE N/A 07/23/2014   Procedure: Loop Recorder Insertion;  Surgeon: Hillis RangeJames Dolphus Linch, MD;  Location: [facility] INVAS[facility] CV LAB;  Service: Cardiovascular;  Laterality: N/A;  . HERNIA  REPAIR     inguinal and hiatal/abdominal  . LEG SURGERY     right leg...rod/screws  . THYROIDECTOMY       Current Outpatient Prescriptions  Medication Sig Dispense Refill  . albuterol (PROVENTIL HFA;VENTOLIN HFA) 108 (90 BASE) MCG/ACT inhaler Inhale 1 puff into the lungs every 6 (six) hours as needed for wheezing or shortness of breath.     . Calcium Carbonate-Vitamin D (CALTRATE 600+D) 600-400 MG-UNIT per tablet Take 1 tablet by mouth daily.    . Cholecalciferol 2000 UNITS TABS Take 2,000 Units by mouth daily.    . clonazePAM (KLONOPIN) 0.5 MG tablet Take 0.5 mg by mouth daily as needed for anxiety.   0  . Fluticasone-Salmeterol (ADVAIR HFA IN) Inhale 1 puff into the lungs daily.    . furosemide (LASIX) 40 MG tablet 1 tablet by mouth daily 30 tablet 9  . levothyroxine (SYNTHROID, LEVOTHROID) 112 MCG tablet Take 112 mcg by mouth daily before breakfast.    . metoprolol succinate (TOPROL-XL) 25 MG 24 hr tablet Take 25 mg by mouth daily.    . multivitamin (THERAGRAN) per tablet Take 1 tablet by mouth daily.    . potassium chloride SA (K-DUR,KLOR-CON) 20 MEQ tablet Take 1 tablet (20 mEq total) by mouth daily. 30 tablet 9  . ranitidine (ZANTAC) 150 MG tablet Take 1 tablet (150 mg total) by mouth every morning. 90 tablet 2  .  ranitidine (ZANTAC) 300 MG tablet Take 1 tablet by mouth at bedtime.    . rivaroxaban (XARELTO) 20 MG TABS tablet Take 1 tablet (20 mg total) by mouth daily with supper. 30 tablet 6   No current facility-administered medications for this visit.     Allergies:   Advair diskus [fluticasone-salmeterol]; Aspirin; Dulera [mometasone furo-formoterol fum]; Ivp dye [iodinated diagnostic agents]; Losartan potassium; Amitriptyline; Amlodipine besylate; Azithromycin; Codeine; Morphine and related; Other; Penicillins; Prevacid  [lansoprazole]; Symbicort [budesonide-formoterol fumarate]; and Tramadol   Social History:  The patient  reports that she quit smoking about 48 years ago. Her  smoking use included Cigarettes and Pipe. She has a 20.00 pack-year smoking history. She has never used smokeless tobacco. She reports that she drinks alcohol. She reports that she does not use drugs.   Family History:  The patient's  family history includes Diabetes in her brother, daughter, and father; Heart disease in her mother; Other in her sister; Rheumatic fever in her sister.    ROS:  Please see the history of present illness.   All other systems are reviewed and negative.    PHYSICAL EXAM: VS:  BP 132/80   Pulse (!) 102   Ht 5\' 4"  (1.626 m)   Wt 176 lb 3.2 oz (79.9 kg)   SpO2 96%   BMI 30.24 kg/m  , BMI Body mass index is 30.24 kg/m. GEN: Well nourished, well developed, in no acute distress  HEENT: normal  Neck: +JVD, no carotid bruits, or masses Cardiac: iRRR; no murmurs, rubs, or gallops,+1 edema  Respiratory:  Bibasilar rales, normal work of breathing GI: soft, nontender, nondistended, + BS MS: no deformity or atrophy  Skin: warm and dry  Neuro:  Strength and sensation are intact Psych: euthymic mood, full affect  ILR monitor reveals persistent afib since September,  V rates are controlled   Recent Labs: 04/13/2015: TSH 2.79 10/15/2015: BUN 22; Creat 0.91; Potassium 4.5; Sodium 137 01/08/2016: Brain Natriuretic Peptide 207.7; Hemoglobin 11.8; Platelets 211    Lipid Panel     Component Value Date/Time   TRIG 62 02/28/2013 0132     Wt Readings from Last 3 Encounters:  01/20/16 176 lb 3.2 oz (79.9 kg)  01/15/16 178 lb 6 oz (80.9 kg)  01/15/16 179 lb 6.4 oz (81.4 kg)     ASSESSMENT AND PLAN:  1.  Persistent afib Symptomatic with worsening CHF symptoms. Rates are mostly controlled chads2vasc score is 4.  She is on xarelto. Therapeutic strategies for afib including rate control and rhythm control were discussed in detail with the patient today. Risk, benefits, and alternatives to cardioversion were discussed today.  She is willing to proceed.  I will  therefore schedule cardioversion at the next available time. Importance of compliance with xarelto was discussed with the patient today.  She is clear that she has missed no doses. If she returns to afib, we will consider AADs.  Options would include flecainide, sotalol, tikosyn, or multaq.  I would avoid amiodarone if possibly given chronic lung disease.  2. Pulmonary hypertension Likely due to chronic lung disease Followed by Dr Kendrick FriesMcQuaid  3. HTN, edema Sodium restriction advised She has vnous insufficiency but does not want to wear support hose She has recently been started on lasix due to worsening diastolic dysfunction with afib.  Reassess once in sinus.  4. Chest pain Denies chest pain recently Will cancel myoview  Labs/ tests ordered today include:  Orders Placed This Encounter  Procedures  . Basic metabolic panel  .  CBC with Differential   Return to see me in 3-4 weeks to assess post cardioversion  Signed, Hillis RangeJames Zion Lint, MD  01/20/2016 1:18 PM     [facility] [address] [place] [address] [phone] (office) [phone] (fax)

## 2016-01-20 NOTE — Patient Instructions (Addendum)
Medication Instructions:  Your physician recommends that you continue on your current medications as directed. Please refer to the Current Medication list given to you today.   Labwork: Your physician recommends that you return for lab work today: BMP/CBC   Testing/Procedures: Your physician has recommended that you have a Cardioversion (DCCV). Electrical Cardioversion uses a jolt of electricity to your heart either through paddles or wired patches attached to your chest. This is a controlled, usually prescheduled, procedure. Defibrillation is done under light anesthesia in the hospital, and you usually go home the day of the procedure. This is done to get your heart back into a normal rhythm. You are not awake for the procedure. Please see the instruction sheet given to you today.--01/29/16  [facility] at 9:00am Do not eat or drink after midnight the night prior to the procedure Do not take any medications the morning of the procedure Will need someone to drive you home after the procedure    Follow-Up: Your physician recommends that you schedule a follow-up appointment as scheduled   Any Other Special Instructions Will Be Listed Below (If Applicable).     If you need a refill on your cardiac medications before your next appointment, please call your pharmacy.

## 2016-01-26 IMAGING — CT CT ABD-PELV W/O CM
2 of 4 series · 16 of 46 positions shown, 18 images · non-contrast
Comparison: 12/13/2011.

CLINICAL DATA: Initial encounter for three-week history of
constipation and lower abdominal/right lower quadrant pain.

EXAM:
CT ABDOMEN AND PELVIS WITHOUT CONTRAST
TECHNIQUE: Multidetector CT imaging of the abdomen and pelvis was performed
following the standard protocol without IV contrast.

[Series 2: ap without · axial · non-contrast · 0.65mm/px · z∈[-470,-90]mm · 13 of 84 slices shown, 15 images]
[im 4/84  soft-tissue]
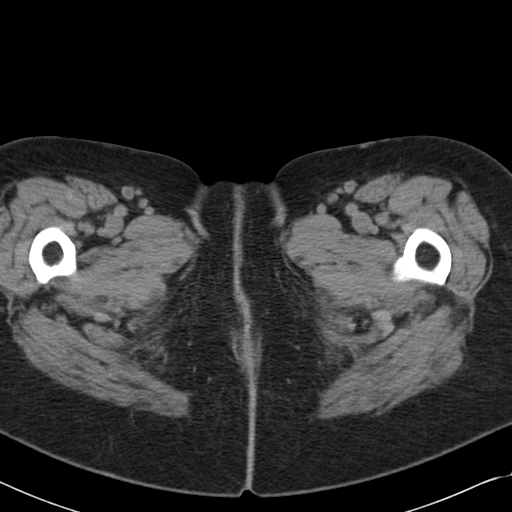
[im 4/84  bone]
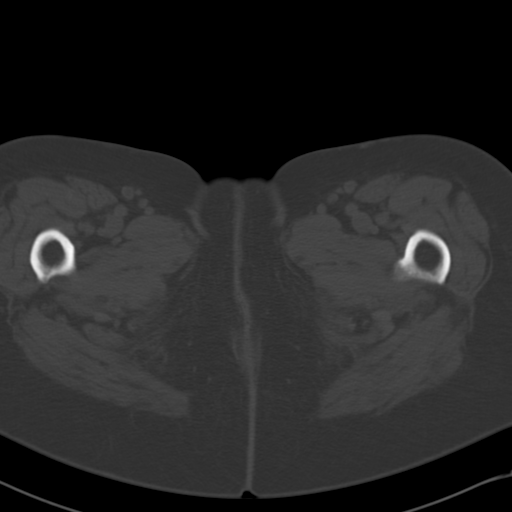
[im 10/84  soft-tissue]
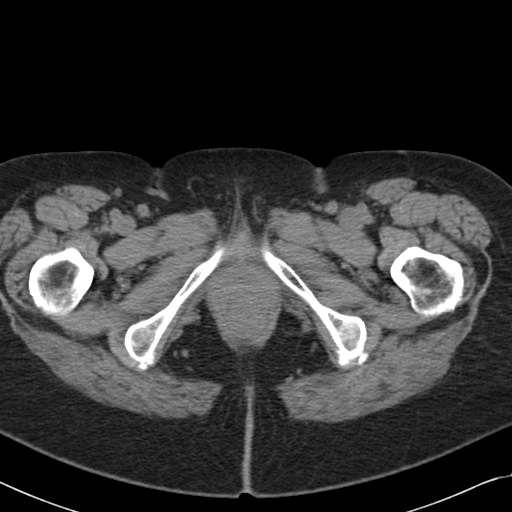
[im 17/84  soft-tissue]
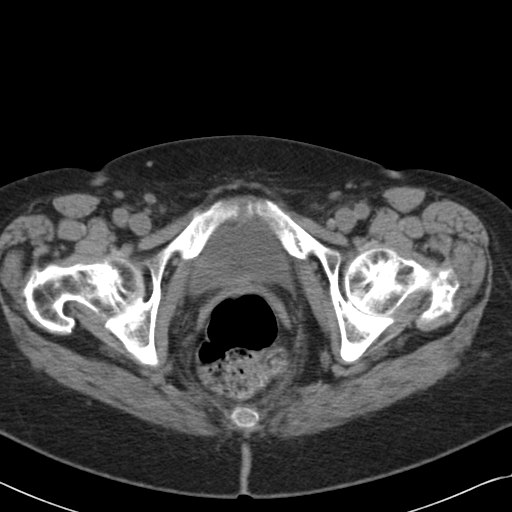
[im 24/84  soft-tissue]
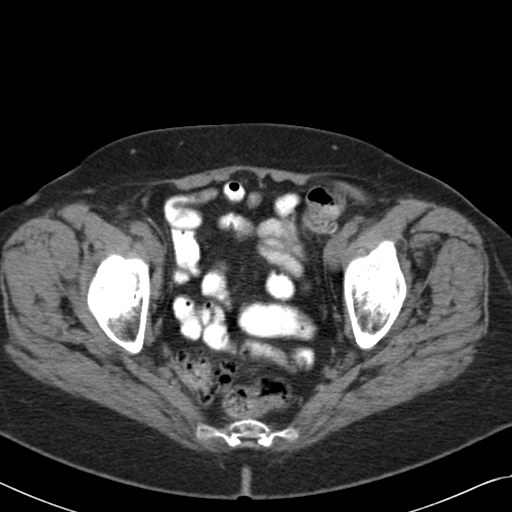
[im 30/84  soft-tissue]
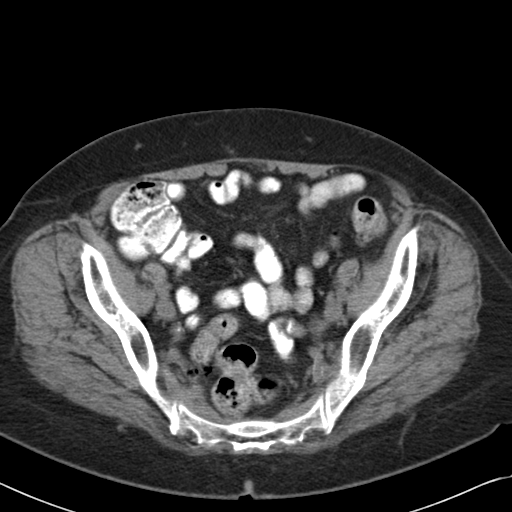
[im 37/84  soft-tissue]
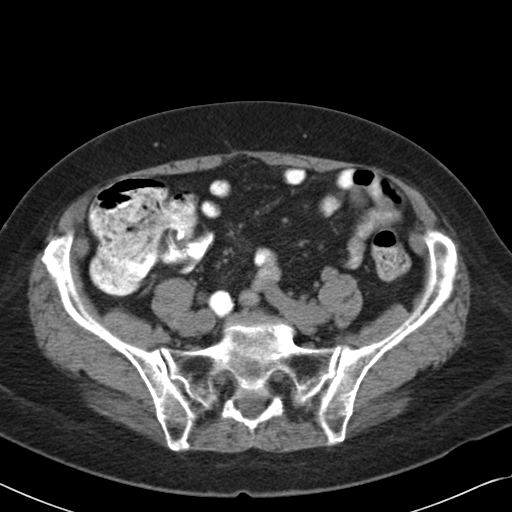
[im 44/84  soft-tissue]
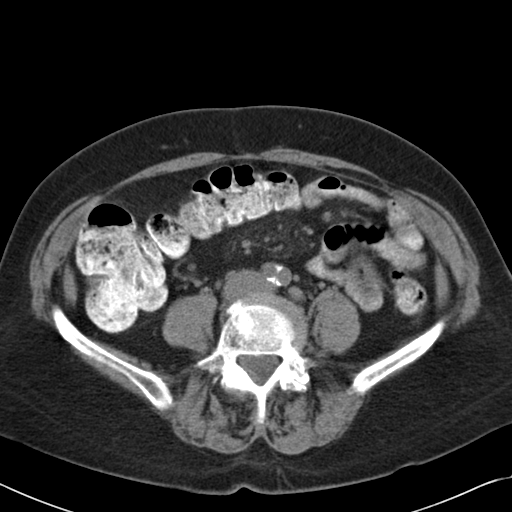
[im 47/84  soft-tissue]
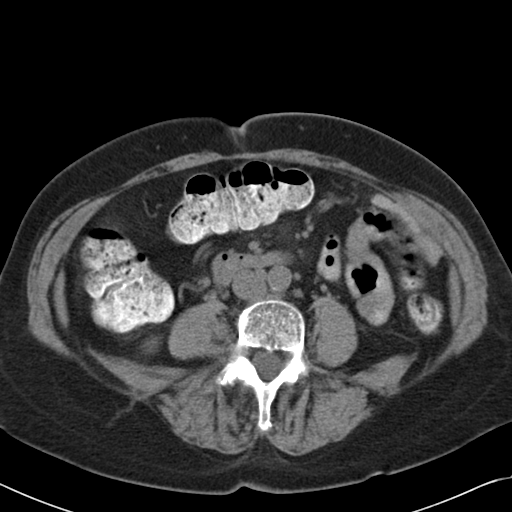
[im 54/84  soft-tissue]
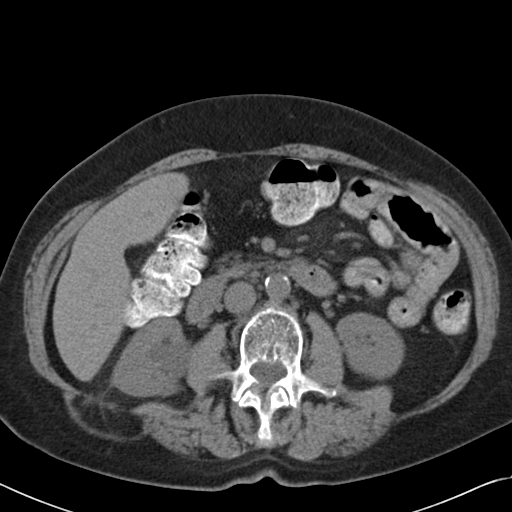
[im 54/84  bone]
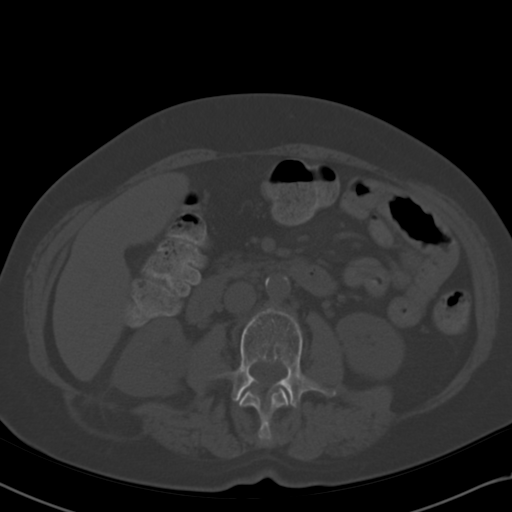
[im 60/84  soft-tissue]
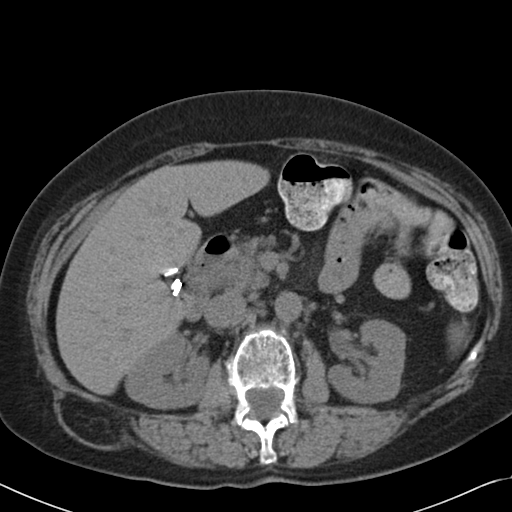
[im 67/84  soft-tissue]
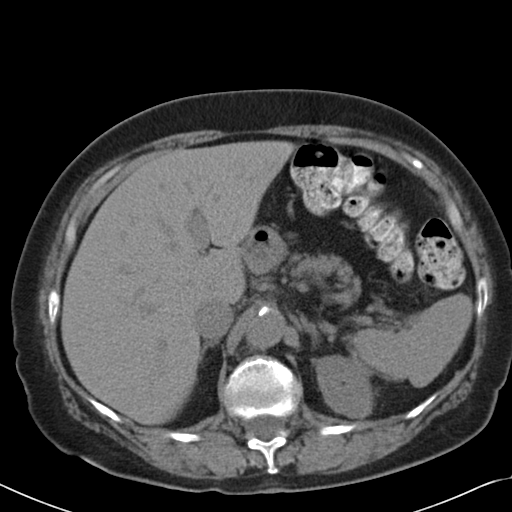
[im 74/84  soft-tissue]
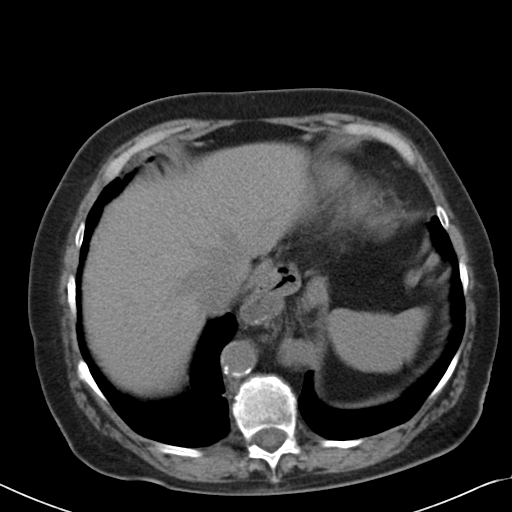
[im 80/84  soft-tissue]
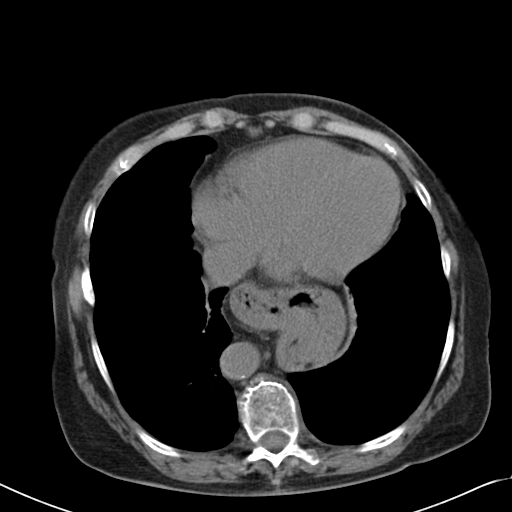

[Series 602: cor · coronal · 0.85mm/px · 3 of 118 slices shown]
[im 40/118  soft-tissue]
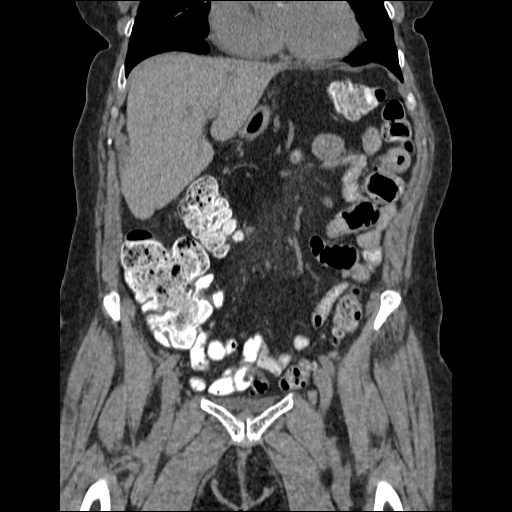
[im 53/118  soft-tissue]
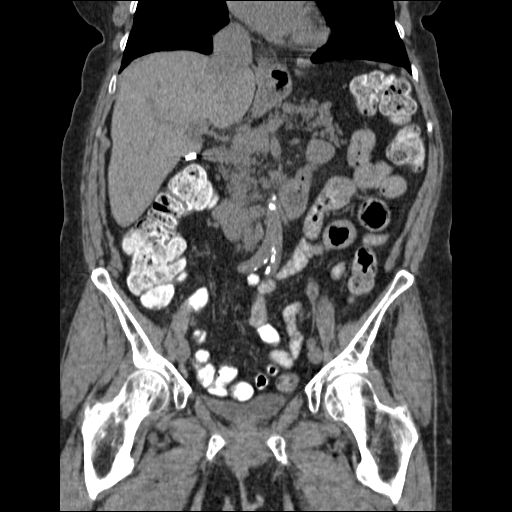
[im 66/118  soft-tissue]
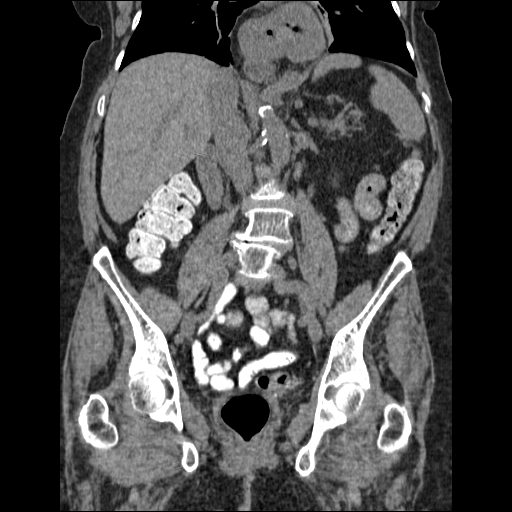

[16 of 46 positions shown; findings below may reference images not displayed]

FINDINGS: Lower chest:  Unremarkable.

Hepatobiliary: No focal abnormality in the liver on this study
without intravenous contrast. No evidence of hepatomegaly.
Gallbladder surgically absent No intrahepatic or extrahepatic
biliary dilation.

Pancreas: No focal mass lesion. No dilatation of the main duct. No
intraparenchymal cyst. No peripancreatic edema.

Spleen: No splenomegaly. No focal mass lesion.

Adrenals/Urinary Tract: No adrenal nodule or mass. Stable prominence
of the right renal pelvis. No discrete mass lesion evident in either
kidney on this study without intravenous contrast material. No
evidence for hydroureter. The urinary bladder appears normal for the
degree of distention.

Stomach/Bowel: Moderate to large hiatal hernia contains
approximately 50-75% of the stomach with organoaxial volvulus
evident, as before. No features to suggest gastric outlet
obstruction. Small duodenum diverticulum evident. No small bowel
wall thickening. No small bowel dilatation. The terminal ileum is
normal. The appendix is not visualized, but there is no edema or
inflammation in the region of the cecum. Diverticular changes are
noted in the left colon without evidence of diverticulitis.

Vascular/Lymphatic: There is abdominal aortic atherosclerosis
without aneurysm. There is no gastrohepatic or hepatoduodenal
ligament lymphadenopathy. No intraperitoneal or retroperitoneal
lymphadenopy. No pelvic sidewall lymphadenopathy.

Reproductive: Uterus is surgically absent. There is no adnexal mass.

Other: No intraperitoneal free fluid.

Musculoskeletal: Bone windows reveal no worrisome lytic or sclerotic
osseous lesions. Right postero lateral abdominal wall hernia
consistent with Petit's Hernia.
IMPRESSION: 1. No findings to explain the patient's history of pain.
2. Slight increase in size of right Petit's hernia, containing only
fat.
3. Stable appearance of moderate to large hiatal hernia.
4. Stable mild fullness of the right renal pelvis.
5. Abdominal aortic atherosclerosis.
6. Left colonic diverticulosis without diverticulitis.

## 2016-01-28 ENCOUNTER — Other Ambulatory Visit: Payer: Self-pay | Admitting: Nurse Practitioner

## 2016-01-28 LAB — CUP PACEART REMOTE DEVICE CHECK
Implantable Pulse Generator Implant Date: 20160629
MDC IDC SESS DTM: 20171122040550

## 2016-01-28 NOTE — Progress Notes (Signed)
Carelink summary report received. Battery status OK. Normal device function. No new tachy episodes, brady, or pause episodes. 24 AF 99.9%. 2 symptom- ECGs appear AF. +Xarelto. Monthly summary reports and ROV/PRN

## 2016-01-29 ENCOUNTER — Encounter (HOSPITAL_COMMUNITY): Payer: Medicare PPO

## 2016-01-29 ENCOUNTER — Ambulatory Visit (HOSPITAL_COMMUNITY)
Admission: RE | Admit: 2016-01-29 | Discharge: 2016-01-29 | Disposition: A | Discharge status: Home / Self Care | Payer: Medicare PPO | Source: Ambulatory Visit | Attending: Cardiovascular Disease | Admitting: Cardiovascular Disease

## 2016-01-29 ENCOUNTER — Ambulatory Visit (HOSPITAL_COMMUNITY): Payer: Medicare PPO | Admitting: Certified Registered Nurse Anesthetist

## 2016-01-29 ENCOUNTER — Encounter (HOSPITAL_COMMUNITY): Payer: Self-pay

## 2016-01-29 ENCOUNTER — Encounter (HOSPITAL_COMMUNITY)
Admission: RE | Disposition: A | Discharge status: Home / Self Care | Payer: Self-pay | Source: Ambulatory Visit | Attending: Cardiovascular Disease

## 2016-01-29 DIAGNOSIS — Z87891 Personal history of nicotine dependence: Secondary | ICD-10-CM | POA: Insufficient documentation

## 2016-01-29 DIAGNOSIS — I110 Hypertensive heart disease with heart failure: Secondary | ICD-10-CM | POA: Diagnosis not present

## 2016-01-29 DIAGNOSIS — Z79899 Other long term (current) drug therapy: Secondary | ICD-10-CM | POA: Diagnosis not present

## 2016-01-29 DIAGNOSIS — I272 Pulmonary hypertension, unspecified: Secondary | ICD-10-CM | POA: Diagnosis not present

## 2016-01-29 DIAGNOSIS — Z7901 Long term (current) use of anticoagulants: Secondary | ICD-10-CM | POA: Insufficient documentation

## 2016-01-29 DIAGNOSIS — I480 Paroxysmal atrial fibrillation: Secondary | ICD-10-CM | POA: Diagnosis not present

## 2016-01-29 DIAGNOSIS — J449 Chronic obstructive pulmonary disease, unspecified: Secondary | ICD-10-CM | POA: Diagnosis not present

## 2016-01-29 DIAGNOSIS — Z5309 Procedure and treatment not carried out because of other contraindication: Secondary | ICD-10-CM | POA: Diagnosis not present

## 2016-01-29 DIAGNOSIS — I509 Heart failure, unspecified: Secondary | ICD-10-CM | POA: Insufficient documentation

## 2016-01-29 DIAGNOSIS — R079 Chest pain, unspecified: Secondary | ICD-10-CM | POA: Diagnosis not present

## 2016-01-29 DIAGNOSIS — K219 Gastro-esophageal reflux disease without esophagitis: Secondary | ICD-10-CM | POA: Insufficient documentation

## 2016-01-29 DIAGNOSIS — I481 Persistent atrial fibrillation: Secondary | ICD-10-CM | POA: Diagnosis not present

## 2016-01-29 DIAGNOSIS — Z7951 Long term (current) use of inhaled steroids: Secondary | ICD-10-CM | POA: Insufficient documentation

## 2016-01-29 DIAGNOSIS — I252 Old myocardial infarction: Secondary | ICD-10-CM | POA: Insufficient documentation

## 2016-01-29 SURGERY — CANCELLED PROCEDURE
Anesthesia: General

## 2016-01-29 MED ORDER — SODIUM CHLORIDE 0.9 % IV SOLN
INTRAVENOUS | Status: DC
  Administered 2016-01-29: 500 mL via INTRAVENOUS

## 2016-01-29 NOTE — Anesthesia Preprocedure Evaluation (Signed)
Anesthesia Evaluation  Patient identified by MRN, date of birth, ID band Patient awake    Reviewed: Allergy & Precautions, NPO status , Patient's Chart, lab work & pertinent test results  Airway Mallampati: II  TM Distance: >3 FB Neck ROM: Full    Dental no notable dental hx.    Pulmonary asthma , COPD, former smoker,    breath sounds clear to auscultation + decreased breath sounds      Cardiovascular hypertension, Normal cardiovascular exam+ dysrhythmias Atrial Fibrillation + Valvular Problems/Murmurs AI  Rhythm:Regular Rate:Normal     Neuro/Psych negative neurological ROS  negative psych ROS   GI/Hepatic negative GI ROS, Neg liver ROS,   Endo/Other  negative endocrine ROS  Renal/GU negative Renal ROS  negative genitourinary   Musculoskeletal negative musculoskeletal ROS (+)   Abdominal   Peds negative pediatric ROS (+)  Hematology negative hematology ROS (+)   Anesthesia Other Findings   Reproductive/Obstetrics negative OB ROS                             Anesthesia Physical Anesthesia Plan  ASA: III  Anesthesia Plan: General   Post-op Pain Management:    Induction: Intravenous  Airway Management Planned: Mask  Additional Equipment:   Intra-op Plan:   Post-operative Plan:   Informed Consent: I have reviewed the patients History and Physical, chart, labs and discussed the procedure including the risks, benefits and alternatives for the proposed anesthesia with the patient or authorized representative who has indicated his/her understanding and acceptance.   Dental advisory given  Plan Discussed with: CRNA and Surgeon  Anesthesia Plan Comments:         Anesthesia Quick Evaluation

## 2016-01-29 NOTE — Progress Notes (Signed)
Ms. Kari [name] admitted this am that she thinks that she may not have taken her Xarelto on at least 1 day last week.   Her A-fib rate is very controlled - HR 75-80.  We will cancel her cardioversion this am and have Dr. Johney FrameAllred / Amber  reschedule again in 3-4 weeks     Kristeen MissPhilip Renaud Celli, MD  01/29/2016 9:47 AM    [facility] [address],  [address] [place], [place]  [address] Pager [phone] Phone: [phone]; Fax: [phone]

## 2016-01-29 NOTE — H&P (View-Only) (Signed)
Electrophysiology Office Note   Date:  01/20/2016   ID:  Kari [name], DOB 1928/02/04, MRN [id number]  Primary Electrophysiologist: Hillis RangeJames Myliyah Rebuck, MD    Chief Complaint  Patient presents with  . Paroxysmal atrial fibrillation (HCC)     History of Present Illness: Kari [name] is a 83 y.o. female who presents today for electrophysiology follow-up.  She has been in AF since September.  She is much more SOB and feels that she is accumulating abdominal fluid with her AF.  She has seen several providers in our office.  She is now on xarelto.  She denies CP recently.    Today, she denies symptoms of orthopnea, PND, lower extremity edema, claudication, dizziness, presyncope, syncope, bleeding, or neurologic sequela. The patient is tolerating medications without difficulties and is otherwise without complaint today.    Past Medical History:  Diagnosis Date  . Abdominal pain   . Angina at rest [facility](H[facility])   . Arthritis   . Asthma   . Atrial fibrillation (HCC)   . COPD (chronic obstructive pulmonary disease) (HCC)   . Diverticulosis   . GERD (gastroesophageal reflux disease)   . Helicobacter pylori gastritis 11/2011  . Hiatal hernia   . History of echocardiogram    Echo 3/17: Mild LVH, EF 55-60%, mild aortic stenosis (peak 10 mmHg), mild MR, mild LAE  . History of gallstones   . History of scarlet fever   . MI, old   . Osteoporosis   . Palpitations   . Premature atrial contractions   . Sarcoid (HCC)    pulmonary and dermatologic  . Sarcoidosis, lung (HCC)   . Thyroid disease   . Zenker's diverticulum    Past Surgical History:  Procedure Laterality Date  . ABDOMINAL HYSTERECTOMY  10/1997  . cardiolite  2000  . CATARACT EXTRACTION Bilateral   . CHOLECYSTECTOMY  04/1996  . COLONOSCOPY    . EP IMPLANTABLE DEVICE N/A 07/23/2014   Procedure: Loop Recorder Insertion;  Surgeon: Hillis RangeJames Naliya Gish, MD;  [facility];  Service: Cardiovascular;  Laterality: N/A;  . HERNIA  REPAIR     inguinal and hiatal/abdominal  . LEG SURGERY     right leg...rod/screws  . THYROIDECTOMY       Current Outpatient Prescriptions  Medication Sig Dispense Refill  . albuterol (PROVENTIL HFA;VENTOLIN HFA) 108 (90 BASE) MCG/ACT inhaler Inhale 1 puff into the lungs every 6 (six) hours as needed for wheezing or shortness of breath.     . Calcium Carbonate-Vitamin D (CALTRATE 600+D) 600-400 MG-UNIT per tablet Take 1 tablet by mouth daily.    . Cholecalciferol 2000 UNITS TABS Take 2,000 Units by mouth daily.    . clonazePAM (KLONOPIN) 0.5 MG tablet Take 0.5 mg by mouth daily as needed for anxiety.   0  . Fluticasone-Salmeterol (ADVAIR HFA IN) Inhale 1 puff into the lungs daily.    . furosemide (LASIX) 40 MG tablet 1 tablet by mouth daily 30 tablet 9  . levothyroxine (SYNTHROID, LEVOTHROID) 112 MCG tablet Take 112 mcg by mouth daily before breakfast.    . metoprolol succinate (TOPROL-XL) 25 MG 24 hr tablet Take 25 mg by mouth daily.    . multivitamin (THERAGRAN) per tablet Take 1 tablet by mouth daily.    . potassium chloride SA (K-DUR,KLOR-CON) 20 MEQ tablet Take 1 tablet (20 mEq total) by mouth daily. 30 tablet 9  . ranitidine (ZANTAC) 150 MG tablet Take 1 tablet (150 mg total) by mouth every morning. 90 tablet 2  .  ranitidine (ZANTAC) 300 MG tablet Take 1 tablet by mouth at bedtime.    . rivaroxaban (XARELTO) 20 MG TABS tablet Take 1 tablet (20 mg total) by mouth daily with supper. 30 tablet 6   No current facility-administered medications for this visit.     Allergies:   Advair diskus [fluticasone-salmeterol]; Aspirin; Dulera [mometasone furo-formoterol fum]; Ivp dye [iodinated diagnostic agents]; Losartan potassium; Amitriptyline; Amlodipine besylate; Azithromycin; Codeine; Morphine and related; Other; Penicillins; Prevacid  [lansoprazole]; Symbicort [budesonide-formoterol fumarate]; and Tramadol   Social History:  The patient  reports that she quit smoking about 48 years ago. Her  smoking use included Cigarettes and Pipe. She has a 20.00 pack-year smoking history. She has never used smokeless tobacco. She reports that she drinks alcohol. She reports that she does not use drugs.   Family History:  The patient's  family history includes Diabetes in her brother, daughter, and father; Heart disease in her mother; Other in her sister; Rheumatic fever in her sister.    ROS:  Please see the history of present illness.   All other systems are reviewed and negative.    PHYSICAL EXAM: VS:  BP 132/80   Pulse (!) 102   Ht 5\' 4"  (1.626 m)   Wt 176 lb 3.2 oz (79.9 kg)   SpO2 96%   BMI 30.24 kg/m  , BMI Body mass index is 30.24 kg/m. GEN: Well nourished, well developed, in no acute distress  HEENT: normal  Neck: +JVD, no carotid bruits, or masses Cardiac: iRRR; no murmurs, rubs, or gallops,+1 edema  Respiratory:  Bibasilar rales, normal work of breathing GI: soft, nontender, nondistended, + BS MS: no deformity or atrophy  Skin: warm and dry  Neuro:  Strength and sensation are intact Psych: euthymic mood, full affect  ILR monitor reveals persistent afib since September,  V rates are controlled   Recent Labs: 04/13/2015: TSH 2.79 10/15/2015: BUN 22; Creat 0.91; Potassium 4.5; Sodium 137 01/08/2016: Brain Natriuretic Peptide 207.7; Hemoglobin 11.8; Platelets 211    Lipid Panel     Component Value Date/Time   TRIG 62 02/28/2013 0132     Wt Readings from Last 3 Encounters:  01/20/16 176 lb 3.2 oz (79.9 kg)  01/15/16 178 lb 6 oz (80.9 kg)  01/15/16 179 lb 6.4 oz (81.4 kg)     ASSESSMENT AND PLAN:  1.  Persistent afib Symptomatic with worsening CHF symptoms. Rates are mostly controlled chads2vasc score is 4.  She is on xarelto. Therapeutic strategies for afib including rate control and rhythm control were discussed in detail with the patient today. Risk, benefits, and alternatives to cardioversion were discussed today.  She is willing to proceed.  I will  therefore schedule cardioversion at the next available time. Importance of compliance with xarelto was discussed with the patient today.  She is clear that she has missed no doses. If she returns to afib, we will consider AADs.  Options would include flecainide, sotalol, tikosyn, or multaq.  I would avoid amiodarone if possibly given chronic lung disease.  2. Pulmonary hypertension Likely due to chronic lung disease Followed by Dr Kendrick FriesMcQuaid  3. HTN, edema Sodium restriction advised She has vnous insufficiency but does not want to wear support hose She has recently been started on lasix due to worsening diastolic dysfunction with afib.  Reassess once in sinus.  4. Chest pain Denies chest pain recently Will cancel myoview  Labs/ tests ordered today include:  Orders Placed This Encounter  Procedures  . Basic metabolic panel  .  CBC with Differential   Return to see me in 3-4 weeks to assess post cardioversion  Signed, Hillis RangeJames Lorean Ekstrand, MD  01/20/2016 1:18 PM     [facility] [address] [place] [address] [phone] (office) [phone] (fax)

## 2016-01-29 NOTE — Interval H&P Note (Signed)
History and Physical Interval Note:  01/29/2016 9:27 AM  Kari [name]  has presented today for surgery, with the diagnosis of AFIB  The various methods of treatment have been discussed with the patient and family. After consideration of risks, benefits and other options for treatment, the patient has consented to  Procedure(s): CARDIOVERSION (N/A) as a surgical intervention .  The patient's history has been reviewed, patient examined, no change in status, stable for surgery.  I have reviewed the patient's chart and labs.  Questions were answered to the patient's satisfaction.     Kristeen MissPhilip Kari [name]

## 2016-01-29 NOTE — OR Nursing (Signed)
Missed a dose of Eliquis last week  Cardioversion aborted

## 2016-02-15 ENCOUNTER — Ambulatory Visit (INDEPENDENT_AMBULATORY_CARE_PROVIDER_SITE_OTHER): Payer: Medicare PPO | Admitting: *Deleted

## 2016-02-15 ENCOUNTER — Ambulatory Visit (INDEPENDENT_AMBULATORY_CARE_PROVIDER_SITE_OTHER): Payer: Medicare PPO | Admitting: Internal Medicine

## 2016-02-15 ENCOUNTER — Encounter: Payer: Self-pay | Admitting: Internal Medicine

## 2016-02-15 VITALS — BP 140/88 | HR 60 | Ht 65.0 in | Wt 177.0 lb

## 2016-02-15 DIAGNOSIS — R002 Palpitations: Secondary | ICD-10-CM | POA: Diagnosis not present

## 2016-02-15 DIAGNOSIS — I10 Essential (primary) hypertension: Secondary | ICD-10-CM

## 2016-02-15 DIAGNOSIS — I4819 Other persistent atrial fibrillation: Secondary | ICD-10-CM

## 2016-02-15 DIAGNOSIS — I481 Persistent atrial fibrillation: Secondary | ICD-10-CM

## 2016-02-15 LAB — CUP PACEART INCLINIC DEVICE CHECK
Date Time Interrogation Session: 20180122153404
Implantable Pulse Generator Implant Date: 20160629

## 2016-02-15 NOTE — Patient Instructions (Addendum)
Patient Instructions   Medication Instructions:  Your physician recommends that you continue on your current medications as directed. Please refer to the Current Medication list given to you today.   Labwork: Your physician recommends that you return for lab work today: BMP/CBC   Testing/Procedures: Your physician has recommended that you have a Cardioversion (DCCV). Electrical Cardioversion uses a jolt of electricity to your heart either through paddles or wired patches attached to your chest. This is a controlled, usually prescheduled, procedure. Defibrillation is done under light anesthesia in the hospital, and you usually go home the day of the procedure. This is done to get your heart back into a normal rhythm. You are not awake for the procedure. Please see the instruction sheet given to you today.--02/29/16  [facility] at 9:30am Do not eat or drink after midnight the night prior to the procedure Do not take any medications the morning of the procedure Will need someone to drive you home after the procedure DO NOT MISS ANY XARELTO    Follow-Up: Your physician recommends that you schedule a follow-up with Dr Johney FrameAllred 6 weeks    Any Other Special Instructions Will Be Listed Below (If Applicable).     If you need a refill on your cardiac medications before your next appointment, please call your pharmacy.

## 2016-02-15 NOTE — Progress Notes (Signed)
Carelink Summary Report / Loop Recorder 

## 2016-02-15 NOTE — Progress Notes (Signed)
Electrophysiology Office Note   Date:  02/15/2016   ID:  Kari [name], DOB 1928-11-25, MRN [id number]  Primary Electrophysiologist: [name], MD    CC: edema   History of Present Illness: Kari [name] is a 83 y.o. female who presents today for electrophysiology follow-up.  She has been in AF since September.  She is much more SOB and feels that she is accumulating abdominal fluid with her AF.  She has seen several providers in our office.  She is now on xarelto.  She denies CP recently.  She was to undergo cardioversion after her last visit with me.  Unfortunately, she missed a dose of xarelto and the procedure was cancelled.  Today, she denies symptoms of orthopnea, PND, lower extremity edema, claudication, dizziness, presyncope, syncope, bleeding, or neurologic sequela. The patient is tolerating medications without difficulties and is otherwise without complaint today.    Past Medical History:  Diagnosis Date  . Abdominal pain   . Angina at rest [facility])   . Arthritis   . Asthma   . Atrial fibrillation (HCC)   . COPD (chronic obstructive pulmonary disease) (HCC)   . Diverticulosis   . GERD (gastroesophageal reflux disease)   . Helicobacter pylori gastritis 11/2011  . Hiatal hernia   . History of echocardiogram    Echo 3/17: Mild LVH, EF 55-60%, mild aortic stenosis (peak 10 mmHg), mild MR, mild LAE  . History of gallstones   . History of scarlet fever   . MI, old   . Osteoporosis   . Palpitations   . Premature atrial contractions   . Sarcoid (HCC)    pulmonary and dermatologic  . Sarcoidosis, lung (HCC)   . Thyroid disease   . Zenker's diverticulum    Past Surgical History:  Procedure Laterality Date  . ABDOMINAL HYSTERECTOMY  10/1997  . cardiolite  2000  . CATARACT EXTRACTION Bilateral   . CHOLECYSTECTOMY  04/1996  . COLONOSCOPY    . EP IMPLANTABLE DEVICE N/A 07/23/2014   Procedure: Loop Recorder Insertion;  Surgeon: [name], MD;  [facility] INVASIVE  CV LAB;  Service: Cardiovascular;  Laterality: N/A;  . EYE SURGERY Right 11/2015  . HERNIA REPAIR     inguinal and hiatal/abdominal  . LEG SURGERY     right leg...rod/screws  . THYROIDECTOMY       Current Outpatient Prescriptions  Medication Sig Dispense Refill  . albuterol (PROVENTIL HFA;VENTOLIN HFA) 108 (90 BASE) MCG/ACT inhaler Inhale 1 puff into the lungs every 6 (six) hours as needed for wheezing or shortness of breath.     . Calcium Carbonate-Vitamin D (CALTRATE 600+D) 600-400 MG-UNIT per tablet Take 1 tablet by mouth daily.    . Cholecalciferol 2000 UNITS TABS Take 2,000 Units by mouth daily.    . clonazePAM (KLONOPIN) 0.5 MG tablet Take 0.5 mg by mouth daily as needed for anxiety.   0  . Fluticasone-Salmeterol (ADVAIR HFA IN) Inhale 1 puff into the lungs daily.    . furosemide (LASIX) 40 MG tablet 1 tablet by mouth daily 30 tablet 9  . levothyroxine (SYNTHROID, LEVOTHROID) 112 MCG tablet Take 112 mcg by mouth daily before breakfast.    . metoprolol succinate (TOPROL-XL) 25 MG 24 hr tablet Take 25 mg by mouth daily.    . multivitamin (THERAGRAN) per tablet Take 1 tablet by mouth daily.    . potassium chloride SA (K-DUR,KLOR-CON) 20 MEQ tablet Take 1 tablet (20 mEq total) by mouth daily. 30 tablet 9  . ranitidine (ZANTAC)  150 MG tablet Take 1 tablet (150 mg total) by mouth every morning. 90 tablet 2  . ranitidine (ZANTAC) 300 MG tablet Take 1 tablet by mouth at bedtime.    . rivaroxaban (XARELTO) 20 MG TABS tablet Take 1 tablet (20 mg total) by mouth daily with supper. 30 tablet 6   No current facility-administered medications for this visit.     Allergies:   Advair diskus [fluticasone-salmeterol]; Aspirin; Dulera [mometasone furo-formoterol fum]; Ivp dye [iodinated diagnostic agents]; Losartan potassium; Amitriptyline; Amlodipine besylate; Azithromycin; Codeine; Morphine and related; Other; Penicillins; Prevacid  [lansoprazole]; Symbicort [budesonide-formoterol fumarate]; and  Tramadol   Social History:  The patient  reports that she quit smoking about 48 years ago. Her smoking use included Cigarettes and Pipe. She has a 20.00 pack-year smoking history. She has never used smokeless tobacco. She reports that she drinks alcohol. She reports that she does not use drugs.   Family History:  The patient's  family history includes Diabetes in her brother, daughter, and father; Heart disease in her mother; Other in her sister; Rheumatic fever in her sister.    ROS:  Please see the history of present illness.   All other systems are reviewed and negative.    PHYSICAL EXAM: VS:  BP 140/88 (BP Location: Left Arm, Patient Position: Sitting, Cuff Size: Large)   Pulse 60   Ht 5\' 5"  (1.651 m)   Wt 177 lb (80.3 kg)   BMI 29.45 kg/m  , BMI Body mass index is 29.45 kg/m. GEN: Well nourished, well developed, in no acute distress  HEENT: normal  Neck: +JVD, no carotid bruits, or masses Cardiac: iRRR; no murmurs, rubs, or gallops,+1 edema  Respiratory:  Bibasilar rales, normal work of breathing GI: soft, nontender, nondistended, + BS MS: no deformity or atrophy  Skin: warm and dry  Neuro:  Strength and sensation are intact Psych: euthymic mood, full affect  ILR monitor reveals persistent afib since September,  V rates are controlled   Recent Labs: 04/13/2015: TSH 2.79 01/08/2016: Brain Natriuretic Peptide 207.7 01/20/2016: BUN 25; Creat 1.19; Hemoglobin 12.6; Platelets 209; Potassium 4.4; Sodium 137    Lipid Panel     Component Value Date/Time   TRIG 62 02/28/2013 0132     Wt Readings from Last 3 Encounters:  02/15/16 177 lb (80.3 kg)  01/29/16 173 lb (78.5 kg)  01/20/16 176 lb 3.2 oz (79.9 kg)     ASSESSMENT AND PLAN:  1.  Persistent afib Symptomatic with worsening CHF symptoms. Rates are mostly controlled chads2vasc score is 4.  She is on xarelto. Risks of cardioversion again discussed with her.  The importance of compliance with xarelto also  discussed. She wishes to proceed.  We will therefore schedule cardioversion at the next available time.  2. Pulmonary hypertension Likely due to chronic lung disease Followed by Dr Kendrick FriesMcQuaid  3. HTN, edema Sodium restriction advised She has venous insufficiency but does not want to wear support hose Reassess once in sinus.   Return to see me in 3-4 weeks to assess post cardioversion  Signed, Hillis RangeJames Jaques Mineer, MD  02/15/2016 12:38 PM     [facility] [address] [place] [address] [phone] (office) [phone] (fax)

## 2016-02-16 LAB — CBC WITH DIFFERENTIAL/PLATELET
Basophils Absolute: 0 10*3/uL (ref 0.0–0.2)
Basos: 1 %
EOS (ABSOLUTE): 0.1 10*3/uL (ref 0.0–0.4)
Eos: 2 %
Hematocrit: 38 % (ref 34.0–46.6)
Hemoglobin: 12.4 g/dL (ref 11.1–15.9)
IMMATURE GRANULOCYTES: 0 %
Immature Grans (Abs): 0 10*3/uL (ref 0.0–0.1)
Lymphocytes Absolute: 1.1 10*3/uL (ref 0.7–3.1)
Lymphs: 21 %
MCH: 29.2 pg (ref 26.6–33.0)
MCHC: 32.6 g/dL (ref 31.5–35.7)
MCV: 90 fL (ref 79–97)
MONOS ABS: 0.7 10*3/uL (ref 0.1–0.9)
Monocytes: 15 %
NEUTROS PCT: 61 %
Neutrophils Absolute: 3 10*3/uL (ref 1.4–7.0)
PLATELETS: 220 10*3/uL (ref 150–379)
RBC: 4.24 x10E6/uL (ref 3.77–5.28)
RDW: 13.8 % (ref 12.3–15.4)
WBC: 4.9 10*3/uL (ref 3.4–10.8)

## 2016-02-16 LAB — BASIC METABOLIC PANEL
BUN/Creatinine Ratio: 19 (ref 12–28)
BUN: 15 mg/dL (ref 8–27)
CO2: 27 mmol/L (ref 18–29)
Calcium: 9.5 mg/dL (ref 8.7–10.3)
Chloride: 93 mmol/L — ABNORMAL LOW (ref 96–106)
Creatinine, Ser: 0.78 mg/dL (ref 0.57–1.00)
GFR calc Af Amer: 80 mL/min/{1.73_m2} (ref >59)
GFR calc non Af Amer: 70 mL/min/{1.73_m2} (ref >59)
GLUCOSE: 90 mg/dL (ref 65–99)
POTASSIUM: 4.4 mmol/L (ref 3.5–5.2)
SODIUM: 135 mmol/L (ref 134–144)

## 2016-02-22 ENCOUNTER — Telehealth (HOSPITAL_COMMUNITY): Payer: Self-pay | Admitting: *Deleted

## 2016-02-22 NOTE — Telephone Encounter (Signed)
Left message on voicemail per DPR in reference to upcoming appointment scheduled on 02/24/16 at 1000 with detailed instructions given per Myocardial Perfusion Study Information Sheet for the test. LM to arrive 15 minutes early, and that it is imperative to arrive on time for appointment to keep from having the test rescheduled. If you need to cancel or reschedule your appointment, please call the office within 24 hours of your appointment. Failure to do so may result in a cancellation of your appointment, and a $50 no show fee. Phone number given for call back for any questions.

## 2016-02-24 ENCOUNTER — Encounter (HOSPITAL_COMMUNITY): Payer: Medicare PPO

## 2016-02-25 ENCOUNTER — Other Ambulatory Visit: Payer: Self-pay | Admitting: *Deleted

## 2016-02-25 ENCOUNTER — Telehealth: Payer: Self-pay | Admitting: *Deleted

## 2016-02-25 MED ORDER — RIVAROXABAN 20 MG PO TABS: 20.0000 mg | ORAL_TABLET | Freq: Every day | ORAL | 5 refills | Status: DC

## 2016-02-25 NOTE — Telephone Encounter (Signed)
Spoke with patient, assisted her in sending manual transmission for ILR via [employer] monitor.  Monitor had become disconnected (error code 3230) as patient had been picking up reader "to see what it did".  Educated patient about monitor and encouraged her to not move it unless we call to request a manual transmission.  Patient verbalizes understanding and denies additional questions or concerns at this time.

## 2016-02-25 NOTE — Telephone Encounter (Signed)
Age 83 Wt 80.3kg 02/15/2016 SrCr 0.78 02/15/2016 Hgb 12.4 Hct 38.0 02/15/2016 saw Dr Johney FrameAllred on 02/15/2016  CrCl 66.84 Refill done for Xarelto 20 mg daily

## 2016-02-29 ENCOUNTER — Other Ambulatory Visit: Payer: Self-pay | Admitting: *Deleted

## 2016-02-29 ENCOUNTER — Encounter (HOSPITAL_COMMUNITY): Payer: Self-pay | Admitting: *Deleted

## 2016-02-29 ENCOUNTER — Ambulatory Visit (HOSPITAL_COMMUNITY): Payer: Medicare PPO | Admitting: Certified Registered Nurse Anesthetist

## 2016-02-29 ENCOUNTER — Encounter (HOSPITAL_COMMUNITY)
Admission: RE | Disposition: A | Discharge status: Home / Self Care | Payer: Self-pay | Source: Ambulatory Visit | Attending: Cardiology

## 2016-02-29 ENCOUNTER — Ambulatory Visit (HOSPITAL_COMMUNITY)
Admission: RE | Admit: 2016-02-29 | Discharge: 2016-02-29 | Disposition: A | Discharge status: Home / Self Care | Payer: Medicare PPO | Source: Ambulatory Visit | Attending: Cardiology | Admitting: Cardiology

## 2016-02-29 DIAGNOSIS — K219 Gastro-esophageal reflux disease without esophagitis: Secondary | ICD-10-CM | POA: Diagnosis not present

## 2016-02-29 DIAGNOSIS — Z79899 Other long term (current) drug therapy: Secondary | ICD-10-CM | POA: Insufficient documentation

## 2016-02-29 DIAGNOSIS — Z7951 Long term (current) use of inhaled steroids: Secondary | ICD-10-CM | POA: Diagnosis not present

## 2016-02-29 DIAGNOSIS — J449 Chronic obstructive pulmonary disease, unspecified: Secondary | ICD-10-CM | POA: Insufficient documentation

## 2016-02-29 DIAGNOSIS — I10 Essential (primary) hypertension: Secondary | ICD-10-CM | POA: Insufficient documentation

## 2016-02-29 DIAGNOSIS — M810 Age-related osteoporosis without current pathological fracture: Secondary | ICD-10-CM | POA: Insufficient documentation

## 2016-02-29 DIAGNOSIS — I481 Persistent atrial fibrillation: Secondary | ICD-10-CM

## 2016-02-29 DIAGNOSIS — I4891 Unspecified atrial fibrillation: Secondary | ICD-10-CM | POA: Diagnosis present

## 2016-02-29 DIAGNOSIS — I272 Pulmonary hypertension, unspecified: Secondary | ICD-10-CM | POA: Diagnosis not present

## 2016-02-29 DIAGNOSIS — Z7901 Long term (current) use of anticoagulants: Secondary | ICD-10-CM | POA: Diagnosis not present

## 2016-02-29 HISTORY — DX: Other complications of anesthesia, initial encounter: T88.59XA

## 2016-02-29 HISTORY — DX: Adverse effect of unspecified anesthetic, initial encounter: T41.45XA

## 2016-02-29 HISTORY — PX: CARDIOVERSION: SHX1299

## 2016-02-29 LAB — POCT I-STAT 4, (NA,K, GLUC, HGB,HCT)
GLUCOSE: 101 mg/dL — AB (ref 65–99)
HEMATOCRIT: 38 % (ref 36.0–46.0)
Hemoglobin: 12.9 g/dL (ref 12.0–15.0)
Potassium: 4.1 mmol/L (ref 3.5–5.1)
SODIUM: 137 mmol/L (ref 135–145)

## 2016-02-29 SURGERY — CARDIOVERSION
Anesthesia: Monitor Anesthesia Care

## 2016-02-29 MED ORDER — RIVAROXABAN 20 MG PO TABS: 20.0000 mg | ORAL_TABLET | Freq: Every day | ORAL | 1 refills | Status: DC

## 2016-02-29 MED ORDER — PROPOFOL 10 MG/ML IV BOLUS
INTRAVENOUS | Status: DC | PRN
  Administered 2016-02-29: 70 mg via INTRAVENOUS

## 2016-02-29 MED ORDER — LIDOCAINE 2% (20 MG/ML) 5 ML SYRINGE
INTRAMUSCULAR | Status: DC | PRN
  Administered 2016-02-29: 60 mg via INTRAVENOUS

## 2016-02-29 MED ORDER — SODIUM CHLORIDE 0.9 % IV SOLN
INTRAVENOUS | Status: DC
  Administered 2016-02-29: 10:00:00 via INTRAVENOUS

## 2016-02-29 NOTE — H&P (Signed)
Kari Range, MD at 02/15/2016 12:00 PM   Status: Signed  Expand All Collapse All      Electrophysiology Office Note   Date:  02/15/2016   ID:  Kari [name], DOB September 25, 1928, MRN [id number]  Primary Electrophysiologist: Kari Range, MD       CC: edema   History of Present Illness: Kari [name] is a 83 y.o. female who presents today for electrophysiology follow-up.  She has been in AF since September.  She is much more SOB and feels that she is accumulating abdominal fluid with her AF.  She has seen several providers in our office.  She is now on xarelto.  She denies CP recently.  She was to undergo cardioversion after her last visit with me.  Unfortunately, she missed a dose of xarelto and the procedure was cancelled.  Today, she denies symptoms of orthopnea, PND, lower extremity edema, claudication, dizziness, presyncope, syncope, bleeding, or neurologic sequela. The patient is tolerating medications without difficulties and is otherwise without complaint today.        Past Medical History:  Diagnosis Date  . Abdominal pain   . Angina at rest [facility])   . Arthritis   . Asthma   . Atrial fibrillation (HCC)   . COPD (chronic obstructive pulmonary disease) (HCC)   . Diverticulosis   . GERD (gastroesophageal reflux disease)   . Helicobacter pylori gastritis 11/2011  . Hiatal hernia   . History of echocardiogram    Echo 3/17: Mild LVH, EF 55-60%, mild aortic stenosis (peak 10 mmHg), mild MR, mild LAE  . History of gallstones   . History of scarlet fever   . MI, old   . Osteoporosis   . Palpitations   . Premature atrial contractions   . Sarcoid (HCC)    pulmonary and dermatologic  . Sarcoidosis, lung (HCC)   . Thyroid disease   . Zenker's diverticulum         Past Surgical History:  Procedure Laterality Date  . ABDOMINAL HYSTERECTOMY  10/1997  . cardiolite  2000  . CATARACT EXTRACTION Bilateral   . CHOLECYSTECTOMY  04/1996  .  COLONOSCOPY    . EP IMPLANTABLE DEVICE N/A 07/23/2014   Procedure: Loop Recorder Insertion;  Surgeon: Kari Range, MD;  Location: [facility];  Service: Cardiovascular;  Laterality: N/A;  . EYE SURGERY Right 11/2015  . HERNIA REPAIR     inguinal and hiatal/abdominal  . LEG SURGERY     right leg...rod/screws  . THYROIDECTOMY             Current Outpatient Prescriptions  Medication Sig Dispense Refill  . albuterol (PROVENTIL HFA;VENTOLIN HFA) 108 (90 BASE) MCG/ACT inhaler Inhale 1 puff into the lungs every 6 (six) hours as needed for wheezing or shortness of breath.     . Calcium Carbonate-Vitamin D (CALTRATE 600+D) 600-400 MG-UNIT per tablet Take 1 tablet by mouth daily.    . Cholecalciferol 2000 UNITS TABS Take 2,000 Units by mouth daily.    . clonazePAM (KLONOPIN) 0.5 MG tablet Take 0.5 mg by mouth daily as needed for anxiety.   0  . Fluticasone-Salmeterol (ADVAIR HFA IN) Inhale 1 puff into the lungs daily.    . furosemide (LASIX) 40 MG tablet 1 tablet by mouth daily 30 tablet 9  . levothyroxine (SYNTHROID, LEVOTHROID) 112 MCG tablet Take 112 mcg by mouth daily before breakfast.    . metoprolol succinate (TOPROL-XL) 25 MG 24 hr tablet Take 25 mg by mouth daily.    Marland Kitchen  multivitamin (THERAGRAN) per tablet Take 1 tablet by mouth daily.    . potassium chloride SA (K-DUR,KLOR-CON) 20 MEQ tablet Take 1 tablet (20 mEq total) by mouth daily. 30 tablet 9  . ranitidine (ZANTAC) 150 MG tablet Take 1 tablet (150 mg total) by mouth every morning. 90 tablet 2  . ranitidine (ZANTAC) 300 MG tablet Take 1 tablet by mouth at bedtime.    . rivaroxaban (XARELTO) 20 MG TABS tablet Take 1 tablet (20 mg total) by mouth daily with supper. 30 tablet 6   No current facility-administered medications for this visit.     Allergies:   Advair diskus [fluticasone-salmeterol]; Aspirin; Dulera [mometasone furo-formoterol fum]; Ivp dye [iodinated diagnostic agents]; Losartan  potassium; Amitriptyline; Amlodipine besylate; Azithromycin; Codeine; Morphine and related; Other; Penicillins; Prevacid  [lansoprazole]; Symbicort [budesonide-formoterol fumarate]; and Tramadol   Social History:  The patient  reports that she quit smoking about 48 years ago. Her smoking use included Cigarettes and Pipe. She has a 20.00 pack-year smoking history. She has never used smokeless tobacco. She reports that she drinks alcohol. She reports that she does not use drugs.   Family History:  The patient's  family history includes Diabetes in her brother, daughter, and father; Heart disease in her mother; Other in her sister; Rheumatic fever in her sister.    ROS:  Please see the history of present illness.   All other systems are reviewed and negative.    PHYSICAL EXAM: VS:  BP 140/88 (BP Location: Left Arm, Patient Position: Sitting, Cuff Size: Large)   Pulse 60   Ht 5\' 5"  (1.651 m)   Wt 177 lb (80.3 kg)   BMI 29.45 kg/m  , BMI Body mass index is 29.45 kg/m. GEN: Well nourished, well developed, in no acute distress  HEENT: normal  Neck: +JVD, no carotid bruits, or masses Cardiac: iRRR; no murmurs, rubs, or gallops,+1 edema  Respiratory:  Bibasilar rales, normal work of breathing GI: soft, nontender, nondistended, + BS MS: no deformity or atrophy  Skin: warm and dry  Neuro:  Strength and sensation are intact Psych: euthymic mood, full affect  ILR monitor reveals persistent afib since September,  V rates are controlled   Recent Labs: 04/13/2015: TSH 2.79 01/08/2016: Brain Natriuretic Peptide 207.7 01/20/2016: BUN 25; Creat 1.19; Hemoglobin 12.6; Platelets 209; Potassium 4.4; Sodium 137    Lipid Panel  Labs(Brief)     Component Value Date/Time   TRIG 62 02/28/2013 0132          Wt Readings from Last 3 Encounters:  02/15/16 177 lb (80.3 kg)  01/29/16 173 lb (78.5 kg)  01/20/16 176 lb 3.2 oz (79.9 kg)     ASSESSMENT AND PLAN:  1.  Persistent  afib Symptomatic with worsening CHF symptoms. Rates are mostly controlled chads2vasc score is 4.  She is on xarelto. Risks of cardioversion again discussed with her.  The importance of compliance with xarelto also discussed. She wishes to proceed.  We will therefore schedule cardioversion at the next available time.  2. Pulmonary hypertension Likely due to chronic lung disease Followed by Dr Kendrick FriesMcQuaid  3. HTN, edema Sodium restriction advised She has venous insufficiency but does not want to wear support hose Reassess once in sinus.   Return to see me in 3-4 weeks to assess post cardioversion  Signed, Kari RangeJames Allred, MD  02/15/2016 12:38 PM     [facility] [address] [place] [address] [phone] (office) [phone] (fax)    Diagnoses  Codes Comments  Persistent atrial fibrillation (HCC) - Primary ICD-9-CM: 427.31 ICD-10-CM: I48.1   Essential hypertension  ICD-9-CM: 401.9 ICD-10-CM: I10        Reason for Visit   Reason for Visit History    Level of Service   Level of Service  PR OFFICE OUTPATIENT VISIT 15 MINUTES [08657]   Follow-up and Disposition   Return in about 6 weeks (around 03/28/2016) for Kari [name].  Follow-up and Disposition History    All Charges for This Encounter    There is been no interval change in history. Discussed cardioversion. She has not missed any medications. She is ready to proceed. Risks and benefits of procedure including stroke, heart attack, death explained.  [name], MD

## 2016-02-29 NOTE — Discharge Instructions (Signed)
Electrical Cardioversion, Care After °This sheet gives you information about how to care for yourself after your procedure. Your health care provider may also give you more specific instructions. If you have problems or questions, contact your health care provider. °What can I expect after the procedure? °After the procedure, it is common to have: °· Some redness on the skin where the shocks were given. °Follow these instructions at home: °· Do not drive for 24 hours if you were given a medicine to help you relax (sedative). °· Take over-the-counter and prescription medicines only as told by your health care provider. °· Ask your health care provider how to check your pulse. Check it often. °· Rest for 48 hours after the procedure or as told by your health care provider. °· Avoid or limit your caffeine use as told by your health care provider. °Contact a health care provider if: °· You feel like your heart is beating too quickly or your pulse is not regular. °· You have a serious muscle cramp that does not go away. °Get help right away if: °· You have discomfort in your chest. °· You are dizzy or you feel faint. °· You have trouble breathing or you are short of breath. °· Your speech is slurred. °· You have trouble moving an arm or leg on one side of your body. °· Your fingers or toes turn cold or blue. °This information is not intended to replace advice given to you by your health care provider. Make sure you discuss any questions you have with your health care provider. °Document Released: 10/31/2012 Document Revised: 08/14/2015 Document Reviewed: 07/17/2015 °Elsevier Interactive Patient Education © 2017 [employer] Inc. ° °

## 2016-02-29 NOTE — Transfer of Care (Signed)
Immediate Anesthesia Transfer of Care Note  Patient: Kari GroveElzia [name]  Procedure(s) Performed: Procedure(s): CARDIOVERSION (N/A)  Patient Location: Endoscopy Unit  Anesthesia Type:General  Level of Consciousness: awake, alert  and oriented  Airway & Oxygen Therapy: Patient Spontanous Breathing  Post-op Assessment: Report given to RN and Post -op Vital signs reviewed and stable  Post vital signs: Reviewed and stable  Last Vitals:  Vitals:   02/29/16 0933  BP: (!) 170/87  Pulse: 78  Resp: (!) 24  Temp: 36.5 C    Last Pain:  Vitals:   02/29/16 0933  TempSrc: Oral         Complications: No apparent anesthesia complications

## 2016-02-29 NOTE — Telephone Encounter (Signed)
Pharmacy requests a ninety day rx. 

## 2016-02-29 NOTE — CV Procedure (Signed)
    Electrical Cardioversion Procedure Note Kari [name] [id number] [birth year]  Procedure: Electrical Cardioversion Indications:  Atrial Fibrillation  Time Out: Verified patient identification, verified procedure,medications/allergies/relevent history reviewed, required imaging and test results available.  Performed  Procedure Details  The patient was NPO after midnight. Anesthesia was administered at the beside  by Dr. Chilton SiGreen with propofol.  Cardioversion was performed with synchronized biphasic defibrillation via AP pads with 120 joules.  1 attempt(s) were performed.  The patient converted to normal sinus rhythm. The patient tolerated the procedure well   IMPRESSION:  Successful cardioversion of atrial fibrillation    Kari [name] 02/29/2016, 11:13 AM

## 2016-02-29 NOTE — Anesthesia Preprocedure Evaluation (Signed)
Anesthesia Evaluation  Patient identified by MRN, date of birth, ID band Patient awake    Reviewed: Allergy & Precautions, NPO status   Airway Mallampati: II       Dental   Pulmonary shortness of breath, asthma , COPD, former smoker,    breath sounds clear to auscultation       Cardiovascular hypertension, + angina + Past MI  + dysrhythmias  Rhythm:Regular Rate:Normal     Neuro/Psych    GI/Hepatic Neg liver ROS, hiatal hernia, GERD  ,  Endo/Other  negative endocrine ROS  Renal/GU negative Renal ROS     Musculoskeletal   Abdominal   Peds  Hematology   Anesthesia Other Findings   Reproductive/Obstetrics                             Anesthesia Physical Anesthesia Plan  ASA: III  Anesthesia Plan: MAC   Post-op Pain Management:    Induction: Intravenous  Airway Management Planned: Simple Face Mask  Additional Equipment:   Intra-op Plan:   Post-operative Plan:   Informed Consent: I have reviewed the patients History and Physical, chart, labs and discussed the procedure including the risks, benefits and alternatives for the proposed anesthesia with the patient or authorized representative who has indicated his/her understanding and acceptance.   Dental advisory given  Plan Discussed with: CRNA, Anesthesiologist and Surgeon  Anesthesia Plan Comments:         Anesthesia Quick Evaluation

## 2016-02-29 NOTE — Anesthesia Postprocedure Evaluation (Signed)
Anesthesia Post Note  Patient: Metallurgist  Procedure(s) Performed: Procedure(s) (LRB): CARDIOVERSION (N/A)  Patient location during evaluation: PACU Anesthesia Type: MAC Level of consciousness: awake Respiratory status: spontaneous breathing Cardiovascular status: stable Anesthetic complications: no       Last Vitals:  Vitals:   02/29/16 1112 02/29/16 1120  BP: 112/64 133/75  Pulse: 77 78  Resp: 18 17  Temp: 36.6 C     Last Pain:  Vitals:   02/29/16 1112  TempSrc: Oral                 Thedford Bunton

## 2016-03-01 ENCOUNTER — Encounter (HOSPITAL_COMMUNITY): Payer: Self-pay | Admitting: Cardiology

## 2016-03-03 LAB — CUP PACEART REMOTE DEVICE CHECK
MDC IDC PG IMPLANT DT: 20160629
MDC IDC SESS DTM: 20171222050912

## 2016-03-07 ENCOUNTER — Other Ambulatory Visit (HOSPITAL_COMMUNITY): Payer: Medicare PPO

## 2016-03-07 NOTE — Progress Notes (Signed)
Cardiology Office Note Date:  03/08/2016  Patient ID:  Kari [name], DOB [birth year], MRN [id number] PCP:  PROVIDER NOT IN SYSTEM  Cardiologist:  Dr. Eden Emms Electrophysiologist: Dr. Johney Frame    Chief Complaint:  1st degree AVblock noted by PMD, referred for evaluation  History of Present Illness: Kari [name] is a 83 y.o. female with history of persistent AFib started on Xarelto Sept 2017, thyroidectomy, sarcoidosis, COPD, p.HTN, diastolic CHF, comes to the office today to be seen for Dr. Johney Frame, last seen by him last month noting progressive fluid accumulation suspect to be secondary to her AF (he had previously planned for DCCV but had to be cancelled because of missed xarelto dose) ultimately undergoing DCCV 02/29/16.  She is feeling so much better since her DCCV, though over the weekend and this morning had the sensation that her heart was beating fast, no particular symptoms other then some stomach bloating (ultimately felt netter after using the restroom) and mentions that this weekend she was eating out and unusually salt heavy foods.  She is breathing easier, able to do her ADL's again without difficulty.  No CP, no dizziness, near syncope or syncope.  She is tolerating the xarelto, denies any bleeding or signs of bleeding  Past Medical History:  Diagnosis Date  . Abdominal pain   . Angina at rest [facility])   . Arthritis   . Asthma   . Atrial fibrillation (HCC)   . Complication of anesthesia    BP drops  . COPD (chronic obstructive pulmonary disease) (HCC)   . Diverticulosis   . GERD (gastroesophageal reflux disease)   . Helicobacter pylori gastritis 11/2011  . Hiatal hernia   . History of echocardiogram    Echo 3/17: Mild LVH, EF 55-60%, mild aortic stenosis (peak 10 mmHg), mild MR, mild LAE  . History of gallstones   . History of scarlet fever   . MI, old   . Osteoporosis   . Palpitations   . Premature atrial contractions   . Sarcoid (HCC)    pulmonary and dermatologic    . Sarcoidosis, lung (HCC)   . Thyroid disease   . Zenker's diverticulum     Past Surgical History:  Procedure Laterality Date  . ABDOMINAL HYSTERECTOMY  10/1997  . cardiolite  2000  . CARDIOVERSION N/A 02/29/2016   Procedure: CARDIOVERSION;  Surgeon: Jake Bathe, MD;  Location: [facility] ENDOSCOPY;  Service: Cardiovascular;  Laterality: N/A;  . CATARACT EXTRACTION Bilateral   . CHOLECYSTECTOMY  04/1996  . COLONOSCOPY    . EP IMPLANTABLE DEVICE N/A 07/23/2014   Procedure: Loop Recorder Insertion;  Surgeon: [name], MD;  Location: [facility] INVASIVE CV LAB;  Service: Cardiovascular;  Laterality: N/A;  . EYE SURGERY Right 11/2015  . HERNIA REPAIR     inguinal and hiatal/abdominal  . LEG SURGERY     right leg...rod/screws  . THYROIDECTOMY      Current Outpatient Prescriptions  Medication Sig Dispense Refill  . albuterol (PROVENTIL HFA;VENTOLIN HFA) 108 (90 BASE) MCG/ACT inhaler Inhale 1 puff into the lungs every 6 (six) hours as needed for wheezing or shortness of breath.     . Calcium Carbonate-Vitamin D (CALTRATE 600+D) 600-400 MG-UNIT per tablet Take 1 tablet by mouth daily.    . Cholecalciferol 2000 UNITS TABS Take 2,000 Units by mouth daily.    . clonazePAM (KLONOPIN) 0.5 MG tablet Take 0.5 mg by mouth daily as needed for anxiety.   0  . Fluticasone-Salmeterol (ADVAIR HFA IN) Inhale 1 puff into  the lungs daily.    . furosemide (LASIX) 40 MG tablet 1 tablet by mouth daily (Patient taking differently: 20 mg. 1 tablet by mouth daily) 30 tablet 9  . levothyroxine (SYNTHROID, LEVOTHROID) 112 MCG tablet Take 112 mcg by mouth daily before breakfast.    . metoprolol succinate (TOPROL-XL) 25 MG 24 hr tablet Take 25 mg by mouth daily.    . multivitamin (THERAGRAN) per tablet Take 1 tablet by mouth daily.    . potassium chloride SA (K-DUR,KLOR-CON) 20 MEQ tablet Take 1 tablet (20 mEq total) by mouth daily. 30 tablet 9  . ranitidine (ZANTAC) 300 MG tablet Take 1 tablet by mouth at bedtime.    .  rivaroxaban (XARELTO) 20 MG TABS tablet Take 1 tablet (20 mg total) by mouth daily with supper. 90 tablet 1   No current facility-administered medications for this visit.     Allergies:   Advair diskus [fluticasone-salmeterol]; Aspirin; Dulera [mometasone furo-formoterol fum]; Ivp dye [iodinated diagnostic agents]; Losartan potassium; Amitriptyline; Amlodipine besylate; Azithromycin; Codeine; Morphine and related; Other; Penicillins; Prevacid  [lansoprazole]; Symbicort [budesonide-formoterol fumarate]; and Tramadol   Social History:  The patient  reports that she quit smoking about 48 years ago. Her smoking use included Cigarettes and Pipe. She has a 20.00 pack-year smoking history. She has never used smokeless tobacco. She reports that she drinks alcohol. She reports that she does not use drugs.   Family History:  The patient's family history includes Diabetes in her brother, daughter, and father; Heart disease in her mother; Other in her sister; Rheumatic fever in her sister.  ROS:  Please see the history of present illness.    All other systems are reviewed and otherwise negative.   PHYSICAL EXAM:  VS:  BP (!) 164/80   Pulse 70   Ht 5\' 4"  (1.626 m)   Wt 174 lb (78.9 kg)   BMI 29.87 kg/m  BMI: Body mass index is 29.87 kg/m. Well nourished, well developed, in no acute distress  HEENT: normocephalic, atraumatic  Neck: no JVD, carotid bruits or masses Cardiac: RRR; no significant murmurs, no rubs, or gallops Lungs:  clear to auscultation bilaterally, no wheezing, rhonchi or rales  Abd: soft, nontender MS: no deformity or atrophy Ext: no edema  Skin: warm and dry, no rash Neuro:  No gross deficits appreciated Psych: euthymic mood, full affect  ILR site is stable, no tethering or discomfort   EKG:  02/29/16 SR, 1st degree AVblock, 75bpm, PR , QRS 98ms ILR interrogation today and reviewed by myself: SR today, R wave 0.42mV, the only events since her last transmission were her 2  symptom episodes and are both SR, and AF episode 02/28/16 (prior to DCCV)  Echo 04/20/15 Mild LVH, EF 55-60%, mild aortic stenosis (peak 10 mmHg), mild AI, mild MR, mild LAE  Echo 2/16 (at [facility]) EF 60-65%, impaired LV filling, no RWMA, normal RVSF, mod Pul HTN, RVSP 50-55 mmHg, mild to mod LAE, mild AI  Records report a normal Cardiolite study in 2000  Recent Labs: 04/13/2015: TSH 2.79 01/08/2016: Brain Natriuretic Peptide 207.7 02/15/2016: BUN 15; Creatinine, Ser 0.78; Platelets 220 02/29/2016: Hemoglobin 12.9; Potassium 4.1; Sodium 137  No results found for requested labs within last 8760 hours.   CrCl cannot be calculated (Patient's most recent lab result is older than the maximum 21 days allowed.).   Wt Readings from Last 3 Encounters:  03/08/16 174 lb (78.9 kg)  02/29/16 177 lb (80.3 kg)  02/15/16 177 lb (  80.3 kg)     Other studies reviewed: Additional studies/records reviewed today include: summarized above  ASSESSMENT AND PLAN:  1. persistent Afib     CHA2DS2Vasc is 3, on Xarelto     S/p DCCV     02/15/16: Creat 0.78, (clearance is 79), H/H 12/38     02/16/16: Creat 0.82 (clearance 75), H/H 12/36     Initially seem unaware of her AF though over time developed exertional intolerance and clearly feeling much better in SR        2. HTN     High here today, historically has been better     Advised to minimize her sodium intake and monitor her BP at home  3. P.HTN     Dr. Kendrick FriesMcQuaid  4. Chronic CHF (diastolic)     Weight is down 4 pounds, exam does not suggest fluid OL       Disposition: F/u with Dr. Johney FrameAllred in 3 months given new to a/c, sooner if needed.  Current medicines are reviewed at length with the patient today.  The patient did not have any concerns regarding medicines.  Judith BlonderSigned, Shi Blankenship Ursy, PA-C 03/08/2016 1:49 PM     [facility] [address] [place] [address] [phone] (office)  [phone]  (fax)

## 2016-03-08 ENCOUNTER — Ambulatory Visit (INDEPENDENT_AMBULATORY_CARE_PROVIDER_SITE_OTHER): Payer: Medicare PPO | Admitting: Physician Assistant

## 2016-03-08 VITALS — BP 164/80 | HR 70 | Ht 64.0 in | Wt 174.0 lb

## 2016-03-08 DIAGNOSIS — I4819 Other persistent atrial fibrillation: Secondary | ICD-10-CM

## 2016-03-08 DIAGNOSIS — I5032 Chronic diastolic (congestive) heart failure: Secondary | ICD-10-CM

## 2016-03-08 DIAGNOSIS — I481 Persistent atrial fibrillation: Secondary | ICD-10-CM

## 2016-03-08 DIAGNOSIS — I10 Essential (primary) hypertension: Secondary | ICD-10-CM

## 2016-03-08 NOTE — Patient Instructions (Signed)
Medication Instructions:   Your physician recommends that you continue on your current medications as directed. Please refer to the Current Medication list given to you today.   If you need a refill on your cardiac medications before your next appointment, please call your pharmacy.  Labwork: NONE ORDERED  TODAY    Testing/Procedures: NONE ORDERED  TODAY    Follow-Up: in 3 MONTHS WITH RENEE URSUY    Any Other Special Instructions Will Be Listed Below (If Applicable).

## 2016-03-15 ENCOUNTER — Ambulatory Visit (INDEPENDENT_AMBULATORY_CARE_PROVIDER_SITE_OTHER): Payer: Medicare PPO | Admitting: *Deleted

## 2016-03-15 DIAGNOSIS — R002 Palpitations: Secondary | ICD-10-CM

## 2016-03-15 NOTE — Progress Notes (Signed)
Carelink Summary Report / Loop Recorder 

## 2016-03-19 LAB — CUP PACEART REMOTE DEVICE CHECK
Date Time Interrogation Session: 20180121053831
MDC IDC PG IMPLANT DT: 20160629

## 2016-03-21 ENCOUNTER — Telehealth: Payer: Self-pay | Admitting: Internal Medicine

## 2016-03-21 NOTE — Telephone Encounter (Signed)
[facility].  Rhythm was NSR with PAC's, no afib.  Spoke with patient and let her know and she will keep her follow up for 03/28/16 as scheduled

## 2016-03-21 NOTE — Telephone Encounter (Signed)
New Message     Had cardio aversion Feb 5, and her heart is acting crazy, skipping several beats, and then she says its racing, feels like she is going to pass out , having a lot of pain in her left arm   Sent pt to triage

## 2016-03-21 NOTE — Telephone Encounter (Signed)
I spoke with the pt and she woke up this morning with complaints of irregular pulse.  The pt said her irregular pulse is fast and slow and she feels like she is going to faint at times. The pt took her metoprolol this morning and she also took 1/4 tablet of klonopin. The pt does not have a monitor at home to check her BP or pulse.  I discussed this pt with Dr Johney FrameAllred and he advised that the pt should send an ILR report to our office for review. I contacted the pt and advised her of instructions.  Device clinic was made aware that transmission will be sent for MD to review.

## 2016-03-28 ENCOUNTER — Encounter: Payer: Self-pay | Admitting: Internal Medicine

## 2016-03-28 ENCOUNTER — Ambulatory Visit (INDEPENDENT_AMBULATORY_CARE_PROVIDER_SITE_OTHER): Payer: Medicare PPO | Admitting: Internal Medicine

## 2016-03-28 VITALS — BP 138/78 | HR 75 | Ht 62.0 in | Wt 176.0 lb

## 2016-03-28 DIAGNOSIS — I10 Essential (primary) hypertension: Secondary | ICD-10-CM

## 2016-03-28 DIAGNOSIS — I481 Persistent atrial fibrillation: Secondary | ICD-10-CM | POA: Diagnosis not present

## 2016-03-28 DIAGNOSIS — I480 Paroxysmal atrial fibrillation: Secondary | ICD-10-CM | POA: Diagnosis not present

## 2016-03-28 DIAGNOSIS — I4819 Other persistent atrial fibrillation: Secondary | ICD-10-CM

## 2016-03-28 LAB — CUP PACEART INCLINIC DEVICE CHECK
Implantable Pulse Generator Implant Date: 20160629
MDC IDC SESS DTM: 20180305140035

## 2016-03-28 NOTE — Progress Notes (Signed)
Electrophysiology Office Note   Date:  03/28/2016   ID:  Kari [name], DOB July 11, 1928, MRN [id number]  Primary Electrophysiologist: [name], MD    CC: edema   History of Present Illness: Kari [name] is a 83 y.o. female who presents today for electrophysiology follow-up.    She feels "so much better" in sinus rhythm.  SOB and fatigue are resolved.  She feels back to baseline.  Edema has returned to baseline.  She states "Ive always had big ankles" Today, she denies symptoms of orthopnea, PND, claudication, dizziness, presyncope, syncope, bleeding, or neurologic sequela. The patient is tolerating medications without difficulties and is otherwise without complaint today.    Past Medical History:  Diagnosis Date  . Abdominal pain   . Angina at rest [facility])   . Arthritis   . Asthma   . Atrial fibrillation (HCC)   . Complication of anesthesia    BP drops  . COPD (chronic obstructive pulmonary disease) (HCC)   . Diverticulosis   . GERD (gastroesophageal reflux disease)   . Helicobacter pylori gastritis 11/2011  . Hiatal hernia   . History of echocardiogram    Echo 3/17: Mild LVH, EF 55-60%, mild aortic stenosis (peak 10 mmHg), mild MR, mild LAE  . History of gallstones   . History of scarlet fever   . MI, old   . Osteoporosis   . Palpitations   . Premature atrial contractions   . Sarcoid (HCC)    pulmonary and dermatologic  . Sarcoidosis, lung (HCC)   . Thyroid disease   . Zenker's diverticulum    Past Surgical History:  Procedure Laterality Date  . ABDOMINAL HYSTERECTOMY  10/1997  . cardiolite  2000  . CARDIOVERSION N/A 02/29/2016   Procedure: CARDIOVERSION;  Surgeon: Jake Bathe, MD;  Location: [facility] ENDOSCOPY;  Service: Cardiovascular;  Laterality: N/A;  . CATARACT EXTRACTION Bilateral   . CHOLECYSTECTOMY  04/1996  . COLONOSCOPY    . EP IMPLANTABLE DEVICE N/A 07/23/2014   Procedure: Loop Recorder Insertion;  Surgeon: [name], MD;  [facility]: [facility] INVASIVE CV  LAB;  Service: Cardiovascular;  Laterality: N/A;  . EYE SURGERY Right 11/2015  . HERNIA REPAIR     inguinal and hiatal/abdominal  . LEG SURGERY     right leg...rod/screws  . THYROIDECTOMY       Current Outpatient Prescriptions  Medication Sig Dispense Refill  . albuterol (PROVENTIL HFA;VENTOLIN HFA) 108 (90 BASE) MCG/ACT inhaler Inhale 1 puff into the lungs every 6 (six) hours as needed for wheezing or shortness of breath.     . Calcium Carbonate-Vitamin D (CALTRATE 600+D) 600-400 MG-UNIT per tablet Take 1 tablet by mouth daily.    . Cholecalciferol 2000 UNITS TABS Take 2,000 Units by mouth daily.    . clonazePAM (KLONOPIN) 0.5 MG tablet Take 0.5 mg by mouth daily as needed for anxiety.   0  . Fluticasone-Salmeterol (ADVAIR HFA IN) Inhale 1 puff into the lungs daily.    . furosemide (LASIX) 40 MG tablet Take 20 mg by mouth daily.    Marland Kitchen levothyroxine (SYNTHROID, LEVOTHROID) 112 MCG tablet Take 112 mcg by mouth daily before breakfast.    . metoprolol succinate (TOPROL-XL) 25 MG 24 hr tablet Take 25 mg by mouth daily.    . multivitamin (THERAGRAN) per tablet Take 1 tablet by mouth daily.    . potassium chloride SA (K-DUR,KLOR-CON) 20 MEQ tablet Take 1 tablet (20 mEq total) by mouth daily. 30 tablet 9  . ranitidine (ZANTAC) 300 MG  tablet Take 1 tablet by mouth at bedtime.    . rivaroxaban (XARELTO) 20 MG TABS tablet Take 1 tablet (20 mg total) by mouth daily with supper. 90 tablet 1  . SPIRIVA HANDIHALER 18 MCG inhalation capsule Place 18 mcg into inhaler and inhale daily.     No current facility-administered medications for this visit.     Allergies:   Advair diskus [fluticasone-salmeterol]; Aspirin; Dulera [mometasone furo-formoterol fum]; Ivp dye [iodinated diagnostic agents]; Losartan potassium; Amitriptyline; Amlodipine besylate; Azithromycin; Codeine; Morphine and related; Other; Penicillins; Prevacid  [lansoprazole]; Symbicort [budesonide-formoterol fumarate]; Tramadol; and Morphine    Social History:  The patient  reports that she quit smoking about 48 years ago. Her smoking use included Cigarettes and Pipe. She has a 20.00 pack-year smoking history. She has never used smokeless tobacco. She reports that she drinks alcohol. She reports that she does not use drugs.   Family History:  The patient's  family history includes Diabetes in her brother, daughter, and father; Heart disease in her mother; Other in her sister; Rheumatic fever in her sister.    ROS:  Please see the history of present illness.   All other systems are reviewed and negative.    PHYSICAL EXAM: VS:  BP 138/78   Pulse 75   Ht 5\' 2"  (1.575 m)   Wt 176 lb (79.8 kg)   SpO2 98%   BMI 32.19 kg/m  , BMI Body mass index is 32.19 kg/m. GEN: Well nourished, well developed, in no acute distress  HEENT: normal  Neck: no JVD, no carotid bruits, or masses Cardiac: RRR; no murmurs, rubs, or gallops,trace edema  Respiratory:  Bibasilar rales, normal work of breathing GI: soft, nontender, nondistended, + BS MS: no deformity or atrophy  Skin: warm and dry  Neuro:  Strength and sensation are intact Psych: euthymic mood, full affect  ILR monitor  is personally reviewed and reveals sinus rhythm since cardioversion,  Rare PVCs ekg today is personally reviewed and shows sinus rhythm 75 bpm, PR 268 msec, LAD   Recent Labs: 04/13/2015: TSH 2.79 01/08/2016: Brain Natriuretic Peptide 207.7 02/15/2016: BUN 15; Creatinine, Ser 0.78; Platelets 220 02/29/2016: Hemoglobin 12.9; Potassium 4.1; Sodium 137    Lipid Panel     Component Value Date/Time   TRIG 62 02/28/2013 0132     Wt Readings from Last 3 Encounters:  03/28/16 176 lb (79.8 kg)  03/08/16 174 lb (78.9 kg)  02/29/16 177 lb (80.3 kg)     ASSESSMENT AND PLAN:  1.  Persistent afib Clinically much improved with sinus rhythm Maintaining sinus off of AAD therapy If her afib returns, would consider AADs.  Given prolonged AV conduction, tikosyn or  amiodarone would be our best options. chads2vasc score is 4.  She is on xarelto.  2. Pulmonary hypertension Likely due to chronic lung disease Followed by Dr Kendrick FriesMcQuaid  3. HTN, edema Sodium restriction advised She has venous insufficiency but does not want to wear support hose improved  Return to see me in 3 months carelink  Signed, Hillis RangeJames Samaiyah Howes, MD  03/28/2016 12:09 PM     [facility] [address] [place] [address] [phone] (office) [phone] (fax)

## 2016-03-28 NOTE — Patient Instructions (Signed)

## 2016-04-01 ENCOUNTER — Telehealth: Payer: Self-pay | Admitting: Internal Medicine

## 2016-04-01 NOTE — Telephone Encounter (Signed)
New message  Pt call requesting to speak with RN. Pt would like to get a recommendation from Dr. Johney FrameAllred  for a surgeon for a hernia. Please call back to discuss
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
## 2016-04-04 NOTE — Telephone Encounter (Signed)
Tresa EndoKelly, Please follow-up with patient

## 2016-04-05 LAB — CUP PACEART REMOTE DEVICE CHECK
Date Time Interrogation Session: 20180220054128
MDC IDC PG IMPLANT DT: 20160629

## 2016-04-06 NOTE — Telephone Encounter (Signed)
She saw her PCP on Fri and he pushed it back in and has been okay since then.  She is going to wait and will let me know if she needs to have surgery.  We would refer her to [facility] if needed

## 2016-04-14 ENCOUNTER — Ambulatory Visit (INDEPENDENT_AMBULATORY_CARE_PROVIDER_SITE_OTHER): Payer: Medicare PPO | Admitting: *Deleted

## 2016-04-14 DIAGNOSIS — R002 Palpitations: Secondary | ICD-10-CM | POA: Diagnosis not present

## 2016-04-14 NOTE — Progress Notes (Signed)
Carelink Summary Report / Loop Recorder 

## 2016-04-27 LAB — CUP PACEART REMOTE DEVICE CHECK
Implantable Pulse Generator Implant Date: 20160629
MDC IDC SESS DTM: 20180322060631

## 2016-05-16 ENCOUNTER — Ambulatory Visit (INDEPENDENT_AMBULATORY_CARE_PROVIDER_SITE_OTHER): Payer: Medicare PPO | Admitting: *Deleted

## 2016-05-16 DIAGNOSIS — R002 Palpitations: Secondary | ICD-10-CM | POA: Diagnosis not present

## 2016-05-16 NOTE — Progress Notes (Signed)
Carelink Summary Report / Loop Recorder 

## 2016-06-03 LAB — CUP PACEART REMOTE DEVICE CHECK
Implantable Pulse Generator Implant Date: 20160629
MDC IDC SESS DTM: 20180421063654

## 2016-06-13 ENCOUNTER — Ambulatory Visit (INDEPENDENT_AMBULATORY_CARE_PROVIDER_SITE_OTHER): Payer: Medicare PPO | Admitting: *Deleted

## 2016-06-13 DIAGNOSIS — R002 Palpitations: Secondary | ICD-10-CM | POA: Diagnosis not present

## 2016-06-13 NOTE — Progress Notes (Signed)
Carelink Summary Report / Loop Recorder 

## 2016-06-16 LAB — CUP PACEART REMOTE DEVICE CHECK
Implantable Pulse Generator Implant Date: 20160629
MDC IDC SESS DTM: 20180521073717

## 2016-06-29 ENCOUNTER — Encounter: Payer: Self-pay | Admitting: Internal Medicine

## 2016-06-29 ENCOUNTER — Encounter (INDEPENDENT_AMBULATORY_CARE_PROVIDER_SITE_OTHER): Payer: Self-pay

## 2016-06-29 ENCOUNTER — Ambulatory Visit (INDEPENDENT_AMBULATORY_CARE_PROVIDER_SITE_OTHER): Payer: Medicare PPO | Admitting: Internal Medicine

## 2016-06-29 VITALS — BP 122/60 | HR 59 | Ht 62.0 in | Wt 173.4 lb

## 2016-06-29 DIAGNOSIS — I10 Essential (primary) hypertension: Secondary | ICD-10-CM | POA: Diagnosis not present

## 2016-06-29 DIAGNOSIS — I481 Persistent atrial fibrillation: Secondary | ICD-10-CM

## 2016-06-29 DIAGNOSIS — I4819 Other persistent atrial fibrillation: Secondary | ICD-10-CM

## 2016-06-29 MED ORDER — METOPROLOL SUCCINATE ER 25 MG PO TB24: 12.5000 mg | ORAL_TABLET | Freq: Every day | ORAL | Status: DC

## 2016-06-29 NOTE — Progress Notes (Signed)
PCP: Marguerite Oleaharles Minton MD  Kari [name] is a 83 y.o. female who presents today for routine electrophysiology followup.  Since last being seen in our clinic, the patient reports doing very well.  Her SOB/ CHF have resolved with sinus rhythm. She has mild fatigue which she attributes to metoprolol. Today, she denies symptoms of palpitations, chest pain, shortness of breath,  lower extremity edema, dizziness, presyncope, or syncope.  The patient is otherwise without complaint today.   Past Medical History:  Diagnosis Date  . Abdominal pain   . Angina at rest [facility](HCC)   . Arthritis   . Asthma   . Atrial fibrillation (HCC)   . Complication of anesthesia    BP drops  . COPD (chronic obstructive pulmonary disease) (HCC)   . Diverticulosis   . GERD (gastroesophageal reflux disease)   . Helicobacter pylori gastritis 11/2011  . Hiatal hernia   . History of echocardiogram    Echo 3/17: Mild LVH, EF 55-60%, mild aortic stenosis (peak 10 mmHg), mild MR, mild LAE  . History of gallstones   . History of scarlet fever   . MI, old   . Osteoporosis   . Palpitations   . Premature atrial contractions   . Sarcoid    pulmonary and dermatologic  . Sarcoidosis, lung (HCC)   . Thyroid disease   . Zenker's diverticulum    Past Surgical History:  Procedure Laterality Date  . ABDOMINAL HYSTERECTOMY  10/1997  . cardiolite  2000  . CARDIOVERSION N/A 02/29/2016   Procedure: CARDIOVERSION;  Surgeon: Jake BatheMark C Skains, MD;  [facility];  Service: Cardiovascular;  Laterality: N/A;  . CATARACT EXTRACTION Bilateral   . CHOLECYSTECTOMY  04/1996  . COLONOSCOPY    . EP IMPLANTABLE DEVICE N/A 07/23/2014   Procedure: Loop Recorder Insertion;  Surgeon: Hillis RangeJames Detravion Tester, MD;  Location: [facility] INVASIVE CV LAB;  Service: Cardiovascular;  Laterality: N/A;  . EYE SURGERY Right 11/2015  . HERNIA REPAIR     inguinal and hiatal/abdominal  . LEG SURGERY     right leg...rod/screws  . THYROIDECTOMY      ROS- all systems  are reviewed and negatives except as per HPI above  Current Outpatient Prescriptions  Medication Sig Dispense Refill  . albuterol (PROVENTIL HFA;VENTOLIN HFA) 108 (90 BASE) MCG/ACT inhaler Inhale 1 puff into the lungs every 6 (six) hours as needed for wheezing or shortness of breath.     . Calcium Carbonate-Vitamin D (CALTRATE 600+D) 600-400 MG-UNIT per tablet Take 1 tablet by mouth daily.    . Cholecalciferol 2000 UNITS TABS Take 2,000 Units by mouth daily.    . clonazePAM (KLONOPIN) 0.5 MG tablet Take 0.5 mg by mouth daily as needed for anxiety.   0  . furosemide (LASIX) 20 MG tablet Take 20 mg by mouth daily.    Marland Kitchen. levothyroxine (SYNTHROID, LEVOTHROID) 112 MCG tablet Take 112 mcg by mouth daily before breakfast.    . metoprolol succinate (TOPROL-XL) 25 MG 24 hr tablet Take 25 mg by mouth daily.    . multivitamin (THERAGRAN) per tablet Take 1 tablet by mouth daily.    . potassium chloride SA (K-DUR,KLOR-CON) 20 MEQ tablet Take 1 tablet (20 mEq total) by mouth daily. 30 tablet 9  . ranitidine (ZANTAC) 300 MG tablet Take 1 tablet by mouth at bedtime.    . rivaroxaban (XARELTO) 20 MG TABS tablet Take 1 tablet (20 mg total) by mouth daily with supper. 90 tablet 1  . SPIRIVA HANDIHALER 18 MCG inhalation capsule  Place 18 mcg into inhaler and inhale daily.     No current facility-administered medications for this visit.     Physical Exam: Vitals:   06/29/16 1228  BP: 122/60  Pulse: (!) 59  SpO2: 97%  Weight: 173 lb 6.4 oz (78.7 kg)  Height: 5\' 2"  (1.575 m)    GEN- The patient is well appearing, alert and oriented x 3 today.   Head- normocephalic, atraumatic Eyes-  Sclera clear, conjunctiva pink Ears- hearing intact Oropharynx- clear Lungs- Clear to ausculation bilaterally, normal work of breathing Heart- Regular rate and rhythm, no murmurs, rubs or gallops, PMI not laterally displaced GI- soft, NT, ND, + BS Extremities- no clubbing, cyanosis, or edema  ILR interrogation ordered  today is personally reviewed and shows sinus rhythm, there is 0.6% afib burden noted however this appears to be sinus with PACs.  I do not see any afib episodes  Assessment and Plan:  1. Persistent afib Maintaining sinus rhythm off of AAD therapy Reduce toprol XL to 12.5mg  daily per her request.  She could possibly stop metoprolol in the future Continue xarelto Cbc and bmet today  2. Pulmonary hypertension Followed by Dr Kendrick Fries  3. HTN Stable No change required today  [name] MD, [facility] 06/29/2016 12:39 PM

## 2016-06-29 NOTE — Patient Instructions (Addendum)
Medication Instructions:  Your physician has recommended you make the following change in your medication:  1) Decrease dosage  METOPROLOL to 12.5 mg daily.  Labwork: Your physician recommends that you return for lab work today for BMET, CBC  Testing/Procedures: None ordered   Follow-Up: Your physician wants you to follow-up in: 6 months with Dr. Johney FrameAllred. You will receive a reminder letter in the mail two months in advance. If you don't receive a letter, please call our office to schedule the follow-up appointment.  Remote monitoring is used to monitor your Pacemaker from home. This monitoring reduces the number of office visits required to check your device to one time per year. It allows us to keep an eye on the functioning of your device to ensure it is working properly. You are scheduled for a device check from home on 07/13/16. You may send your transmission at any time that day. If you have a wireless device, the transmission will be sent automatically. After your physician reviews your transmission, you will receive a postcard with your next transmission date.

## 2016-06-29 NOTE — Addendum Note (Signed)
Addended by: Dennis BastLANIER, Nayleen Janosik F on: 06/29/2016 12:53 PM   Modules accepted: Orders

## 2016-06-30 LAB — CBC WITH DIFFERENTIAL/PLATELET
Basophils Absolute: 0 10*3/uL (ref 0.0–0.2)
Basos: 1 %
EOS (ABSOLUTE): 0.1 10*3/uL (ref 0.0–0.4)
EOS: 1 %
HEMATOCRIT: 37.6 % (ref 34.0–46.6)
HEMOGLOBIN: 12.6 g/dL (ref 11.1–15.9)
IMMATURE GRANS (ABS): 0 10*3/uL (ref 0.0–0.1)
IMMATURE GRANULOCYTES: 0 %
LYMPHS ABS: 1.2 10*3/uL (ref 0.7–3.1)
LYMPHS: 25 %
MCH: 31.2 pg (ref 26.6–33.0)
MCHC: 33.5 g/dL (ref 31.5–35.7)
MCV: 93 fL (ref 79–97)
MONOCYTES: 12 %
Monocytes Absolute: 0.6 10*3/uL (ref 0.1–0.9)
NEUTROS PCT: 61 %
Neutrophils Absolute: 3 10*3/uL (ref 1.4–7.0)
Platelets: 256 10*3/uL (ref 150–379)
RBC: 4.04 x10E6/uL (ref 3.77–5.28)
RDW: 13.7 % (ref 12.3–15.4)
WBC: 4.9 10*3/uL (ref 3.4–10.8)

## 2016-06-30 LAB — BASIC METABOLIC PANEL
BUN / CREAT RATIO: 25 (ref 12–28)
BUN: 18 mg/dL (ref 8–27)
CO2: 26 mmol/L (ref 18–29)
Calcium: 9.4 mg/dL (ref 8.7–10.3)
Chloride: 92 mmol/L — ABNORMAL LOW (ref 96–106)
Creatinine, Ser: 0.71 mg/dL (ref 0.57–1.00)
GFR calc Af Amer: 89 mL/min/{1.73_m2} (ref >59)
GFR calc non Af Amer: 77 mL/min/{1.73_m2} (ref >59)
GLUCOSE: 90 mg/dL (ref 65–99)
Potassium: 4.5 mmol/L (ref 3.5–5.2)
Sodium: 132 mmol/L — ABNORMAL LOW (ref 134–144)

## 2016-07-01 LAB — CUP PACEART INCLINIC DEVICE CHECK
Implantable Pulse Generator Implant Date: 20160629
MDC IDC SESS DTM: 20180305164848

## 2016-07-13 ENCOUNTER — Ambulatory Visit (INDEPENDENT_AMBULATORY_CARE_PROVIDER_SITE_OTHER): Payer: Medicare PPO | Admitting: *Deleted

## 2016-07-13 DIAGNOSIS — R002 Palpitations: Secondary | ICD-10-CM | POA: Diagnosis not present

## 2016-07-14 NOTE — Progress Notes (Signed)
Carelink Summary Report / Loop Recorder 

## 2016-07-20 LAB — CUP PACEART REMOTE DEVICE CHECK
Date Time Interrogation Session: 20180620073620
MDC IDC PG IMPLANT DT: 20160629

## 2016-08-12 ENCOUNTER — Ambulatory Visit (INDEPENDENT_AMBULATORY_CARE_PROVIDER_SITE_OTHER): Payer: Medicare PPO | Admitting: *Deleted

## 2016-08-12 DIAGNOSIS — R002 Palpitations: Secondary | ICD-10-CM | POA: Diagnosis not present

## 2016-08-16 NOTE — Progress Notes (Signed)
Carelink Summary Report / Loop Recorder 

## 2016-08-22 ENCOUNTER — Other Ambulatory Visit: Payer: Self-pay | Admitting: Internal Medicine

## 2016-08-28 LAB — CUP PACEART REMOTE DEVICE CHECK
Date Time Interrogation Session: 20180720174148
MDC IDC PG IMPLANT DT: 20160629

## 2016-08-28 NOTE — Progress Notes (Signed)
Carelink summary report received. Battery status OK. Normal device function. No new symptom episodes, tachy episodes, brady, or pause episodes. 11 AF 1.2% +Xarelto. Monthly summary reports and ROV/PRN

## 2016-09-06 ENCOUNTER — Other Ambulatory Visit: Payer: Self-pay | Admitting: Internal Medicine

## 2016-09-07 NOTE — Telephone Encounter (Signed)
Pt saw Dr Johney FrameAllred on 06/29/16 for OV. Weight at that time 78.7Kg. Age 89yrs old. Creatineclearance 70.3966mL/min. Will refill Xarelto 20mg  QD

## 2016-09-12 ENCOUNTER — Ambulatory Visit (INDEPENDENT_AMBULATORY_CARE_PROVIDER_SITE_OTHER): Payer: Medicare PPO | Admitting: *Deleted

## 2016-09-12 DIAGNOSIS — R002 Palpitations: Secondary | ICD-10-CM

## 2016-09-13 NOTE — Progress Notes (Signed)
Carelink Summary Report / Loop Recorder 

## 2016-09-18 LAB — CUP PACEART REMOTE DEVICE CHECK
Date Time Interrogation Session: 20180819180945
MDC IDC PG IMPLANT DT: 20160629

## 2016-10-03 ENCOUNTER — Telehealth: Payer: Self-pay | Admitting: Internal Medicine

## 2016-10-03 NOTE — Telephone Encounter (Signed)
Pt would like a nurse call regarding afib/swelling of feet, legs and some in her stomach-pls advise [phone]

## 2016-10-03 NOTE — Telephone Encounter (Addendum)
Having a lot of problems with asthma medication and [place] doctor in [place].  She is on her way to see her PCP now to see if they can adjust her medications.  She is retaining some fluid and feels she may be going in and out of afib at times.  I let her know I would call her back later today to see what her PCP thought and the plan.  She was appreciative of my call.

## 2016-10-10 NOTE — Telephone Encounter (Signed)
Received EKG's and labs from PCP.  Discussed with Dr Johney Frame and he will see her 10/14/16 at 9:15am.  Patient aware and appreciative of call.
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
## 2016-10-10 NOTE — Telephone Encounter (Signed)
Follow up     Per pt her PCP put her on a  Fluid pill and Entresto, she wants to know what she is to do about the AFIB.  She thought you were going to call her last week

## 2016-10-11 ENCOUNTER — Ambulatory Visit (INDEPENDENT_AMBULATORY_CARE_PROVIDER_SITE_OTHER): Payer: Medicare PPO | Admitting: *Deleted

## 2016-10-11 DIAGNOSIS — R002 Palpitations: Secondary | ICD-10-CM

## 2016-10-12 NOTE — Progress Notes (Signed)
Carelink Summary Report / Loop Recorder 

## 2016-10-13 LAB — CUP PACEART REMOTE DEVICE CHECK
Date Time Interrogation Session: 20180918184116
Implantable Pulse Generator Implant Date: 20160629

## 2016-10-14 ENCOUNTER — Encounter: Payer: Self-pay | Admitting: Internal Medicine

## 2016-10-14 ENCOUNTER — Ambulatory Visit (INDEPENDENT_AMBULATORY_CARE_PROVIDER_SITE_OTHER): Payer: Medicare PPO | Admitting: Internal Medicine

## 2016-10-14 VITALS — BP 128/64 | HR 85 | Ht 63.0 in | Wt 179.6 lb

## 2016-10-14 DIAGNOSIS — I481 Persistent atrial fibrillation: Secondary | ICD-10-CM

## 2016-10-14 DIAGNOSIS — I10 Essential (primary) hypertension: Secondary | ICD-10-CM | POA: Diagnosis not present

## 2016-10-14 DIAGNOSIS — R0602 Shortness of breath: Secondary | ICD-10-CM

## 2016-10-14 DIAGNOSIS — I5031 Acute diastolic (congestive) heart failure: Secondary | ICD-10-CM

## 2016-10-14 DIAGNOSIS — I4819 Other persistent atrial fibrillation: Secondary | ICD-10-CM

## 2016-10-14 LAB — CBC WITH DIFFERENTIAL/PLATELET
Basophils Absolute: 0 10*3/uL (ref 0.0–0.2)
Basos: 1 %
EOS (ABSOLUTE): 0.2 10*3/uL (ref 0.0–0.4)
EOS: 4 %
HEMATOCRIT: 37.1 % (ref 34.0–46.6)
HEMOGLOBIN: 12.9 g/dL (ref 11.1–15.9)
IMMATURE GRANS (ABS): 0 10*3/uL (ref 0.0–0.1)
Immature Granulocytes: 0 %
LYMPHS ABS: 1 10*3/uL (ref 0.7–3.1)
Lymphs: 18 %
MCH: 32.7 pg (ref 26.6–33.0)
MCHC: 34.8 g/dL (ref 31.5–35.7)
MCV: 94 fL (ref 79–97)
MONOCYTES: 13 %
Monocytes Absolute: 0.7 10*3/uL (ref 0.1–0.9)
NEUTROS ABS: 3.7 10*3/uL (ref 1.4–7.0)
Neutrophils: 64 %
Platelets: 273 10*3/uL (ref 150–379)
RBC: 3.95 x10E6/uL (ref 3.77–5.28)
RDW: 12.2 % — ABNORMAL LOW (ref 12.3–15.4)
WBC: 5.7 10*3/uL (ref 3.4–10.8)

## 2016-10-14 LAB — CUP PACEART INCLINIC DEVICE CHECK
Implantable Pulse Generator Implant Date: 20160629
MDC IDC SESS DTM: 20180921110706

## 2016-10-14 LAB — BASIC METABOLIC PANEL
BUN / CREAT RATIO: 13 (ref 12–28)
BUN: 11 mg/dL (ref 8–27)
CO2: 24 mmol/L (ref 20–29)
CREATININE: 0.84 mg/dL (ref 0.57–1.00)
Calcium: 9.6 mg/dL (ref 8.7–10.3)
Chloride: 94 mmol/L — ABNORMAL LOW (ref 96–106)
GFR calc non Af Amer: 63 mL/min/{1.73_m2} (ref >59)
GFR, EST AFRICAN AMERICAN: 73 mL/min/{1.73_m2} (ref >59)
Glucose: 97 mg/dL (ref 65–99)
Potassium: 4.8 mmol/L (ref 3.5–5.2)
Sodium: 132 mmol/L — ABNORMAL LOW (ref 134–144)
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
## 2016-10-14 NOTE — Patient Instructions (Addendum)
Medication Instructions:  Your physician recommends that you continue on your current medications as directed. Please refer to the Current Medication list given to you today.   Labwork: Your physician recommends that you return for lab work today:   Testing/Procedures: Your physician has recommended that you have a Cardioversion (DCCV). Electrical Cardioversion uses a jolt of electricity to your heart either through paddles or wired patches attached to your chest. This is a controlled, usually prescheduled, procedure. Defibrillation is done under light anesthesia in the hospital, and you usually go home the day of the procedure. This is done to get your heart back into a normal rhythm. You are not awake for the procedure. Please see the instruction sheet given to you today.--10/19/16  Please arrive at [facility] at 11:30am Do not eat or drink after midnight the night prior to the procedure Do not take any medications the morning of the test DO NOT MISS ANY XARELTO Plan for one night stay Will need someone to drive you home at discharge    Your physician has requested that you have an echocardiogram. Echocardiography is a painless test that uses sound waves to create images of your heart. It provides your doctor with information about the size and shape of your heart and how well your heart's chambers and valves are working. This procedure takes approximately one hour. There are no restrictions for this procedure.    Follow-Up: Your physician recommends that you schedule a follow-up appointment in: 4 weeks with Francis Dowse, PA and 3 months with Dr Johney Frame   Any Other Special Instructions Will Be Listed Below (If Applicable).     If you need a refill on your cardiac medications before your next appointment, please call your pharmacy.

## 2016-10-18 ENCOUNTER — Other Ambulatory Visit: Payer: Self-pay | Admitting: Nurse Practitioner

## 2016-10-19 ENCOUNTER — Encounter (HOSPITAL_COMMUNITY)
Admission: RE | Disposition: A | Discharge status: Home / Self Care | Payer: Self-pay | Source: Ambulatory Visit | Attending: Cardiovascular Disease

## 2016-10-19 ENCOUNTER — Encounter (HOSPITAL_COMMUNITY): Payer: Self-pay | Admitting: *Deleted

## 2016-10-19 ENCOUNTER — Ambulatory Visit (HOSPITAL_COMMUNITY): Payer: Medicare PPO | Admitting: Anesthesiology

## 2016-10-19 ENCOUNTER — Ambulatory Visit (HOSPITAL_COMMUNITY)
Admission: RE | Admit: 2016-10-19 | Discharge: 2016-10-19 | Disposition: A | Discharge status: Home / Self Care | Payer: Medicare PPO | Source: Ambulatory Visit | Attending: Cardiovascular Disease | Admitting: Cardiovascular Disease

## 2016-10-19 DIAGNOSIS — K219 Gastro-esophageal reflux disease without esophagitis: Secondary | ICD-10-CM | POA: Insufficient documentation

## 2016-10-19 DIAGNOSIS — I252 Old myocardial infarction: Secondary | ICD-10-CM | POA: Insufficient documentation

## 2016-10-19 DIAGNOSIS — I5021 Acute systolic (congestive) heart failure: Secondary | ICD-10-CM | POA: Insufficient documentation

## 2016-10-19 DIAGNOSIS — I272 Pulmonary hypertension, unspecified: Secondary | ICD-10-CM | POA: Insufficient documentation

## 2016-10-19 DIAGNOSIS — J449 Chronic obstructive pulmonary disease, unspecified: Secondary | ICD-10-CM | POA: Insufficient documentation

## 2016-10-19 DIAGNOSIS — Z87891 Personal history of nicotine dependence: Secondary | ICD-10-CM | POA: Insufficient documentation

## 2016-10-19 DIAGNOSIS — I481 Persistent atrial fibrillation: Secondary | ICD-10-CM | POA: Insufficient documentation

## 2016-10-19 DIAGNOSIS — I110 Hypertensive heart disease with heart failure: Secondary | ICD-10-CM | POA: Insufficient documentation

## 2016-10-19 DIAGNOSIS — Z79899 Other long term (current) drug therapy: Secondary | ICD-10-CM | POA: Insufficient documentation

## 2016-10-19 DIAGNOSIS — Z7901 Long term (current) use of anticoagulants: Secondary | ICD-10-CM | POA: Insufficient documentation

## 2016-10-19 HISTORY — PX: CARDIOVERSION: SHX1299

## 2016-10-19 SURGERY — CARDIOVERSION
Anesthesia: General

## 2016-10-19 MED ORDER — LIDOCAINE HCL (CARDIAC) 20 MG/ML IV SOLN
INTRAVENOUS | Status: DC | PRN
  Administered 2016-10-19: 20 mg via INTRAVENOUS

## 2016-10-19 MED ORDER — SODIUM CHLORIDE 0.9 % IV SOLN
250.0000 mL | INTRAVENOUS | Status: DC
  Administered 2016-10-19: 500 mL via INTRAVENOUS

## 2016-10-19 MED ORDER — PROPOFOL 10 MG/ML IV BOLUS
INTRAVENOUS | Status: DC | PRN
  Administered 2016-10-19: 70 mg via INTRAVENOUS

## 2016-10-19 MED ORDER — SODIUM CHLORIDE 0.9% FLUSH: 3.0000 mL | Freq: Two times a day (BID) | INTRAVENOUS | Status: DC

## 2016-10-19 NOTE — Transfer of Care (Signed)
Immediate Anesthesia Transfer of Care Note  Patient: Kari [name]  Procedure(s) Performed: Procedure(s): CARDIOVERSION (N/A)  Patient Location: Endoscopy Unit  Anesthesia Type:General  Level of Consciousness: awake alert and oriented  Airway & Oxygen Therapy: Patient Spontanous Breathing and Patient connected to nasal cannula oxygen  Post-op Assessment: Report given to RN and Post -op Vital signs reviewed and stable  Post vital signs: Reviewed and stable  Last Vitals:  Vitals:   10/19/16 1148 10/19/16 1154  BP: (!) 152/78   Pulse: 72   Resp: (!) 22   Temp:  36.6 C  SpO2: 98%     Last Pain:  Vitals:   10/19/16 1154  TempSrc: Oral         Complications: No apparent anesthesia complications

## 2016-10-19 NOTE — CV Procedure (Signed)
Electrical Cardioversion Procedure Note Kari [name] [id number] 1928/03/18  Procedure: Electrical Cardioversion Indications:  Atrial Fibrillation  Procedure Details Consent: Risks of procedure as well as the alternatives and risks of each were explained to the (patient/caregiver).  Consent for procedure obtained. Time Out: Verified patient identification, verified procedure, site/side was marked, verified correct patient position, special equipment/implants available, medications/allergies/relevent history reviewed, required imaging and test results available.  Performed  Patient placed on cardiac monitor, pulse oximetry, supplemental oxygen as necessary.  Sedation given: Propofol Pacer pads placed anterior and posterior chest.  Cardioverted 1 time(s).  Cardioverted at 150J.  Evaluation Findings: Post procedure EKG shows: NSR with frequent PACs.  Mobitz I. Complications: None Patient did tolerate procedure well.   Kari Si, MD 10/19/2016, 1:19 PM

## 2016-10-19 NOTE — Anesthesia Postprocedure Evaluation (Signed)
Anesthesia Post Note  Patient: Kari [name]  Procedure(s) Performed: Procedure(s) (LRB): CARDIOVERSION (N/A)     Patient location during evaluation: Endoscopy Anesthesia Type: General Level of consciousness: awake, awake and alert and oriented Pain management: pain level controlled Vital Signs Assessment: post-procedure vital signs reviewed and stable Respiratory status: spontaneous breathing, nonlabored ventilation and respiratory function stable Cardiovascular status: blood pressure returned to baseline Anesthetic complications: no    Last Vitals:  Vitals:   10/19/16 1345 10/19/16 1355  BP: 132/81 133/60  Pulse: 85 87  Resp: (!) 27 (!) 26  Temp:    SpO2: 96% 96%    Last Pain:  Vitals:   10/19/16 1326  TempSrc: Oral                 Milfred Krammes COKER

## 2016-10-19 NOTE — Anesthesia Preprocedure Evaluation (Addendum)
Anesthesia Evaluation  Patient identified by MRN, date of birth, ID band Patient awake    Reviewed: Allergy & Precautions, NPO status , Patient's Chart, lab work & pertinent test results, reviewed documented beta blocker date and time   Airway Mallampati: II  TM Distance: >3 FB Neck ROM: Full    Dental   Pulmonary asthma , COPD,  COPD inhaler, former smoker,    breath sounds clear to auscultation       Cardiovascular hypertension, Pt. on medications and Pt. on home beta blockers + Past MI  + dysrhythmias Atrial Fibrillation  Rhythm:Irregular Rate:Normal  03/2015 : Study Conclusions  - Left ventricle: The cavity size was mildly dilated. Wall   thickness was increased in a pattern of mild LVH. Systolic   function was normal. The estimated ejection fraction was in the  range of 55% to 60%. - Aortic valve: There was mild stenosis. There was mild   regurgitation. Valve area (Vmax): 2.23 cm^2. - Mitral valve: There was mild regurgitation. - Left atrium: The atrium was mildly dilated. - Atrial septum: No defect or patent foramen ovale was identified.   Neuro/Psych negative neurological ROS     GI/Hepatic Neg liver ROS, hiatal hernia, GERD  ,  Endo/Other  negative endocrine ROS  Renal/GU negative Renal ROS     Musculoskeletal  (+) Arthritis ,   Abdominal   Peds  Hematology negative hematology ROS (+)   Anesthesia Other Findings   Reproductive/Obstetrics                             Anesthesia Physical Anesthesia Plan  ASA: III  Anesthesia Plan: General   Post-op Pain Management:    Induction: Intravenous  PONV Risk Score and Plan: 3 and Ondansetron, Propofol infusion and Treatment may vary due to age or medical condition  Airway Management Planned: Natural Airway and Mask  Additional Equipment:   Intra-op Plan:   Post-operative Plan:   Informed Consent: I have reviewed the  patients History and Physical, chart, labs and discussed the procedure including the risks, benefits and alternatives for the proposed anesthesia with the patient or authorized representative who has indicated his/her understanding and acceptance.     Plan Discussed with: CRNA  Anesthesia Plan Comments:        Anesthesia Quick Evaluation

## 2016-10-19 NOTE — H&P (View-Only) (Signed)
Electrophysiology Office Note    Date:  10/14/2016   ID:  Kari [name], DOB 07/03/1928, MRN [id number]  PCP: Marguerite Olea MD Primary Electrophysiologist: [name], MD    CC: afib   History of Present Illness: Kari [name] is a 83 y.o. female who presents today for urgent electrophysiology evaluation.   When I saw her last in June, her CHF had resolved with cardioversion for afib.  Unfortunately, she has returned to Afib in August.  She reports having a traumatic injury when she feel into the doors at [employer] and they closed on her.  She feels that she went into afib at that time.  She reports that she thinks she has been persistently in afib for several weeks.  She reports symptoms of fatigue, worsening SOB, and edema.  She saw her PCP and was started on entresto due to elevated BNP.  Her edema has improved however she remains very weak and SOB.  She is very concerned about this.  Today, she denies symptoms of palpitations, chest pain, claudication, dizziness, presyncope, syncope, bleeding, or neurologic sequela. The patient is tolerating medications without difficulties and is otherwise without complaint today.    Past Medical History:  Diagnosis Date  . Abdominal pain   . Angina at rest [facility])   . Arthritis   . Asthma   . Atrial fibrillation (HCC)   . Complication of anesthesia    BP drops  . COPD (chronic obstructive pulmonary disease) (HCC)   . Diverticulosis   . GERD (gastroesophageal reflux disease)   . Helicobacter pylori gastritis 11/2011  . Hiatal hernia   . History of echocardiogram    Echo 3/17: Mild LVH, EF 55-60%, mild aortic stenosis (peak 10 mmHg), mild MR, mild LAE  . History of gallstones   . History of scarlet fever   . MI, old   . Osteoporosis   . Palpitations   . Premature atrial contractions   . Sarcoid    pulmonary and dermatologic  . Sarcoidosis, lung (HCC)   . Thyroid disease   . Zenker's diverticulum    Past Surgical History:    Procedure Laterality Date  . ABDOMINAL HYSTERECTOMY  10/1997  . cardiolite  2000  . CARDIOVERSION N/A 02/29/2016   Procedure: CARDIOVERSION;  Surgeon: Jake Bathe, MD;  Location: [facility] ENDOSCOPY;  Service: Cardiovascular;  Laterality: N/A;  . CATARACT EXTRACTION Bilateral   . CHOLECYSTECTOMY  04/1996  . COLONOSCOPY    . EP IMPLANTABLE DEVICE N/A 07/23/2014   Procedure: Loop Recorder Insertion;  Surgeon: [name], MD;  Location: [facility] INVASIVE CV LAB;  Service: Cardiovascular;  Laterality: N/A;  . EYE SURGERY Right 11/2015  . HERNIA REPAIR     inguinal and hiatal/abdominal  . LEG SURGERY     right leg...rod/screws  . THYROIDECTOMY       Current Outpatient Prescriptions  Medication Sig Dispense Refill  . albuterol (PROVENTIL HFA;VENTOLIN HFA) 108 (90 BASE) MCG/ACT inhaler Inhale 1 puff into the lungs every 6 (six) hours as needed for wheezing or shortness of breath.     Marland Kitchen BREO ELLIPTA 100-25 MCG/INH AEPB     . Calcium Carbonate-Vitamin D (CALTRATE 600+D) 600-400 MG-UNIT per tablet Take 1 tablet by mouth daily.    . Cholecalciferol 2000 UNITS TABS Take 2,000 Units by mouth daily.    . clonazePAM (KLONOPIN) 0.5 MG tablet Take 0.5 mg by mouth daily as needed for anxiety.   0  . furosemide (LASIX) 20 MG tablet Take 20 mg by  mouth daily.    Marland Kitchen levothyroxine (SYNTHROID, LEVOTHROID) 112 MCG tablet Take 112 mcg by mouth daily before breakfast.    . metoprolol succinate (TOPROL-XL) 25 MG 24 hr tablet Take 0.5 tablets (12.5 mg total) by mouth daily.    . multivitamin (THERAGRAN) per tablet Take 1 tablet by mouth daily.    . potassium chloride SA (K-DUR,KLOR-CON) 20 MEQ tablet Take 1 tablet (20 mEq total) by mouth daily. 30 tablet 9  . ranitidine (ZANTAC) 150 MG tablet TAKE 1 TABLET EVERY MORNING 90 tablet 2  . ranitidine (ZANTAC) 300 MG tablet Take 1 tablet by mouth at bedtime.    Marland Kitchen SPIRIVA HANDIHALER 18 MCG inhalation capsule Place 18 mcg into inhaler and inhale daily.    . VOLTAREN 1 % GEL      . XARELTO 20 MG TABS tablet TAKE 1 TABLET EVERY DAY WITH SUPPER 90 tablet 3   No current facility-administered medications for this visit.     Allergies:   Advair diskus [fluticasone-salmeterol]; Aspirin; Dulera [mometasone furo-formoterol fum]; Ivp dye [iodinated diagnostic agents]; Losartan potassium; Amitriptyline; Codeine; Oxycodone-acetaminophen; Amlodipine besylate; Azithromycin; Morphine and related; Other; Penicillins; Prevacid [lansoprazole]; Symbicort [budesonide-formoterol fumarate]; Tramadol; and Morphine   Social History:  The patient  reports that she quit smoking about 48 years ago. Her smoking use included Cigarettes and Pipe. She has a 20.00 pack-year smoking history. She has never used smokeless tobacco. She reports that she drinks alcohol. She reports that she does not use drugs.   Family History:  The patient's  family history includes Diabetes in her brother, daughter, and father; Heart disease in her mother; Other in her sister; Rheumatic fever in her sister.    ROS:  Please see the history of present illness.   All other systems are personally reviewed and negative.    PHYSICAL EXAM: VS:  There were no vitals taken for this visit. , BMI There is no height or weight on file to calculate BMI. GEN: Well nourished, well developed, in no acute distress  HEENT: normal  Neck: +JVD  Cardiac: iRRR; no murmurs, rubs, or gallops,+ edema  Respiratory:  Bibasilar rales, normal work of breathing GI: soft, nontender, nondistended, + BS MS: no deformity or atrophy  Skin: warm and dry  Neuro:  Strength and sensation are intact Psych: euthymic mood, full affect  EKG:  EKG from PCP is reviewed today   Recent Labs: 01/08/2016: Brain Natriuretic Peptide 207.7 06/29/2016: BUN 18; Creatinine, Ser 0.71; Hemoglobin 12.6; Platelets 256; Potassium 4.5; Sodium 132  personally reviewed   Lipid Panel     Component Value Date/Time   TRIG 62 02/28/2013 0132   personally reviewed    Wt Readings from Last 3 Encounters:  06/29/16 173 lb 6.4 oz (78.7 kg)  03/28/16 176 lb (79.8 kg)  03/08/16 174 lb (78.9 kg)    ILR is reviewed today  Other studies personally reviewed: Additional studies/ records that were reviewed today include: PCPs labs  Review of the above records today demonstrates: as above   ASSESSMENT AND PLAN:  1.  Persistent afib The patient has recurrent symptomatic afib with acute CHF.  She is at risk for further decompensation/ hospitalization.  She is added on urgently to see me today.  Her AF does correlate by loop recorder review with her traumatic event at [facility]. She reports compliance with xarelto without interruption.  I would advise cardioversion at the next available time.  If she has further afib in the future, we may have to consider  AAD therapy at that time.  2. Acute systolic CHF Likely diastolic Will obtain echo upon return once in sinus If her EF is normal, we should stop entresto which is indicated for reduced EF patients Salt restriction advised  3. Pulmonary htn Stable No change required today  4. HTN Stable No change required today  Follow-up:  Cardioversion next week, return to see EP PA in 4 weeks with echo,  Return to see me in 3 months  Current medicines are reviewed at length with the patient today.   The patient does not have concerns regarding her medicines.  The following changes were made today:  none   Signed, [name], MD  10/14/2016 9:44 AM     [facility] [address] [place] [address] [phone] (office) [phone] (fax)

## 2016-10-19 NOTE — Interval H&P Note (Signed)
History and Physical Interval Note:  10/19/2016 12:26 PM  Kari [name]  has presented today for surgery, with the diagnosis of A-FIB  The various methods of treatment have been discussed with the patient and family. After consideration of risks, benefits and other options for treatment, the patient has consented to  Procedure(s): CARDIOVERSION (N/A) as a surgical intervention .  The patient's history has been reviewed, patient examined, no change in status, stable for surgery.  I have reviewed the patient's chart and labs.  Questions were answered to the patient's satisfaction.     [name], MD

## 2016-10-26 ENCOUNTER — Other Ambulatory Visit: Payer: Self-pay

## 2016-10-26 ENCOUNTER — Ambulatory Visit (HOSPITAL_COMMUNITY): Payer: Medicare PPO | Attending: Cardiology

## 2016-10-26 DIAGNOSIS — R0602 Shortness of breath: Secondary | ICD-10-CM | POA: Diagnosis not present

## 2016-10-26 DIAGNOSIS — I4819 Other persistent atrial fibrillation: Secondary | ICD-10-CM

## 2016-10-26 DIAGNOSIS — I481 Persistent atrial fibrillation: Secondary | ICD-10-CM

## 2016-10-26 DIAGNOSIS — I080 Rheumatic disorders of both mitral and aortic valves: Secondary | ICD-10-CM | POA: Insufficient documentation

## 2016-10-26 DIAGNOSIS — I420 Dilated cardiomyopathy: Secondary | ICD-10-CM | POA: Diagnosis not present

## 2016-10-27 ENCOUNTER — Telehealth: Payer: Self-pay | Admitting: *Deleted

## 2016-10-27 NOTE — Telephone Encounter (Signed)
Patient informed. 

## 2016-10-27 NOTE — Telephone Encounter (Signed)
-----   Message from [name], MD sent at 10/26/2016  5:19 PM EDT ----- Results reviewed.  Kelly, please inform pt of result. I will route to primary care also. 

## 2016-11-10 ENCOUNTER — Ambulatory Visit (INDEPENDENT_AMBULATORY_CARE_PROVIDER_SITE_OTHER): Payer: Medicare PPO | Admitting: *Deleted

## 2016-11-10 ENCOUNTER — Ambulatory Visit (INDEPENDENT_AMBULATORY_CARE_PROVIDER_SITE_OTHER): Payer: Medicare PPO | Admitting: Physician Assistant

## 2016-11-10 VITALS — BP 140/80 | HR 82 | Ht 63.0 in | Wt 183.0 lb

## 2016-11-10 DIAGNOSIS — I10 Essential (primary) hypertension: Secondary | ICD-10-CM

## 2016-11-10 DIAGNOSIS — R002 Palpitations: Secondary | ICD-10-CM

## 2016-11-10 DIAGNOSIS — I481 Persistent atrial fibrillation: Secondary | ICD-10-CM | POA: Diagnosis not present

## 2016-11-10 DIAGNOSIS — I5030 Unspecified diastolic (congestive) heart failure: Secondary | ICD-10-CM | POA: Diagnosis not present

## 2016-11-10 DIAGNOSIS — I4819 Other persistent atrial fibrillation: Secondary | ICD-10-CM

## 2016-11-10 NOTE — Patient Instructions (Addendum)
Medication Instructions:   STOP TAKING ENTRESTO    START TAKING LASIX 20 MG ONCE A DAY   If you need a refill on your cardiac medications before your next appointment, please call your pharmacy.  Labwork: NONE ORDERED  TODAY    Testing/Procedures: NONE ORDERED  TODAY    Follow-Up:  IN 2 MONTHS WITH RENEE URSUY    Any Other Special Instructions Will Be Listed Below (If Applicable).

## 2016-11-10 NOTE — Progress Notes (Addendum)
Cardiology Office Note Date:  11/10/2016  Patient ID:  Kari [name], [place] Meo December 20, 1928, MRN [id number] PCP:  Earlie Server, MD  Cardiologist:  Dr. Eden Emms Electrophysiologist: Dr. Johney Frame    Chief Complaint:  Post DCCV  History of Present Illness: Kari [name] is a 83 y.o. female with history of persistent AFib started on Xarelto Sept 2017, thyroidectomy, sarcoidosis, COPD, p.HTN, diastolic CHF,   She was seen by Dr. Johney Frame, early this year w/progressive fluid accumulation suspect to be secondary to her AF (he had previously planned for DCCV but had to be cancelled because of missed xarelto dose) ultimately undergoing DCCV 02/29/16.    Did well until unfortunately a fall/trauma in August that provoked recurrent AFib which again provoked fluid accumulation/SOB, she was seen by Dr. Johney Frame and planned for DCCV which she had 10/19/16 to SR w/1st degree AVBlock, PACs.  Dr. Johney Frame mentioned may need to consider AAD if recurrent AF, and plan for echo once back in SR, if normal LVEF to stop Entresto.  Her echo noted LVEF 60-65% without significant VHD, AO 40mm  She is doing pretty well, no palpitations since her DCCV.  She denies any CP or SOB, but is feeling bloated and like her ankles/LE are more puffy then usual.  No dizziness, near syncope or syncope.  She denies symptoms of PND or orthopnea. She denies any bleeding or signs of bleeding.   Device information: MDT ILR, implanted 07/23/14  Past Medical History:  Diagnosis Date  . Abdominal pain   . Angina at rest [facility])   . Arthritis   . Asthma   . Atrial fibrillation (HCC)   . Complication of anesthesia    BP drops  . COPD (chronic obstructive pulmonary disease) (HCC)   . Diverticulosis   . GERD (gastroesophageal reflux disease)   . Helicobacter pylori gastritis 11/2011  . Hiatal hernia   . History of echocardiogram    Echo 3/17: Mild LVH, EF 55-60%, mild aortic stenosis (peak 10 mmHg), mild MR, mild LAE  . History of gallstones     . History of scarlet fever   . MI, old   . Osteoporosis   . Palpitations   . Premature atrial contractions   . Sarcoid    pulmonary and dermatologic  . Sarcoidosis, lung (HCC)   . Thyroid disease   . Zenker's diverticulum     Past Surgical History:  Procedure Laterality Date  . ABDOMINAL HYSTERECTOMY  10/1997  . cardiolite  2000  . CARDIOVERSION N/A 02/29/2016   Procedure: CARDIOVERSION;  Surgeon: Jake Bathe, MD;  Location: [facility] ENDOSCOPY;  Service: Cardiovascular;  Laterality: N/A;  . CARDIOVERSION N/A 10/19/2016   Procedure: CARDIOVERSION;  Surgeon: [name], MD;  Location: [facility] ENDOSCOPY;  Service: Cardiovascular;  Laterality: N/A;  . CATARACT EXTRACTION Bilateral   . CHOLECYSTECTOMY  04/1996  . COLONOSCOPY    . EP IMPLANTABLE DEVICE N/A 07/23/2014   Procedure: Loop Recorder Insertion;  Surgeon: [name], MD;  Location: [facility] INVASIVE CV LAB;  Service: Cardiovascular;  Laterality: N/A;  . EYE SURGERY Right 11/2015  . HERNIA REPAIR     inguinal and hiatal/abdominal  . LEG SURGERY     right leg...rod/screws  . THYROIDECTOMY      Current Outpatient Prescriptions  Medication Sig Dispense Refill  . albuterol (PROVENTIL HFA;VENTOLIN HFA) 108 (90 BASE) MCG/ACT inhaler Inhale 1 puff into the lungs every 6 (six) hours as needed for wheezing or shortness of breath.     Marland Kitchen BREO ELLIPTA 100-25 MCG/INH  AEPB     . Calcium Carbonate-Vitamin D (CALTRATE 600+D) 600-400 MG-UNIT per tablet Take 1 tablet by mouth daily.    . Cholecalciferol 2000 UNITS TABS Take 2,000 Units by mouth daily.    . clonazePAM (KLONOPIN) 0.5 MG tablet Take 0.5 mg by mouth daily as needed for anxiety.   0  . ENTRESTO 49-51 MG TAKE ONE TABLET BY MOUTH TWO TIMES A DAY AS DIRECTED  3  . levothyroxine (SYNTHROID, LEVOTHROID) 112 MCG tablet Take 112 mcg by mouth daily before breakfast.    . metoprolol succinate (TOPROL-XL) 25 MG 24 hr tablet Take 0.5 tablets (12.5 mg total) by mouth daily.    . multivitamin  (THERAGRAN) per tablet Take 1 tablet by mouth daily.    . ranitidine (ZANTAC) 150 MG tablet TAKE 1 TABLET EVERY MORNING 90 tablet 2  . VOLTAREN 1 % GEL     . XARELTO 20 MG TABS tablet TAKE 1 TABLET EVERY DAY WITH SUPPER 90 tablet 3   No current facility-administered medications for this visit.     Allergies:   Advair diskus [fluticasone-salmeterol]; Aspirin; Dulera [mometasone furo-formoterol fum]; Ivp dye [iodinated diagnostic agents]; Losartan potassium; Amitriptyline; Codeine; Oxycodone-acetaminophen; Amlodipine besylate; Azithromycin; Morphine and related; Other; Penicillins; Prevacid [lansoprazole]; Symbicort [budesonide-formoterol fumarate]; Tramadol; and Morphine   Social History:  The patient  reports that she quit smoking about 48 years ago. Her smoking use included Cigarettes and Pipe. She has a 20.00 pack-year smoking history. She has never used smokeless tobacco. She reports that she drinks alcohol. She reports that she does not use drugs.   Family History:  The patient's family history includes Diabetes in her brother, daughter, and father; Heart disease in her mother; Other in her sister; Rheumatic fever in her sister.  ROS:  Please see the history of present illness.    All other systems are reviewed and otherwise negative.   PHYSICAL EXAM:  VS:  There were no vitals taken for this visit. BMI: There is no height or weight on file to calculate BMI. Well nourished, well developed, in no acute distress  HEENT: normocephalic, atraumatic  Neck: no JVD, carotid bruits or masses Cardiac: RRR; no significant murmurs, no rubs, or gallops Lungs:  CTA b/l, no wheezing, rhonchi or rales  Abd: soft, nontender MS: no deformity or atrophy Ext: non-pitting, brawny type edema  Skin: warm and dry, no rash Neuro:  No gross deficits appreciated Psych: euthymic mood, full affect  ILR site is stable, no tethering or discomfort   EKG:  Done today and reviewed by myself is SR, 1st degree  AVblock, PACs ILR interrogation today and reviewed by myself: no AF since DCCV, battery is good  10/26/16: TTE Study Conclusions - Left ventricle: The cavity size was normal. Wall thickness was   increased in a pattern of mild LVH. Systolic function was normal.   The estimated ejection fraction was in the range of 60% to 65%.   Wall motion was normal; there were no regional wall motion   abnormalities. The study is not technically sufficient to allow   evaluation of LV diastolic function. - Aortic valve: Valve mobility was restricted. There was mild   regurgitation. - Aorta: Ascending aortic diameter: 40 mm (S). - Ascending aorta: The ascending aorta was mildly dilated. - Mitral valve: There was mild regurgitation. - Left atrium: Volume/bsa, ES (1-plane Simpson&'s, A4C): 26.4   ml/m^2. - Right atrium: The atrium was mildly dilated. - Pulmonary arteries: Systolic pressure was mildly increased. PA  peak pressure: 45 mm Hg (S). Impressions: - Compared to the prior study, there has been no significant   interval change.  Echo 04/20/15 Mild LVH, EF 55-60%, mild aortic stenosis (peak 10 mmHg), mild AI, mild MR, mild LAE  [facility] EF 60-65%, impaired LV filling, no RWMA, normal RVSF, mod Pul HTN, RVSP 50-55 mmHg, mild to mod LAE, mild AI  Records report a normal Cardiolite study in 2000  Recent Labs: 01/08/2016: Brain Natriuretic Peptide 207.7 10/14/2016: BUN 11; Creatinine, Ser 0.84; Hemoglobin 12.9; Platelets 273; Potassium 4.8; Sodium 132  No results found for requested labs within last 8760 hours.   CrCl cannot be calculated (Patient's most recent lab result is older than the maximum 21 days allowed.).   Wt Readings from Last 3 Encounters:  10/19/16 179 lb (81.2 kg)  10/14/16 179 lb 9.6 oz (81.5 kg)  06/29/16 173 lb 6.4 oz (78.7 kg)     Other studies reviewed: Additional studies/records reviewed today include: summarized  above  ASSESSMENT AND PLAN:  1. persistent Afib     CHA2DS2Vasc is 3, on Xarelto, appropriately dosed with Calc. Creat clearance of 63 by last months labs     S/p DCCV x2, last 10/19/16     Symptomatic with her AFib with SOB/CHF     Dr. Johney FrameAllred mention consideration for AAD if more AF, so far holding SR        2. HTN     Re-dsicussed minimize her sodium intake and monitor her BP at home  3. COPD     Dr. Kendrick FriesMcQuaid     She feels like her breathing is better with her current regime  4. Chronic CHF, HFpEF     She is feeling bloated and weight is up a few pounds     She has only been using lasix PRN, not daily     stop entresto with normal LVEF     She has had some liberalization of her salt intake the last few days.  Discussed with her the importance of this     Take lasix 20mg  daily, BMET in 10 days       Disposition: ROV 2 months, sooner if needed.  Current medicines are reviewed at length with the patient today.  The patient did not have any concerns regarding medicines.  Judith BlonderSigned, Cordarro Spinnato Ursy, PA-C 11/10/2016 5:11 AM     [facility] [address] [place] [address] [phone] (office)  [phone] (fax)

## 2016-11-11 LAB — CUP PACEART REMOTE DEVICE CHECK
Date Time Interrogation Session: 20181018193902
MDC IDC PG IMPLANT DT: 20160629

## 2016-11-11 NOTE — Progress Notes (Signed)
Carelink Summary Report / Loop Recorder 

## 2016-12-12 ENCOUNTER — Telehealth: Payer: Self-pay | Admitting: Internal Medicine

## 2016-12-12 ENCOUNTER — Ambulatory Visit (INDEPENDENT_AMBULATORY_CARE_PROVIDER_SITE_OTHER): Payer: Medicare PPO | Admitting: *Deleted

## 2016-12-12 DIAGNOSIS — R002 Palpitations: Secondary | ICD-10-CM | POA: Diagnosis not present

## 2016-12-12 NOTE — Telephone Encounter (Signed)
Mrs. Rogue BussingChesire is calling because her Primary  Care Physician put her back on Sherryll Burger(Entresto) and she is wanting to check with Dr. Johney FrameAllred to see if he wanted her to go [facility] after the Cardio[facility] . Please call

## 2016-12-12 NOTE — Telephone Encounter (Signed)
LPMTCB 11/19

## 2016-12-12 NOTE — Telephone Encounter (Signed)
Spoke with patient and confirmed with her that with her normal heart muscle function, it was fine for her to stop the [place].  Patient verbalized understanding.

## 2016-12-12 NOTE — Telephone Encounter (Signed)
[facility] Pre-operative Risk Assessment    Request for surgical clearance:  1. What type of surgery is being performed? Tooth extraction   2. When is this surgery scheduled? No scheduled  3. Are there any medications that need to be held prior to surgery and how long? Xarelto time held [employer] name and name of physician performing surgery? Dr. Thayer Jew   4. What is your office phone and fax number? [phone] fax [phone]  5. Anesthesia type (None, local, MAC, general) ? local   Kari [name] 12/12/2016, 4:43 PM  _________________________________________________________________   (provider comments below)

## 2016-12-12 NOTE — Progress Notes (Signed)
Carelink Summary Report / Loop Recorder 

## 2016-12-13 NOTE — Telephone Encounter (Signed)
Pt takes Xarelto for afib with CHAD2VASc score of 5 (age - 2, sex, HTN, CAD). Renal function is normal. Do not typically hold anticoagulants for single dental extractions. Prefer to continue Xarelto, however if required by dentist, ok to hold Xarelto the evening prior to extraction and resume in PM on procedure day. Clearance faxed to below number.

## 2016-12-13 NOTE — Telephone Encounter (Signed)
   Chart reviewed as part of pre-operative protocol coverage. Pt scheduled for tooth extraction. Based on guidelines does not need cardiac clearance. Can proceed. Pt however is on Xarelto. Will route to pharmacy to address.    Robbie LisBrittainy Aricka Goldberger, PA-C 12/13/2016, 2:27 PM

## 2016-12-19 ENCOUNTER — Telehealth: Payer: Self-pay | Admitting: Internal Medicine

## 2016-12-19 NOTE — Telephone Encounter (Signed)
Follow Up:   Kari [name] from Dr Lynford CitizenWalters's office checking on the status of clearance from 12-12-16 please. Please fax this to [phone].

## 2016-12-19 NOTE — Telephone Encounter (Signed)
I spoke with Jill AlexandersJustin and confirmed fax number below.  Will refax previous phone note. I asked Jill AlexandersJustin to call us back if they did not receive note today.

## 2016-12-26 LAB — CUP PACEART REMOTE DEVICE CHECK
Date Time Interrogation Session: 20181117214304
MDC IDC PG IMPLANT DT: 20160629

## 2017-01-06 ENCOUNTER — Encounter: Payer: Self-pay | Admitting: Internal Medicine

## 2017-01-09 ENCOUNTER — Ambulatory Visit (INDEPENDENT_AMBULATORY_CARE_PROVIDER_SITE_OTHER): Payer: Medicare PPO | Admitting: *Deleted

## 2017-01-09 DIAGNOSIS — R002 Palpitations: Secondary | ICD-10-CM

## 2017-01-10 NOTE — Progress Notes (Signed)
Carelink Summary Report / Loop Recorder 

## 2017-01-11 ENCOUNTER — Encounter: Payer: Self-pay | Admitting: Internal Medicine

## 2017-01-11 ENCOUNTER — Ambulatory Visit (INDEPENDENT_AMBULATORY_CARE_PROVIDER_SITE_OTHER): Payer: Medicare PPO | Admitting: Internal Medicine

## 2017-01-11 VITALS — BP 142/92 | HR 73 | Ht 63.0 in | Wt 178.0 lb

## 2017-01-11 DIAGNOSIS — I481 Persistent atrial fibrillation: Secondary | ICD-10-CM | POA: Diagnosis not present

## 2017-01-11 DIAGNOSIS — I5032 Chronic diastolic (congestive) heart failure: Secondary | ICD-10-CM | POA: Diagnosis not present

## 2017-01-11 DIAGNOSIS — R0602 Shortness of breath: Secondary | ICD-10-CM | POA: Diagnosis not present

## 2017-01-11 DIAGNOSIS — I4819 Other persistent atrial fibrillation: Secondary | ICD-10-CM

## 2017-01-11 DIAGNOSIS — I119 Hypertensive heart disease without heart failure: Secondary | ICD-10-CM | POA: Diagnosis not present

## 2017-01-11 MED ORDER — VALSARTAN 80 MG PO TABS: 80.0000 mg | ORAL_TABLET | Freq: Every day | ORAL | 3 refills | Status: DC

## 2017-01-11 NOTE — Progress Notes (Signed)
PCP: Earlie ServerMinton, Challie, MD   Primary EP: Dr Johney FrameAllred  Kari [name] is a 83 y.o. female who presents today for routine electrophysiology followup.  Since last being seen in our clinic, the patient reports doing well.  + edema, SOB.  Poor compliance with dietary restrictions.  Today, she denies symptoms of palpitations, chest pain, dizziness, presyncope, or syncope.  The patient is otherwise without complaint today.   Past Medical History:  Diagnosis Date  . Abdominal pain   . Angina at rest [facility](HCC)   . Arthritis   . Asthma   . Atrial fibrillation (HCC)   . Complication of anesthesia    BP drops  . COPD (chronic obstructive pulmonary disease) (HCC)   . Diverticulosis   . GERD (gastroesophageal reflux disease)   . Helicobacter pylori gastritis 11/2011  . Hiatal hernia   . History of echocardiogram    Echo 3/17: Mild LVH, EF 55-60%, mild aortic stenosis (peak 10 mmHg), mild MR, mild LAE  . History of gallstones   . History of scarlet fever   . MI, old   . Osteoporosis   . Palpitations   . Premature atrial contractions   . Sarcoid    pulmonary and dermatologic  . Sarcoidosis, lung (HCC)   . Thyroid disease   . Zenker's diverticulum    Past Surgical History:  Procedure Laterality Date  . ABDOMINAL HYSTERECTOMY  10/1997  . cardiolite  2000  . CARDIOVERSION N/A 02/29/2016   Procedure: CARDIOVERSION;  Surgeon: Jake BatheMark C Skains, MD;  [facility];  Service: Cardiovascular;  Laterality: N/A;  . CARDIOVERSION N/A 10/19/2016   Procedure: CARDIOVERSION;  Surgeon: Chilton Siandolph, Tiffany, MD;  [facility];  Service: Cardiovascular;  Laterality: N/A;  . CATARACT EXTRACTION Bilateral   . CHOLECYSTECTOMY  04/1996  . COLONOSCOPY    . EP IMPLANTABLE DEVICE N/A 07/23/2014   Procedure: Loop Recorder Insertion;  Surgeon: Hillis RangeJames Jamison Soward, MD;  Location: [facility] INVASIVE CV LAB;  Service: Cardiovascular;  Laterality: N/A;  . EYE SURGERY Right 11/2015  . HERNIA REPAIR     inguinal and  hiatal/abdominal  . LEG SURGERY     right leg...rod/screws  . THYROIDECTOMY      ROS- all systems are reviewed and negatives except as per HPI above  Current Outpatient Medications  Medication Sig Dispense Refill  . albuterol (PROVENTIL HFA;VENTOLIN HFA) 108 (90 BASE) MCG/ACT inhaler Inhale 1 puff into the lungs every 6 (six) hours as needed for wheezing or shortness of breath.     . Calcium Carbonate-Vitamin D (CALTRATE 600+D) 600-400 MG-UNIT per tablet Take 1 tablet by mouth daily.    . Cholecalciferol 2000 UNITS TABS Take 2,000 Units by mouth daily.    . clonazePAM (KLONOPIN) 0.5 MG tablet Take 0.5 mg by mouth daily as needed for anxiety.   0  . furosemide (LASIX) 20 MG tablet Take 20 mg by mouth daily.    Marland Kitchen. levothyroxine (SYNTHROID, LEVOTHROID) 112 MCG tablet Take 112 mcg by mouth daily before breakfast.    . metoprolol succinate (TOPROL-XL) 25 MG 24 hr tablet Take 0.5 tablets (12.5 mg total) by mouth daily.    . multivitamin (THERAGRAN) per tablet Take 1 tablet by mouth daily.    . predniSONE (DELTASONE) 5 MG tablet Take 5 mg by mouth daily.  5  . ranitidine (ZANTAC) 150 MG tablet TAKE 1 TABLET EVERY MORNING 90 tablet 2  . VOLTAREN 1 % GEL Apply topically as directed. As needed    . XARELTO 20 MG  TABS tablet TAKE 1 TABLET EVERY DAY WITH SUPPER 90 tablet 3   No current facility-administered medications for this visit.     Physical Exam: Vitals:   01/11/17 1126  BP: (!) 142/92  Pulse: 73  SpO2: 93%  Weight: 178 lb (80.7 kg)  Height: 5\' 3"  (1.6 m)    GEN- The patient is well appearing, alert and oriented x 3 today.   Head- normocephalic, atraumatic Eyes-  Sclera clear, conjunctiva pink Ears- hearing intact Oropharynx- clear Lungs- Clear to ausculation bilaterally, normal work of breathing Heart- Regular rate and rhythm, no murmurs, rubs or gallops, PMI not laterally displaced GI- soft, NT, ND, + BS Extremities- no clubbing, cyanosis, or edema  EKG tracing ordered today  is personally reviewed and shows sinus rhythm with PACs (multifocal).  Assessment and Plan:  1. Persistent afib Doing well currently By ILR (personally reviewed today) AF burden is 1.4%  2. Chronic diastolic dysfunction EF is preserved BP is elevated at times.  She reports that she felt better with entresto, though she does not have low EF to justify this. I will start valsartan 80mg  daily (though she has reported allergy to losartan, she does not recall the allergy and has recently tolerated valsartan with entresto well). Bmet, cbc today Sodium restriction Refer to general cardiology for further consultation.  3. Hypertensive cardiovascular disease Add valsartan as above Avoid salt  Return to see EP PA in 3 months [facility] Refer to general cardiology for CHF management.  Hillis RangeJames Norina Cowper MD, [facility] 01/11/2017 11:45 AM

## 2017-01-11 NOTE — Patient Instructions (Addendum)
Medication Instructions:  Your physician has recommended you make the following change in your medication:  1) Start Valsartan 80 mg daily   Labwork: Your physician recommends that you return for lab work today: BMP/CBC   Testing/Procedures: None ordered  Follow-Up: You have been referred to Dr Baldomero LamyNelson--general Cardiology  Your physician recommends that you schedule a follow-up appointment in: 3 months with Francis Dowseenee Ursuy, PA   Any Other Special Instructions Will Be Listed Below (If Applicable).   Low-Sodium Eating Plan Sodium, which is an element that makes up salt, helps you maintain a healthy balance of fluids in your body. Too much sodium can increase your blood pressure and cause fluid and waste to be held in your body. Your health care provider or dietitian may recommend following this plan if you have high blood pressure (hypertension), kidney disease, liver disease, or heart failure. Eating less sodium can help lower your blood pressure, reduce swelling, and protect your heart, liver, and kidneys. What are tips for following this plan? General guidelines  Most people on this plan should limit their sodium intake to 2,000 mg (milligrams) of sodium each day. Reading food labels  The Nutrition Facts label lists the amount of sodium in one serving of the food. If you eat more than one serving, you must multiply the listed amount of sodium by the number of servings.  Choose foods with less than 140 mg of sodium per serving.  Avoid foods with 300 mg of sodium or more per serving. Shopping  Look for lower-sodium products, often labeled as "low-sodium" or "no salt added."  Always check the sodium content even if foods are labeled as "unsalted" or "no salt added".  Buy fresh foods. ? Avoid canned foods and premade or frozen meals. ? Avoid canned, cured, or processed meats  Buy breads that have less than 80 mg of sodium per slice. Cooking  Eat more home-cooked food and less  restaurant, buffet, and fast food.  Avoid adding salt when cooking. Use salt-free seasonings or herbs instead of table salt or sea salt. Check with your health care provider or pharmacist before using salt substitutes.  Cook with plant-based oils, such as canola, sunflower, or olive oil. Meal planning  When eating at a restaurant, ask that your food be prepared with less salt or no salt, if possible.  Avoid foods that contain MSG (monosodium glutamate). MSG is sometimes added to Cong[place]inese food, bouillon, and some canned foods. What foods are recommended? The items listed may not be a complete list. Talk with your dietitian about what dietary choices are best for you. Grains Low-sodium cereals, including oats, puffed wheat and rice, and shredded wheat. Low-sodium crackers. Unsalted rice. Unsalted pasta. Low-sodium bread. Whole-grain breads and whole-grain pasta. Vegetables Fresh or frozen vegetables. "No salt added" canned vegetables. "No salt added" tomato sauce and paste. Low-sodium or reduced-sodium tomato and vegetable juice. Fruits Fresh, frozen, or canned fruit. Fruit juice. Meats and other protein foods Fresh or frozen (no salt added) meat, poultry, seafood, and fish. Low-sodium canned tuna and salmon. Unsalted nuts. Dried peas, beans, and lentils without added salt. Unsalted canned beans. Eggs. Unsalted nut butters. Dairy Milk. Soy milk. Cheese that is naturally low in sodium, such as ricotta cheese, fresh mozzarella, or Swiss cheese Low-sodium or reduced-sodium cheese. Cream cheese. Yogurt. Fats and oils Unsalted butter. Unsalted margarine with no trans fat. Vegetable oils such as canola or olive oils. Seasonings and other foods Fresh and dried herbs and spices. Salt-free seasonings. Low-sodium mustard  and ketchup. Sodium-free salad dressing. Sodium-free light mayonnaise. Fresh or refrigerated horseradish. Lemon juice. Vinegar. Homemade, reduced-sodium, or low-sodium soups. Unsalted  popcorn and pretzels. Low-salt or salt-free chips. What foods are not recommended? The items listed may not be a complete list. Talk with your dietitian about what dietary choices are best for you. Grains Instant hot cereals. Bread stuffing, pancake, and biscuit mixes. Croutons. Seasoned rice or pasta mixes. Noodle soup cups. Boxed or frozen macaroni and cheese. Regular salted crackers. Self-rising flour. Vegetables Sauerkraut, pickled vegetables, and relishes. Olives. [place] fries. Onion rings. Regular canned vegetables (not low-sodium or reduced-sodium). Regular canned tomato sauce and paste (not low-sodium or reduced-sodium). Regular tomato and vegetable juice (not low-sodium or reduced-sodium). Frozen vegetables in sauces. Meats and other protein foods Meat or fish that is salted, canned, smoked, spiced, or pickled. Bacon, ham, sausage, hotdogs, corned beef, chipped beef, packaged lunch meats, salt pork, jerky, pickled herring, anchovies, regular canned tuna, sardines, salted nuts. Dairy Processed cheese and cheese spreads. Cheese curds. Blue cheese. Feta cheese. String cheese. Regular cottage cheese. Buttermilk. Canned milk. Fats and oils Salted butter. Regular margarine. Ghee. Bacon fat. Seasonings and other foods Onion salt, garlic salt, seasoned salt, table salt, and sea salt. Canned and packaged gravies. Worcestershire sauce. Tartar sauce. Barbecue sauce. Teriyaki sauce. Soy sauce, including reduced-sodium. Steak sauce. Fish sauce. Oyster sauce. Cocktail sauce. Horseradish that you find on the shelf. Regular ketchup and mustard. Meat flavorings and tenderizers. Bouillon cubes. Hot sauce and Tabasco sauce. Premade or packaged marinades. Premade or packaged taco seasonings. Relishes. Regular salad dressings. Salsa. Potato and tortilla chips. Corn chips and puffs. Salted popcorn and pretzels. Canned or dried soups. Pizza. Frozen entrees and pot pies. Summary  Eating less sodium can help lower  your blood pressure, reduce swelling, and protect your heart, liver, and kidneys.  Most people on this plan should limit their sodium intake to 1,500-2,000 mg (milligrams) of sodium each day.  Canned, boxed, and frozen foods are high in sodium. Restaurant foods, fast foods, and pizza are also very high in sodium. You also get sodium by adding salt to food.  Try to cook at home, eat more fresh fruits and vegetables, and eat less fast food, canned, processed, or prepared foods. This information is not intended to replace advice given to you by your health care provider. Make sure you discuss any questions you have with your health care provider. Document Released: 07/02/2001 Document Revised: 01/04/2016 Document Reviewed: 01/04/2016 Elsevier Interactive Patient Education  [employer]2018 Elsevier Inc.     If you need a refill on your cardiac medications before your next appointment, please call your pharmacy.

## 2017-01-12 LAB — BASIC METABOLIC PANEL
BUN/Creatinine Ratio: 15 (ref 12–28)
BUN: 13 mg/dL (ref 8–27)
CALCIUM: 9.8 mg/dL (ref 8.7–10.3)
CO2: 25 mmol/L (ref 20–29)
CREATININE: 0.85 mg/dL (ref 0.57–1.00)
Chloride: 92 mmol/L — ABNORMAL LOW (ref 96–106)
GFR calc Af Amer: 72 mL/min/{1.73_m2} (ref >59)
GFR, EST NON AFRICAN AMERICAN: 62 mL/min/{1.73_m2} (ref >59)
GLUCOSE: 105 mg/dL — AB (ref 65–99)
Potassium: 4.9 mmol/L (ref 3.5–5.2)
Sodium: 132 mmol/L — ABNORMAL LOW (ref 134–144)

## 2017-01-12 LAB — CBC WITH DIFFERENTIAL/PLATELET
BASOS: 1 %
Basophils Absolute: 0 10*3/uL (ref 0.0–0.2)
EOS (ABSOLUTE): 0.1 10*3/uL (ref 0.0–0.4)
EOS: 1 %
HEMATOCRIT: 38.9 % (ref 34.0–46.6)
HEMOGLOBIN: 13 g/dL (ref 11.1–15.9)
IMMATURE GRANS (ABS): 0 10*3/uL (ref 0.0–0.1)
IMMATURE GRANULOCYTES: 0 %
LYMPHS: 12 %
Lymphocytes Absolute: 0.5 10*3/uL — ABNORMAL LOW (ref 0.7–3.1)
MCH: 30.8 pg (ref 26.6–33.0)
MCHC: 33.4 g/dL (ref 31.5–35.7)
MCV: 92 fL (ref 79–97)
MONOCYTES: 7 %
Monocytes Absolute: 0.3 10*3/uL (ref 0.1–0.9)
NEUTROS PCT: 79 %
Neutrophils Absolute: 3.4 10*3/uL (ref 1.4–7.0)
PLATELETS: 227 10*3/uL (ref 150–379)
RBC: 4.22 x10E6/uL (ref 3.77–5.28)
RDW: 14.7 % (ref 12.3–15.4)
WBC: 4.3 10*3/uL (ref 3.4–10.8)

## 2017-01-13 ENCOUNTER — Telehealth: Payer: Self-pay | Admitting: *Deleted

## 2017-01-13 ENCOUNTER — Telehealth: Payer: Self-pay | Admitting: Internal Medicine

## 2017-01-13 NOTE — Telephone Encounter (Signed)
LMOVM TO CALL BACK FOR RESULTS 

## 2017-01-13 NOTE — Telephone Encounter (Signed)
F/u message ° °Pt returning RN call .please call back to discuss  °

## 2017-01-13 NOTE — Telephone Encounter (Signed)
-----   Message from Hillis RangeJames Allred, MD sent at 01/13/2017 12:25 AM EST ----- Results reviewed.  Tresa EndoKelly, please inform pt of result.

## 2017-01-13 NOTE — Addendum Note (Signed)
Addended by: Jama FlavorsMANNING, Sierria Bruney L on: 01/13/2017 09:55 AM   Modules accepted: Orders

## 2017-01-13 NOTE — Telephone Encounter (Signed)
New message ° ° ° ° °Patient returning call for results. Please call °

## 2017-01-18 ENCOUNTER — Other Ambulatory Visit: Payer: Self-pay

## 2017-01-18 NOTE — Telephone Encounter (Signed)
[facility] called saying that the Valsartan was recalled and if they need to be put on a different medication.

## 2017-01-19 LAB — CUP PACEART REMOTE DEVICE CHECK
MDC IDC PG IMPLANT DT: 20160629
MDC IDC SESS DTM: 20181217213823

## 2017-01-19 LAB — CUP PACEART INCLINIC DEVICE CHECK
Implantable Pulse Generator Implant Date: 20160629
MDC IDC SESS DTM: 20181219172738

## 2017-01-19 MED ORDER — LOSARTAN POTASSIUM 50 MG PO TABS: 50.0000 mg | ORAL_TABLET | Freq: Every day | ORAL | 3 refills | Status: DC

## 2017-01-19 NOTE — Telephone Encounter (Signed)
Patient reports that she has not actually started valsartan due to the recall. She does not recall being on losartan and would prefer this option over trying another other ARB due to insurance coverage.   She is aware to call if she has any issues.

## 2017-02-08 ENCOUNTER — Ambulatory Visit (INDEPENDENT_AMBULATORY_CARE_PROVIDER_SITE_OTHER): Payer: Medicare PPO | Admitting: *Deleted

## 2017-02-08 DIAGNOSIS — R002 Palpitations: Secondary | ICD-10-CM

## 2017-02-09 NOTE — Progress Notes (Signed)
Carelink Summary Report / Loop Recorder 

## 2017-02-17 LAB — CUP PACEART REMOTE DEVICE CHECK
Date Time Interrogation Session: 20190116223930
Implantable Pulse Generator Implant Date: 20160629

## 2017-02-27 ENCOUNTER — Ambulatory Visit: Payer: Medicare PPO | Admitting: Cardiology

## 2017-02-27 ENCOUNTER — Encounter: Payer: Self-pay | Admitting: Cardiology

## 2017-02-27 VITALS — BP 134/70 | HR 60 | Ht 64.0 in | Wt 178.4 lb

## 2017-02-27 DIAGNOSIS — I10 Essential (primary) hypertension: Secondary | ICD-10-CM

## 2017-02-27 DIAGNOSIS — I480 Paroxysmal atrial fibrillation: Secondary | ICD-10-CM

## 2017-02-27 DIAGNOSIS — I5030 Unspecified diastolic (congestive) heart failure: Secondary | ICD-10-CM | POA: Diagnosis not present

## 2017-02-27 MED ORDER — SPIRONOLACTONE 25 MG PO TABS: 12.5000 mg | ORAL_TABLET | Freq: Every day | ORAL | 2 refills | Status: DC

## 2017-02-27 MED ORDER — APIXABAN 5 MG PO TABS: 5.0000 mg | ORAL_TABLET | Freq: Two times a day (BID) | ORAL | 2 refills | Status: DC

## 2017-02-27 NOTE — Patient Instructions (Signed)
Medication Instructions:   STOP TAKING XARELTO   START TAKING ELIQUIS 5 MG TWICE DAILY  START TAKING SPIRONOLACTONE 12.5 MG ONCE DAILY     Follow-Up:  3 MONTHS WITH DR Delton SeeNELSON       If you need a refill on your cardiac medications before your next appointment, please call your pharmacy.

## 2017-02-27 NOTE — Progress Notes (Signed)
Cardiology Office Note:    Date:  03/01/2017   ID:  Kari [name], DOB July 18, 1928, MRN [id number]  PCP:  Earlie Server, MD  Cardiologist: Dr Delton See, Dr Johney Frame  Referring MD: Earlie Server, MD   Chief Complaint  Patient presents with  . New Patient (Initial Visit)    History of Present Illness:    Kari [name] is a 84 y.o. female , younger appearing, with a hx of paroxysmal atrial fibrillation on chronic Xarelto, followed by Dr Johney Frame. She is very positive, and a very active person. She takes care of her household, denies any chest pain, SOB, no orthopnea, PND, she has mild stable LE edema. Denies palpitations, syncope or falls. She is accompanied by a friend from [place].  She has mild LE edema, she states that she had more energy on Entresto, I have reviewed all of her prior echos and her LVEF has never been decreased. She is concerned about hair loss that started with initiation of xarelto.She denies orthopnea or PND.  Past Medical History:  Diagnosis Date  . Abdominal pain   . Angina at rest [facility])   . Arthritis   . Asthma   . Atrial fibrillation (HCC)   . Complication of anesthesia    BP drops  . COPD (chronic obstructive pulmonary disease) (HCC)   . Diverticulosis   . GERD (gastroesophageal reflux disease)   . Helicobacter pylori gastritis 11/2011  . Hiatal hernia   . History of echocardiogram    Echo 3/17: Mild LVH, EF 55-60%, mild aortic stenosis (peak 10 mmHg), mild MR, mild LAE  . History of gallstones   . History of scarlet fever   . MI, old   . Osteoporosis   . Palpitations   . Premature atrial contractions   . Sarcoid    pulmonary and dermatologic  . Sarcoidosis, lung (HCC)   . Thyroid disease   . Zenker's diverticulum     Past Surgical History:  Procedure Laterality Date  . ABDOMINAL HYSTERECTOMY  10/1997  . cardiolite  2000  . CARDIOVERSION N/A 02/29/2016   Procedure: CARDIOVERSION;  Surgeon: Jake Bathe, MD;  Location: [facility] ENDOSCOPY;   Service: Cardiovascular;  Laterality: N/A;  . CARDIOVERSION N/A 10/19/2016   Procedure: CARDIOVERSION;  Surgeon: [name], MD;  Location: [facility] ENDOSCOPY;  Service: Cardiovascular;  Laterality: N/A;  . CATARACT EXTRACTION Bilateral   . CHOLECYSTECTOMY  04/1996  . COLONOSCOPY    . EP IMPLANTABLE DEVICE N/A 07/23/2014   Procedure: Loop Recorder Insertion;  Surgeon: [name], MD;  [facility] INVASIVE CV LAB;  Service: Cardiovascular;  Laterality: N/A;  . EYE SURGERY Right 11/2015  . HERNIA REPAIR     inguinal and hiatal/abdominal  . LEG SURGERY     right leg...rod/screws  . THYROIDECTOMY      Current Medications: Current Meds  Medication Sig  . albuterol (PROVENTIL HFA;VENTOLIN HFA) 108 (90 BASE) MCG/ACT inhaler Inhale 1 puff into the lungs every 6 (six) hours as needed for wheezing or shortness of breath.   . Calcium Carbonate-Vitamin D (CALTRATE 600+D) 600-400 MG-UNIT per tablet Take 1 tablet by mouth daily.  . Cholecalciferol 2000 UNITS TABS Take 2,000 Units by mouth daily.  . clonazePAM (KLONOPIN) 0.5 MG tablet Take 0.5 mg by mouth daily as needed for anxiety.   . Fluticasone-Salmeterol (ADVAIR HFA IN) Inhale 1 puff into the lungs daily. Patient stated her inhaler is 275-50  . furosemide (LASIX) 20 MG tablet Take 20 mg by mouth daily.  Marland Kitchen KLOR-CON  M20 20 MEQ tablet Take 10 mEq by mouth daily.  Marland Kitchen. levothyroxine (SYNTHROID, LEVOTHROID) 112 MCG tablet Take 112 mcg by mouth daily before breakfast.  . losartan (COZAAR) 50 MG tablet Take 1 tablet (50 mg total) by mouth daily.  . metoprolol succinate (TOPROL-XL) 25 MG 24 hr tablet Take 25 mg by mouth daily.  . multivitamin (THERAGRAN) per tablet Take 1 tablet by mouth daily.  . ranitidine (ZANTAC) 150 MG tablet Take 150 mg by mouth every morning.  . [DISCONTINUED] rivaroxaban (XARELTO) 20 MG TABS tablet Take 20 mg by mouth daily with supper.     Allergies:   Advair diskus [fluticasone-salmeterol]; Aspirin; Dulera [mometasone  furo-formoterol fum]; Ivp dye [iodinated diagnostic agents]; Losartan potassium; Amitriptyline; Budesonide-formoterol fumarate; Codeine; Oxycodone-acetaminophen; Sulfasalazine; Amlodipine besylate; Azithromycin; Fluticasone furoate-vilanterol; Morphine and related; Other; Penicillins; Prevacid [lansoprazole]; Tramadol; and Morphine   Social History   Socioeconomic History  . Marital status: Divorced    Spouse name: None  . Number of children: 4  . Years of education: None  . Highest education level: None  Social Needs  . Financial resource strain: None  . Food insecurity - worry: None  . Food insecurity - inability: None  . Transportation needs - medical: None  . Transportation needs - non-medical: None  Occupational History  . Occupation: clergy  Tobacco Use  . Smoking status: Former Smoker    Packs/day: 2.00    Years: 10.00    Pack years: 20.00    Types: Cigarettes, Pipe    Last attempt to quit: 01/25/1968    Years since quitting: 49.1  . Smokeless tobacco: Never Used  . Tobacco comment: pt unsure of exact month in 1977  Substance and Sexual Activity  . Alcohol use: Yes    Alcohol/week: 0.0 oz    Comment: seldom...glass wine  . Drug use: No  . Sexual activity: None  Other Topics Concern  . None  Social History Narrative   ** Merged History Encounter **        Family History: The patient's family history includes Diabetes in her brother, daughter, and father; Heart disease in her mother; Other in her sister; Rheumatic fever in her sister. There is no history of Colon cancer.  ROS:   Please see the history of present illness.    All other systems reviewed and are negative.  EKGs/Labs/Other Studies Reviewed:    The following studies were reviewed today:  EKG:  EKG is ordered today.  The ekg ordered today demonstrates   Recent Labs: 01/11/2017: BUN 13; Creatinine, Ser 0.85; Hemoglobin 13.0; Platelets 227; Potassium 4.9; Sodium 132  Recent Lipid Panel    Component  Value Date/Time   TRIG 62 02/28/2013 0132    Physical Exam:    VS:  BP 134/70   Pulse 60   Ht 5\' 4"  (1.626 m)   Wt 178 lb 6.4 oz (80.9 kg)   BMI 30.62 kg/m     Wt Readings from Last 3 Encounters:  02/27/17 178 lb 6.4 oz (80.9 kg)  01/11/17 178 lb (80.7 kg)  11/10/16 183 lb (83 kg)    GEN:  Well nourished, well developed in no acute distress HEENT: Normal NECK: No JVD; No carotid bruits LYMPHATICS: No lymphadenopathy CARDIAC: iRRR, no murmurs, rubs, gallops RESPIRATORY:  Clear to auscultation without rales, wheezing or rhonchi  ABDOMEN: Soft, non-tender, non-distended MUSCULOSKELETAL:  No edema; No deformity  SKIN: Warm and dry NEUROLOGIC:  Alert and oriented x 3 PSYCHIATRIC:  Normal affect   TTE:  11/10/2016 - Left ventricle: The cavity size was normal. Wall thickness was   increased in a pattern of mild LVH. Systolic function was normal.   The estimated ejection fraction was in the range of 60% to 65%.   Wall motion was normal; there were no regional wall motion   abnormalities. The study is not technically sufficient to allow   evaluation of LV diastolic function. - Aortic valve: Valve mobility was restricted. There was mild   regurgitation. - Aorta: Ascending aortic diameter: 40 mm (S). - Ascending aorta: The ascending aorta was mildly dilated. - Mitral valve: There was mild regurgitation. - Left atrium: Volume/bsa, ES (1-plane Simpson&'s, A4C): 26.4   ml/m^2. - Right atrium: The atrium was mildly dilated. - Pulmonary arteries: Systolic pressure was mildly increased. PA   peak pressure: 45 mm Hg (S).    ASSESSMENT:    1. Essential hypertension   2. PAF (paroxysmal atrial fibrillation) (HCC)   3. (HFpEF) heart failure with preserved ejection fraction (HCC)    PLAN:    In order of problems listed above:  1. Paroxysmal a-fib, on chronic Xarelto, no bleeding, however concern for hair loss, we will switch to Eliquis 5 mg po BID, By ILR AF burden is 1.4% 2.  HTN - well controlled 3. Acute on chronic diastolic CHF - add spironolactone 12.5 mg po daily, she states that she felt well on Entresto, however I cant find any echos with impaired LVEF, Enstresto is not indicated  Medication Adjustments/Labs and Tests Ordered: Current medicines are reviewed at length with the patient today.  Concerns regarding medicines are outlined above.  No orders of the defined types were placed in this encounter.  Meds ordered this encounter  Medications  . spironolactone (ALDACTONE) 25 MG tablet    Sig: Take 0.5 tablets (12.5 mg total) by mouth daily.    Dispense:  45 tablet    Refill:  2  . apixaban (ELIQUIS) 5 MG TABS tablet    Sig: Take 1 tablet (5 mg total) by mouth 2 (two) times daily.    Dispense:  180 tablet    Refill:  2    Took pt off of xarelto and switched to [employer], Tobias Alexander, MD  03/01/2017 10:05 PM    [facility]

## 2017-03-10 ENCOUNTER — Ambulatory Visit (INDEPENDENT_AMBULATORY_CARE_PROVIDER_SITE_OTHER): Payer: Medicare PPO | Admitting: *Deleted

## 2017-03-10 DIAGNOSIS — R002 Palpitations: Secondary | ICD-10-CM | POA: Diagnosis not present

## 2017-03-13 NOTE — Progress Notes (Signed)
Carelink Summary Report / Loop Recorder 

## 2017-03-22 ENCOUNTER — Telehealth: Payer: Self-pay | Admitting: Cardiology

## 2017-03-22 NOTE — Telephone Encounter (Signed)
Spoke w/ pt and requested that she send a manual transmission b/c her home monitor has not updated in at least 14 days.   

## 2017-04-07 LAB — CUP PACEART REMOTE DEVICE CHECK
Implantable Pulse Generator Implant Date: 20160629
MDC IDC SESS DTM: 20190215233813

## 2017-04-12 ENCOUNTER — Ambulatory Visit (INDEPENDENT_AMBULATORY_CARE_PROVIDER_SITE_OTHER): Payer: Medicare PPO | Admitting: *Deleted

## 2017-04-12 DIAGNOSIS — R002 Palpitations: Secondary | ICD-10-CM | POA: Diagnosis not present

## 2017-04-13 NOTE — Progress Notes (Signed)
Carelink Summary Report / Loop Recorder 

## 2017-04-18 ENCOUNTER — Telehealth: Payer: Self-pay | Admitting: Cardiology

## 2017-04-18 NOTE — Telephone Encounter (Signed)
Spoke with the pt who is calling in with complaints of sob and increased lower extremity edema since last night.  Pt states she has known pulmonary issues, a current hiatal hernia, and cardiac issues, so its hard to distinguish where here sob is coming from.  Pt states she ate some fried shrimp last night, and noticed increased lower extremity edema and increased sob. Pt states she has tried elevating her extremities to improve swelling, and this only helped slightly. Pt states she is taking all meds prescribed. Pt states she would like to be seen tomorrow by Dr Delton SeeNelson or a PA-C/NP in the office, for complaints mentioned. Scheduled the pt to see Nada BoozerLaura Ingold NP for tomorrow 04/19/17 at 1000.  Advised the pt to arrive 15 mins prior to this appt. Advised the pt that if her symptoms worsen between now and tomorrows OV, then she should refer to the ER for further evaluation.  Advised the pt to maintain a low sodium diet and continue her current med regimen.  Advised the pt to continue elevating her lower extremities. Advised the pt to use pillows to elevate HOB, for comfort of breathing at night. Will route this message to Dr Delton SeeNelson as an Lorain ChildesFYI. Pt verbalized understanding and agrees with this plan.

## 2017-04-18 NOTE — Telephone Encounter (Signed)
New Message   Pt c/o swelling: STAT is pt has developed SOB within 24 hours  1) How much weight have you gained and in what time span? Doesn't know  2) If swelling, where is the swelling located? legs  3) Are you currently taking a fluid pill? yes  4) Are you currently SOB? yes  5) Do you have a log of your daily weights (if so, list)? no  6) Have you gained 3 pounds in a day or 5 pounds in a week? Doesn't know  7) Have you traveled recently? no

## 2017-04-18 NOTE — Progress Notes (Deleted)
Cardiology Office Note   Date:  04/18/2017   ID:  Kari [name], DOB 10-05-1928, MRN [id number]  PCP:  Earlie ServerMinton, Challie, MD  Cardiologist:  Dr. Delton SeeNelson    No chief complaint on file.     History of Present Illness: Kari [name] is a 84 y.o. female who presents for ***  hx of paroxysmal atrial fibrillation on chronic Xarelto, followed by Dr Johney FrameAllred. She is very positive, and a very active person. She takes care of her household, denies any chest pain, SOB, no orthopnea, PND, she has mild stable LE edema. Denies palpitations, syncope or falls. She is accompanied [place] friend from [place].  She has mild LE edema, she states that she had more energy on Entresto, I have reviewed all of her prior echos and her LVEF has never been decreased. She is concerned about hair loss that started with initiation of xarelto. This was changed to Eliquis 5 mg BID  And spironolactone 12.5 mg added. ? entresto   Past Medical History:  Diagnosis Date  . Abdominal pain   . Angina at rest [facility](HCC)   . Arthritis   . Asthma   . Atrial fibrillation (HCC)   . Complication of anesthesia    BP drops  . COPD (chronic obstructive pulmonary disease) (HCC)   . Diverticulosis   . GERD (gastroesophageal reflux disease)   . Helicobacter pylori gastritis 11/2011  . Hiatal hernia   . History of echocardiogram    Echo 3/17: Mild LVH, EF 55-60%, mild aortic stenosis (peak 10 mmHg), mild MR, mild LAE  . History of gallstones   . History of scarlet fever   . MI, old   . Osteoporosis   . Palpitations   . Premature atrial contractions   . Sarcoid    pulmonary and dermatologic  . Sarcoidosis, lung (HCC)   . Thyroid disease   . Zenker's diverticulum     Past Surgical History:  Procedure Laterality Date  . ABDOMINAL HYSTERECTOMY  10/1997  . cardiolite  2000  . CARDIOVERSION N/A 02/29/2016   Procedure: CARDIOVERSION;  Surgeon: Jake BatheMark C Skains, MD;  [facility];  Service: Cardiovascular;  Laterality: N/A;   . CARDIOVERSION N/A 10/19/2016   Procedure: CARDIOVERSION;  Surgeon: Chilton Siandolph, Tiffany, MD;  [facility];  Service: Cardiovascular;  Laterality: N/A;  . CATARACT EXTRACTION Bilateral   . CHOLECYSTECTOMY  04/1996  . COLONOSCOPY    . EP IMPLANTABLE DEVICE N/A 07/23/2014   Procedure: Loop Recorder Insertion;  Surgeon: Hillis RangeJames Allred, MD;  Location: [facility] INVASIVE CV LAB;  Service: Cardiovascular;  Laterality: N/A;  . EYE SURGERY Right 11/2015  . HERNIA REPAIR     inguinal and hiatal/abdominal  . LEG SURGERY     right leg...rod/screws  . THYROIDECTOMY       Current Outpatient Medications  Medication Sig Dispense Refill  . albuterol (PROVENTIL HFA;VENTOLIN HFA) 108 (90 BASE) MCG/ACT inhaler Inhale 1 puff into the lungs every 6 (six) hours as needed for wheezing or shortness of breath.     Marland Kitchen. apixaban (ELIQUIS) 5 MG TABS tablet Take 1 tablet (5 mg total) by mouth 2 (two) times daily. 180 tablet 2  . Calcium Carbonate-Vitamin D (CALTRATE 600+D) 600-400 MG-UNIT per tablet Take 1 tablet by mouth daily.    . Cholecalciferol 2000 UNITS TABS Take 2,000 Units by mouth daily.    . clonazePAM (KLONOPIN) 0.5 MG tablet Take 0.5 mg by mouth daily as needed for anxiety.   0  . Fluticasone-Salmeterol (ADVAIR  HFA IN) Inhale 1 puff into the lungs daily. Patient stated her inhaler is 275-50    . furosemide (LASIX) 20 MG tablet Take 20 mg by mouth daily.    Marland Kitchen KLOR-CON M20 20 MEQ tablet Take 10 mEq by mouth daily.    Marland Kitchen levothyroxine (SYNTHROID, LEVOTHROID) 112 MCG tablet Take 112 mcg by mouth daily before breakfast.    . losartan (COZAAR) 50 MG tablet Take 1 tablet (50 mg total) by mouth daily. 90 tablet 3  . metoprolol succinate (TOPROL-XL) 25 MG 24 hr tablet Take 25 mg by mouth daily.    . multivitamin (THERAGRAN) per tablet Take 1 tablet by mouth daily.    . ranitidine (ZANTAC) 150 MG tablet Take 150 mg by mouth every morning.    Marland Kitchen spironolactone (ALDACTONE) 25 MG tablet Take 0.5 tablets (12.5 mg total)  by mouth daily. 45 tablet 2   No current facility-administered medications for this visit.     Allergies:   Advair diskus [fluticasone-salmeterol]; Aspirin; Dulera [mometasone furo-formoterol fum]; Ivp dye [iodinated diagnostic agents]; Losartan potassium; Amitriptyline; Budesonide-formoterol fumarate; Codeine; Oxycodone-acetaminophen; Sulfasalazine; Amlodipine besylate; Azithromycin; Fluticasone furoate-vilanterol; Morphine and related; Other; Penicillins; Prevacid [lansoprazole]; Tramadol; and Morphine    Social History:  The patient  reports that she quit smoking about 49 years ago. Her smoking use included cigarettes and pipe. She has a 20.00 pack-year smoking history. She has never used smokeless tobacco. She reports that she drinks alcohol. She reports that she does not use drugs.   Family History:  The patient's ***family history includes Diabetes in her brother, daughter, and father; Heart disease in her mother; Other in her sister; Rheumatic fever in her sister.    ROS:  General:no colds or fevers, no weight changes Skin:no rashes or ulcers HEENT:no blurred vision, no congestion CV:see HPI PUL:see HPI GI:no diarrhea constipation or melena, no indigestion GU:no hematuria, no dysuria MS:no joint pain, no claudication Neuro:no syncope, no lightheadedness Endo:no diabetes, no thyroid disease Wt Readings from Last 3 Encounters:  02/27/17 178 lb 6.4 oz (80.9 kg)  01/11/17 178 lb (80.7 kg)  11/10/16 183 lb (83 kg)     PHYSICAL EXAM: VS:  There were no vitals taken for this visit. , BMI There is no height or weight on file to calculate BMI. General:Pleasant affect, NAD Skin:Warm and dry, brisk capillary refill HEENT:normocephalic, sclera clear, mucus membranes moist Neck:supple, no JVD, no bruits  Heart:S1S2 RRR without murmur, gallup, rub or click Lungs:clear without rales, rhonchi, or wheezes ZOX:WRUE, non tender, + BS, do not palpate liver spleen or masses Ext:no lower ext  edema, 2+ pedal pulses, 2+ radial pulses Neuro:alert and oriented, MAE, follows commands, + facial symmetry    EKG:  EKG is ordered today. The ekg ordered today demonstrates ***   Recent Labs: 01/11/2017: BUN 13; Creatinine, Ser 0.85; Hemoglobin 13.0; Platelets 227; Potassium 4.9; Sodium 132    Lipid Panel    Component Value Date/Time   TRIG 62 02/28/2013 0132       Other studies Reviewed: Additional studies/ records that were reviewed today include: ***.   ASSESSMENT AND PLAN:  1.  ***   Current medicines are reviewed with the patient today.  The patient Has no concerns regarding medicines.  The following changes have been made:  See above Labs/ tests ordered today include:see above  Disposition:   FU:  see above  Signed, Nada Boozer, NP  04/18/2017 10:13 PM    [facility] [address] [place], [employer] [employer],  [place], [place] Phone: [phone]; Fax: [phone]

## 2017-04-19 ENCOUNTER — Ambulatory Visit: Payer: Medicare PPO | Admitting: Cardiology

## 2017-04-21 NOTE — Telephone Encounter (Signed)
Spoke with pt and she states she has some slight swelling in bilateral feet and ankles.  Asked pt about diuretics and she said she's only on Spironolactone 12.5mg  QD.  Inquired about Furosemide 20mg  QD. Pt states she thought that was stopped when she started the [place]hoe[place]piro.  Advised pt Cleda DaubSpiro was added and she should have continued the Furosemide.  Pt asked that I speak with son.  Her son states pt's swelling is no different than her usual, O2 is fine and BP has been fine.  Son states his mother originally wanted to call because she wondered if she needed to send in a recording from her loop.  Advised this was not necessary at this time.  Advised pt have pt restart the Furosemide and monitor swelling and to call if further issues.  Son very appreciative for call.

## 2017-04-21 NOTE — Telephone Encounter (Signed)
Follow up    Pt c/o swelling: STAT is pt has developed SOB within 24 hours  1) How much weight have you gained and in what time span? n/a  2) If swelling, where is the swelling located? Feet, ankles and legs  3) Are you currently taking a fluid pill? YES  4) Are you currently SOB?   5) Do you have a log of your daily weights (if so, list)? N/A  6) Have you gained 3 pounds in a day or 5 pounds in a week? N/A  7) Have you traveled recently? NO

## 2017-05-05 ENCOUNTER — Telehealth: Payer: Self-pay | Admitting: Internal Medicine

## 2017-05-05 NOTE — Telephone Encounter (Signed)
New Message      [facility] Pre-operative Risk Assessment    Request for surgical clearance:  1. What type of surgery is being performed? Esophageal hernia repair  2. When is this surgery scheduled? Have not schedule  3. What type of clearance is required (medical clearance vs. Pharmacy clearance to hold med vs. Both)? Cardiac Clearance  4. Are there any medications that need to be held prior to surgery and how long? no  5. Practice name and name of physician performing surgery? [facility]/ Dr. Tally Due  6. What is your office phone number [phone]   7.   What is your office fax number [phone]  8.   Anesthesia type (None, local, MAC, general) ? general   Kari [name] 05/05/2017, 8:30 AM  _________________________________________________________________   (provider comments below)

## 2017-05-08 NOTE — Telephone Encounter (Signed)
Holding anticoagulation not requested, but will get recommendations from pharmacy in case needed.

## 2017-05-08 NOTE — Telephone Encounter (Signed)
   Primary Cardiologist:Katarina Delton SeeNelson, MD  Chart reviewed as part of pre-operative protocol coverage. Because of Kari [name]'s past medical history and time since last visit, she was called to discuss her current status. She is reporting an increase in fluid retention and she says that she would feel better if she is seen prior to her procedure which has not been scheduled yet.   Pre-op covering staff: - Please schedule appointment and call patient to inform them. - Please contact requesting surgeon's office via preferred method (i.e, phone, fax) to inform them of need for appointment prior to surgery.  Berton BonJanine Bracy Pepper, NP  05/08/2017, 5:01 PM

## 2017-05-08 NOTE — Telephone Encounter (Signed)
Offered pt appt this Thursday but she declined d/t it being at 0830 and she [place] in [place].  Pt preferred late morning or afternoon appt.  Scheduled pt to see Ronie Spiesayna Dunn, PA-C 5/6.  Pt appreciative for call.

## 2017-05-08 NOTE — Telephone Encounter (Signed)
Patient with diagnosis of Afib on Eliquis for anticoagulation.    Procedure: esophageal hernia repair Date of procedure: TBD  CHADS2-VASc score of  5 (CHF, HTN, AGE, DM2, stroke/tia x 2, CAD, AGE, female)  CrCl 4159ml/min  Per office protocol, patient can hold Eliquis for 24 hours prior to procedure.

## 2017-05-15 ENCOUNTER — Ambulatory Visit (INDEPENDENT_AMBULATORY_CARE_PROVIDER_SITE_OTHER): Payer: Medicare PPO | Admitting: *Deleted

## 2017-05-15 DIAGNOSIS — R002 Palpitations: Secondary | ICD-10-CM

## 2017-05-16 NOTE — Progress Notes (Signed)
Carelink Summary Report / Loop Recorder 

## 2017-05-21 LAB — CUP PACEART REMOTE DEVICE CHECK
MDC IDC PG IMPLANT DT: 20160629
MDC IDC SESS DTM: 20190321003617

## 2017-05-26 ENCOUNTER — Encounter: Payer: Self-pay | Admitting: Physician Assistant

## 2017-05-26 NOTE — Progress Notes (Signed)
Cardiology Office Note    Date:  05/29/2017  ID:  Kari [name], DOB 05/06/1928, MRN [id number] PCP:  Earlie Server, MD  Cardiologist:  Nelson/EP-Allred   Chief Complaint: pre-op clearance  History of Present Illness:  Kari [name] is a 84 y.o. female with history of PAF, chronic diastolic CHF, chronic edema, mildly dilated aortic root/AI, arthritis, asthma, COPD, diverticulosis, GERD, hiatal hernia, scarlet fever, PACs, pulm/dem sarcoid, thyroid disease, Zenker diverticulum who presents for pre-op evaluation.   Remote nuc 2000 neg for reversible ischemia, + probable diaphragmatic attn. At some point in time in the past the patient was on Entresto but it's unclear why. This last appeared on med list 10/2016. There is a phone note 11/2016 where pt stated her PCP put her back on Entresto and she was calling to check if that was OK; given normal LVEF it was felt she coudl stop this. Valsartan was started 12/2016 due to BP. Last echo 10/2016 with mild LVH, EF 60-65%, unable to eval diastolic function, mild AI, mildly dilated ascending aorta, mild MR, mildly dilated RA, mildly increased PASP. She has a Linq in place monitored by EP, last assessment 0.8% AF burden. She was last seen 02/2017 by Dr. Delton See and feeling well with mild stable LE edema. She was concerned about hair loss on Xarelto so was changed to Eliquis.  She is accompanied by daughter Kari [name] who is a Engineer, civil (consulting). Per Milnette, the patient was hospitalized in 02/2017 at [facility] with hypoxia felt possibly due to PNA, but unclear. During that stay she was found to have aspiration felt due to a diaphragmatic hernia. Milnette outlines a procedure she had that relocated the hernia with substantial improvement in her oxygen saturations and breathing. There has been evidence that it is beginning to emerge again so surgery is planned to address this. They are hoping to get it done as soon as they can. Milnette also states there were issues  with her mother's mentation which she attributed to the propofol but it sounds like she also had marked hyponatremia at 89 which could have contributed. The patient has been feeling much better since discharge. No chest pain, dyspnea, palpitations, syncope. She had chronic LEE which the patient and her daughter states is usually as large as her thigh down to her foot, but now it is much less. She is now off Lasix and on spironolactone at a higher dose than the last visit ( ). Mentation has improved but she has had to be placed on Trintellix due to anxiety over recent events and fear of dying.    Past Medical History:  Diagnosis Date  . Abdominal pain   . Arthritis   . Asthma   . Chronic diastolic CHF (congestive heart failure) (HCC)   . Complication of anesthesia    BP drops  . COPD (chronic obstructive pulmonary disease) (HCC)   . Dilated aortic root (HCC)   . Diverticulosis   . GERD (gastroesophageal reflux disease)   . Helicobacter pylori gastritis 11/2011  . Hiatal hernia   . History of echocardiogram    Echo 3/17: Mild LVH, EF 55-60%, mild aortic stenosis (peak 10 mmHg), mild MR, mild LAE  . History of gallstones   . History of scarlet fever   . MI, old   . Mild AI (aortic insufficiency)   . Osteoporosis   . PAF (paroxysmal atrial fibrillation) (HCC)   . Palpitations   . Premature atrial contractions   . Sarcoid    pulmonary and dermatologic  .  Sarcoidosis, lung (HCC)   . Thyroid disease   . Zenker's diverticulum     Past Surgical History:  Procedure Laterality Date  . ABDOMINAL HYSTERECTOMY  10/1997  . cardiolite  2000  . CARDIOVERSION N/A 02/29/2016   Procedure: CARDIOVERSION;  Surgeon: Jake Bathe, MD;  Location: [facility] ENDOSCOPY;  Service: Cardiovascular;  Laterality: N/A;  . CARDIOVERSION N/A 10/19/2016   Procedure: CARDIOVERSION;  Surgeon: [name], MD;  Location: [facility] ENDOSCOPY;  Service: Cardiovascular;  Laterality: N/A;  . CATARACT EXTRACTION Bilateral     . CHOLECYSTECTOMY  04/1996  . COLONOSCOPY    . EP IMPLANTABLE DEVICE N/A 07/23/2014   Procedure: Loop Recorder Insertion;  Surgeon: [name], MD;  Location: [facility] INVASIVE CV LAB;  Service: Cardiovascular;  Laterality: N/A;  . EYE SURGERY Right 11/2015  . HERNIA REPAIR     inguinal and hiatal/abdominal  . LEG SURGERY     right leg...rod/screws  . THYROIDECTOMY      Current Medications: Current Meds  Medication Sig  . albuterol (PROVENTIL HFA;VENTOLIN HFA) 108 (90 BASE) MCG/ACT inhaler Inhale 1 puff into the lungs every 6 (six) hours as needed for wheezing or shortness of breath.   Marland Kitchen apixaban (ELIQUIS) 5 MG TABS tablet Take 1 tablet (5 mg total) by mouth 2 (two) times daily.  . Cholecalciferol 2000 UNITS TABS Take 2,000 Units by mouth daily.  Marland Kitchen levothyroxine (SYNTHROID, LEVOTHROID) 112 MCG tablet Take 112 mcg by mouth daily before breakfast.  . metoprolol succinate (TOPROL-XL) 25 MG 24 hr tablet Take 25 mg by mouth daily.  Marland Kitchen spironolactone (ALDACTONE) 25 MG tablet Take 25 mg by mouth daily.  Marland Kitchen vortioxetine HBr (TRINTELLIX) 5 MG TABS tablet Take 5 mg by mouth daily.    Allergies:   Advair diskus [fluticasone-salmeterol]; Aspirin; Dulera [mometasone furo-formoterol fum]; Ivp dye [iodinated diagnostic agents]; Losartan potassium; Amitriptyline; Budesonide-formoterol fumarate; Codeine; Ketorolac tromethamine; Oxycodone-acetaminophen; Sulfasalazine; Amlodipine besylate; Azithromycin; Fluticasone furoate-vilanterol; Morphine and related; Other; Prevacid [lansoprazole]; Tramadol; and Morphine   Social History   Socioeconomic History  . Marital status: Divorced    Spouse name: Not on file  . Number of children: 4  . Years of education: Not on file  . Highest education level: Not on file  Occupational History  . Occupation: clergy  Social Needs  . Financial resource strain: Not on file  . Food insecurity:    Worry: Not on file    Inability: Not on file  . Transportation needs:     Medical: Not on file    Non-medical: Not on file  Tobacco Use  . Smoking status: Former Smoker    Packs/day: 2.00    Years: 10.00    Pack years: 20.00    Types: Cigarettes, Pipe    Last attempt to quit: 01/25/1968    Years since quitting: 49.3  . Smokeless tobacco: Never Used  . Tobacco comment: pt unsure of exact month in 1977  Substance and Sexual Activity  . Alcohol use: Yes    Alcohol/week: 0.0 oz    Comment: seldom...glass wine  . Drug use: No  . Sexual activity: Not on file  Lifestyle  . Physical activity:    Days per week: Not on file    Minutes per session: Not on file  . Stress: Not on file  Relationships  . Social connections:    Talks on phone: Not on file    Gets together: Not on file    Attends religious service: Not on file    Active  member of club or organization: Not on file    Attends meetings of clubs or organizations: Not on file    Relationship status: Not on file  Other Topics Concern  . Not on file  Social History Narrative   ** Merged History Encounter **         Family History:  Family History  Problem Relation Age of Onset  . Diabetes Father   . Diabetes Daughter   . Heart disease Mother   . Diabetes Brother   . Other Sister        scarlet fever  . Rheumatic fever Sister   . Colon cancer Neg Hx     ROS:   Please see the history of present illness.  + hand cramps. All other systems are reviewed and otherwise negative.    PHYSICAL EXAM:   VS:  BP 132/70 (BP Location: Left Arm, Patient Position: Sitting, Cuff Size: Normal)   Pulse 64   Ht  (1.626 m)   Wt 177 lb 6.4 oz (80.5 kg)   SpO2 96%   BMI 30.45 kg/m   BMI: Body mass index is 30.45 kg/m. GEN: Well nourished, well developed elderly WF, in no acute distress  HEENT: normocephalic, atraumatic Neck: no JVD, carotid bruits, or masses Cardiac: RRR; no murmurs, rubs, or gallops, trace BLE edema which appears chronic venous insufficiency with varicose veins Respiratory:  clear  to auscultation bilaterally, normal work of breathing GI: soft, nontender, nondistended, + BS MS: no deformity or atrophy  Skin: warm and dry, no rash Neuro:  Alert and Oriented x 3, Strength and sensation are intact, follows commands Psych: euthymic mood, full affect  Wt Readings from Last 3 Encounters:  05/29/17 177 lb 6.4 oz (80.5 kg)  02/27/17 178 lb 6.4 oz (80.9 kg)  01/11/17 178 lb (80.7 kg)      Studies/Labs Reviewed:   EKG:  EKG was ordered today and personally reviewed by me and demonstrates NSR 62bpm 1st degree AV block no acute changes.  Recent Labs: 01/11/2017: BUN 13; Creatinine, Ser 0.85; Hemoglobin 13.0; Platelets 227; Potassium 4.9; Sodium 132   Lipid Panel    Component Value Date/Time   TRIG 62 02/28/2013 0132    Additional studies/ records that were reviewed today include: Summarized above.    ASSESSMENT & PLAN:   1. Pre-operative evaluation - revised cardiac risk index is calculated at 0.9% and therefore based on ACC/AHA guidelines, Dierra Riesgo would be at acceptable risk for the planned procedure without further cardiovascular testing. Since treatment for her hiatal hernia, her dyspnea has resolved. She has not had any recent angina. We did discuss that surgery at this age with her cardiac history does come with at least some cardiac risk but it sounds like she and her daughter really feel this hernia surgery will help her. Per pharmacist review of chart through our pre-op screening clinic, per office protocol, patient can hold Eliquis for 24 hours prior to procedure.  I will route this note to requesting party via Epic fax function upon closure of note. I reviewed the above with Dr. Delton See who is in agreement with plan. 2. Paroxysmal atrial fib - maintaining NSR by EKG today. CHADSVASC 6. See above regarding Eliquis. Daughter inquired about Lovenox and we discussed this is not typically indicated with NOAC use. 3. Chronic diastolic CHF - patient has chronic  edema which she states is markedly improved from normal. She remains off Lasix at this time. She is on spironolactone   now. She complains of occasional hand cramping - will check BMET today. We discussed the possibility of repeating echocardiogram prior to surgery above but she and her daughter would like to hold off. 4. Mildly dilated aortic root/AI - anticipate conservative surveillance.  Disposition: F/u with Dr. Delton See as scheduled 06/2017.   Medication Adjustments/Labs and Tests Ordered: Current medicines are reviewed at length with the patient today.  Concerns regarding medicines are outlined above. Medication changes, Labs and Tests ordered today are summarized above and listed in the Patient Instructions accessible in Encounters.   Signed, Laurann Montana, PA-C  05/29/2017 12:01 PM    [facility] [address] [place], [place], [place]  [address] Phone: [phone]; Fax: [phone]

## 2017-05-29 ENCOUNTER — Encounter: Payer: Self-pay | Admitting: Physician Assistant

## 2017-05-29 ENCOUNTER — Ambulatory Visit: Payer: Medicare PPO | Admitting: Physician Assistant

## 2017-05-29 VITALS — BP 132/70 | HR 64 | Ht 64.0 in | Wt 177.4 lb

## 2017-05-29 DIAGNOSIS — I480 Paroxysmal atrial fibrillation: Secondary | ICD-10-CM

## 2017-05-29 DIAGNOSIS — I5032 Chronic diastolic (congestive) heart failure: Secondary | ICD-10-CM | POA: Diagnosis not present

## 2017-05-29 DIAGNOSIS — Z79899 Other long term (current) drug therapy: Secondary | ICD-10-CM | POA: Diagnosis not present

## 2017-05-29 DIAGNOSIS — Z01810 Encounter for preprocedural cardiovascular examination: Secondary | ICD-10-CM | POA: Diagnosis not present

## 2017-05-29 DIAGNOSIS — I351 Nonrheumatic aortic (valve) insufficiency: Secondary | ICD-10-CM

## 2017-05-29 DIAGNOSIS — I77810 Thoracic aortic ectasia: Secondary | ICD-10-CM | POA: Diagnosis not present

## 2017-05-29 NOTE — Patient Instructions (Addendum)
Medication Instructions:  Your physician recommends that you continue on your current medications as directed. Please refer to the Current Medication list given to you today.  Labwork: Today: BMET  Testing/Procedures: None ordered  Follow-Up: Keep you appointment on 6/3 with Dr. Delton See.  * If you need a refill on your cardiac medications before your next appointment, please call your pharmacy.   *Please note that any paperwork needing to be filled out by the provider will need to be addressed at the front desk prior to seeing the provider. Please note that any FMLA, disability or other documents regarding health condition is subject to a $25.00 charge that must be received prior to completion of paperwork in the form of a money order or check.  Thank you for choosing [facility]!!

## 2017-05-29 NOTE — Addendum Note (Signed)
Addended by: Baird Lyons on: 05/29/2017 05:57 PM   Modules accepted: Orders

## 2017-05-30 LAB — BASIC METABOLIC PANEL
BUN/Creatinine Ratio: 25 (ref 12–28)
BUN: 19 mg/dL (ref 8–27)
CALCIUM: 9.4 mg/dL (ref 8.7–10.3)
CHLORIDE: 89 mmol/L — AB (ref 96–106)
CO2: 27 mmol/L (ref 20–29)
Creatinine, Ser: 0.75 mg/dL (ref 0.57–1.00)
GFR calc non Af Amer: 72 mL/min/{1.73_m2} (ref >59)
GFR, EST AFRICAN AMERICAN: 83 mL/min/{1.73_m2} (ref >59)
Glucose: 93 mg/dL (ref 65–99)
POTASSIUM: 4.3 mmol/L (ref 3.5–5.2)
Sodium: 131 mmol/L — ABNORMAL LOW (ref 134–144)

## 2017-06-07 ENCOUNTER — Telehealth: Payer: Self-pay | Admitting: Cardiology

## 2017-06-07 NOTE — Telephone Encounter (Signed)
Spoke w/ pt and requested that she send a manual transmission b/c her home monitor has not updated in at least 14 days.   

## 2017-06-12 LAB — CUP PACEART REMOTE DEVICE CHECK
Date Time Interrogation Session: 20190423003521
MDC IDC PG IMPLANT DT: 20160629

## 2017-06-20 ENCOUNTER — Ambulatory Visit (INDEPENDENT_AMBULATORY_CARE_PROVIDER_SITE_OTHER): Payer: Medicare PPO | Admitting: *Deleted

## 2017-06-20 DIAGNOSIS — I480 Paroxysmal atrial fibrillation: Secondary | ICD-10-CM

## 2017-06-20 NOTE — Progress Notes (Signed)
Carelink Summary Report / Loop Recorder 

## 2017-06-21 ENCOUNTER — Telehealth: Payer: Self-pay | Admitting: Cardiology

## 2017-06-21 NOTE — Telephone Encounter (Signed)
Spoke w/ pt daughter and requested that she send a manual transmission b/c her home monitor has not updated in at least 14 days.   

## 2017-06-26 ENCOUNTER — Ambulatory Visit: Payer: Medicare PPO | Admitting: Cardiology

## 2017-06-27 ENCOUNTER — Encounter: Payer: Self-pay | Admitting: Cardiology

## 2017-07-11 LAB — CUP PACEART REMOTE DEVICE CHECK
Implantable Pulse Generator Implant Date: 20160629
MDC IDC SESS DTM: 20190526014111

## 2017-07-20 ENCOUNTER — Ambulatory Visit (INDEPENDENT_AMBULATORY_CARE_PROVIDER_SITE_OTHER): Payer: Medicare PPO | Admitting: *Deleted

## 2017-07-20 DIAGNOSIS — R002 Palpitations: Secondary | ICD-10-CM

## 2017-07-21 NOTE — Progress Notes (Signed)
Carelink Summary Report / Loop Recorder 

## 2017-08-18 LAB — CUP PACEART REMOTE DEVICE CHECK
Date Time Interrogation Session: 20190628013945
Implantable Pulse Generator Implant Date: 20160629

## 2017-08-21 ENCOUNTER — Ambulatory Visit (INDEPENDENT_AMBULATORY_CARE_PROVIDER_SITE_OTHER): Payer: Medicare PPO | Admitting: *Deleted

## 2017-08-21 DIAGNOSIS — R002 Palpitations: Secondary | ICD-10-CM | POA: Diagnosis not present

## 2017-08-23 NOTE — Progress Notes (Signed)
Carelink Summary Report / Loop Recorder 

## 2017-08-30 ENCOUNTER — Encounter: Payer: Self-pay | Admitting: Cardiology

## 2017-09-26 ENCOUNTER — Ambulatory Visit (INDEPENDENT_AMBULATORY_CARE_PROVIDER_SITE_OTHER): Payer: Medicare PPO | Admitting: *Deleted

## 2017-09-26 DIAGNOSIS — R002 Palpitations: Secondary | ICD-10-CM

## 2017-09-27 NOTE — Progress Notes (Signed)
Carelink Summary Report / Loop Recorder 

## 2017-10-03 LAB — CUP PACEART REMOTE DEVICE CHECK
Implantable Pulse Generator Implant Date: 20160629
MDC IDC SESS DTM: 20190731021109

## 2017-10-20 ENCOUNTER — Other Ambulatory Visit: Payer: Self-pay | Admitting: Cardiology

## 2017-10-20 DIAGNOSIS — I10 Essential (primary) hypertension: Secondary | ICD-10-CM

## 2017-10-20 LAB — CUP PACEART REMOTE DEVICE CHECK
Implantable Pulse Generator Implant Date: 20160629
MDC IDC SESS DTM: 20190902023721

## 2017-10-23 NOTE — Telephone Encounter (Signed)
Eliquis 5mg  refill request received; pt is 84 yrs old, wt-80.5kg, Crea-0.75 on 05/29/17, last seen by Ronie Spies on 05/29/17; will send in refill to requested pharmacy.

## 2017-10-27 ENCOUNTER — Ambulatory Visit (INDEPENDENT_AMBULATORY_CARE_PROVIDER_SITE_OTHER): Payer: Medicare PPO | Admitting: *Deleted

## 2017-10-27 DIAGNOSIS — R002 Palpitations: Secondary | ICD-10-CM

## 2017-10-28 NOTE — Progress Notes (Signed)
Carelink Summary Report / Loop Recorder 

## 2017-10-29 LAB — CUP PACEART REMOTE DEVICE CHECK
Implantable Pulse Generator Implant Date: 20160629
MDC IDC SESS DTM: 20191005074056

## 2017-11-22 ENCOUNTER — Telehealth: Payer: Self-pay | Admitting: *Deleted

## 2017-11-22 NOTE — Telephone Encounter (Addendum)
Spoke patient regarding increase burden since Oct. 28, duration 37 hours, Avg V rate 86 bpm. Patient states she noticed an increase in edema, No SOB, No CP. Patient reports taking 20 mg lasix daily for the past few days. Scheduled patient 12/06/17 at 11:00 with Gypsy Balsam, NP for overdue Follow-up 03/2017. Advised patient to follow-up with PCP and if becomes SOB, CP, lightheaded to go to the ER. Patient verbalized understanding and appreciation.

## 2017-11-22 NOTE — Telephone Encounter (Signed)
Returned phone call to patient's daughter Elon Jester [facility]) to discuss phone call from 11:25 with patient. Patient's daughter reports patient has new diagnosis of dementia and was giving them conflicting information about AF episode. Discussed with Elon Jester, patient had AF episode on 11/20/17 and patient reported no symptoms other then increase in edema in her legs, No SOB, No CP. Elon Jester stated she visited her mom yesterday and her legs were very swollen and her oxygen needs increased to 2L nasal cannula. Gar Ponto we could schedule next available appointment with Cardiologist or APP. Appointment scheduled with Geni Bers, PA 11/28/17 at 11:30. Gar Ponto if noted an increased in SOB, CP, or other symptoms of fluid overload, take patient to the ED.

## 2017-11-22 NOTE — Telephone Encounter (Signed)
Follow up:   Patient daughter returning call back to the nurse. Her mother has short term memory. Patient daughter would like for someone to call her back regarding her mother. [phone]. Please call back

## 2017-11-28 ENCOUNTER — Ambulatory Visit: Payer: Medicare PPO | Admitting: Physician Assistant

## 2017-11-28 DIAGNOSIS — I5033 Acute on chronic diastolic (congestive) heart failure: Secondary | ICD-10-CM | POA: Insufficient documentation

## 2017-11-28 DIAGNOSIS — I77810 Thoracic aortic ectasia: Secondary | ICD-10-CM | POA: Insufficient documentation

## 2017-11-28 DIAGNOSIS — R609 Edema, unspecified: Secondary | ICD-10-CM | POA: Insufficient documentation

## 2017-11-28 NOTE — Progress Notes (Deleted)
Cardiology Office Note    Date:  11/28/2017   ID:  Kari [name], DOB 06-16-1928, MRN [id number]  PCP:  Earlie Server, MD  Cardiologist: Tobias Alexander, MD EPS: None  No chief complaint on file.   History of Present Illness:  Kari [name] is a 84 y.o. female with history of PAF CHA2DS2-VASc equals 6 on Eliquis, chronic diastolic CHF, chronic edema, mild dilated aortic root, aortic insufficiency, COPD.  Last echo 10/2016 mild LVH LVEF 60 to 65%, mild AI, mildly dilated ascending aorta. Linq monitor in place.   Last seen in our office 05/2017 by Lucile Crater, PA-C for preoperative clearance and was cleared for hernia surgery.  Patient added on to my schedule today because of increased edema in her legs.  She did have increased atrial fibrillation burden lasting 37 hours over this period of time.  Past Medical History:  Diagnosis Date  . Abdominal pain   . Arthritis   . Asthma   . Chronic diastolic CHF (congestive heart failure) (HCC)   . Complication of anesthesia    BP drops  . COPD (chronic obstructive pulmonary disease) (HCC)   . Dilated aortic root (HCC)   . Diverticulosis   . GERD (gastroesophageal reflux disease)   . Helicobacter pylori gastritis 11/2011  . Hiatal hernia   . History of echocardiogram    Echo 3/17: Mild LVH, EF 55-60%, mild aortic stenosis (peak 10 mmHg), mild MR, mild LAE  . History of gallstones   . History of scarlet fever   . MI, old   . Mild AI (aortic insufficiency)   . Osteoporosis   . PAF (paroxysmal atrial fibrillation) (HCC)   . Palpitations   . Premature atrial contractions   . Sarcoid    pulmonary and dermatologic  . Sarcoidosis, lung (HCC)   . Thyroid disease   . Zenker's diverticulum     Past Surgical History:  Procedure Laterality Date  . ABDOMINAL HYSTERECTOMY  10/1997  . cardiolite  2000  . CARDIOVERSION N/A 02/29/2016   Procedure: CARDIOVERSION;  Surgeon: Jake Bathe, MD;  Location: [facility] ENDOSCOPY;  Service:  Cardiovascular;  Laterality: N/A;  . CARDIOVERSION N/A 10/19/2016   Procedure: CARDIOVERSION;  Surgeon: [name], MD;  Location: [facility] ENDOSCOPY;  Service: Cardiovascular;  Laterality: N/A;  . CATARACT EXTRACTION Bilateral   . CHOLECYSTECTOMY  04/1996  . COLONOSCOPY    . EP IMPLANTABLE DEVICE N/A 07/23/2014   Procedure: Loop Recorder Insertion;  Surgeon: [name], MD;  Location: [facility] INVASIVE CV LAB;  Service: Cardiovascular;  Laterality: N/A;  . EYE SURGERY Right 11/2015  . HERNIA REPAIR     inguinal and hiatal/abdominal  . LEG SURGERY     right leg...rod/screws  . THYROIDECTOMY      Current Medications: No outpatient medications have been marked as taking for the 11/28/17 encounter (Appointment) with Dyann Kief, PA-C.     Allergies:   Advair diskus [fluticasone-salmeterol]; Aspirin; Dulera [mometasone furo-formoterol fum]; Ivp dye [iodinated diagnostic agents]; Losartan potassium; Amitriptyline; Budesonide-formoterol fumarate; Codeine; Ketorolac tromethamine; Oxycodone-acetaminophen; Sulfasalazine; Amlodipine besylate; Azithromycin; Fluticasone furoate-vilanterol; Morphine and related; Other; Prevacid [lansoprazole]; Tramadol; and Morphine   Social History   Socioeconomic History  . Marital status: Divorced    Spouse name: Not on file  . Number of children: 4  . Years of education: Not on file  . Highest education level: Not on file  Occupational History  . Occupation: clergy  Social Needs  . Financial resource strain: Not on file  . Food  insecurity:    Worry: Not on file    Inability: Not on file  . Transportation needs:    Medical: Not on file    Non-medical: Not on file  Tobacco Use  . Smoking status: Former Smoker    Packs/day: 2.00    Years: 10.00    Pack years: 20.00    Types: Cigarettes, Pipe    Last attempt to quit: 01/25/1968    Years since quitting: 49.8  . Smokeless tobacco: Never Used  . Tobacco comment: pt unsure of exact month in 1977    Substance and Sexual Activity  . Alcohol use: Yes    Alcohol/week: 0.0 standard drinks    Comment: seldom...glass wine  . Drug use: No  . Sexual activity: Not on file  Lifestyle  . Physical activity:    Days per week: Not on file    Minutes per session: Not on file  . Stress: Not on file  Relationships  . Social connections:    Talks on phone: Not on file    Gets together: Not on file    Attends religious service: Not on file    Active member of club or organization: Not on file    Attends meetings of clubs or organizations: Not on file    Relationship status: Not on file  Other Topics Concern  . Not on file  Social History Narrative   ** Merged History Encounter **         Family History:  The patient's family history includes Diabetes in her brother, daughter, and father; Heart disease in her mother; Other in her sister; Rheumatic fever in her sister.   ROS:   Please see the history of present illness.    ROS All other systems reviewed and are negative.   PHYSICAL EXAM:   VS:  There were no vitals taken for this visit.  Physical Exam  GEN: Well nourished, well developed, in no acute distress  HEENT: normal  Neck: no JVD, carotid bruits, or masses Cardiac:RRR; no murmurs, rubs, or gallops  Respiratory:  clear to auscultation bilaterally, normal work of breathing GI: soft, nontender, nondistended, + BS Ext: without cyanosis, clubbing, or edema, Good distal pulses bilaterally MS: no deformity or atrophy  Skin: warm and dry, no rash Neuro:  Alert and Oriented x 3, Strength and sensation are intact Psych: euthymic mood, full affect  Wt Readings from Last 3 Encounters:  05/29/17 177 lb 6.4 oz (80.5 kg)  02/27/17 178 lb 6.4 oz (80.9 kg)  01/11/17 178 lb (80.7 kg)      Studies/Labs Reviewed:   EKG:  EKG is*** ordered today.  The ekg ordered today demonstrates ***  Recent Labs: 01/11/2017: Hemoglobin 13.0; Platelets 227 05/29/2017: BUN 19; Creatinine, Ser 0.75;  Potassium 4.3; Sodium 131   Lipid Panel    Component Value Date/Time   TRIG 62 02/28/2013 0132    Additional studies/ records that were reviewed today include:  2D echo 10/26/2016 Study Conclusions   - Left ventricle: The cavity size was normal. Wall thickness was   increased in a pattern of mild LVH. Systolic function was normal.   The estimated ejection fraction was in the range of 60% to 65%.   Wall motion was normal; there were no regional wall motion   abnormalities. The study is not technically sufficient to allow   evaluation of LV diastolic function. - Aortic valve: Valve mobility was restricted. There was mild   regurgitation. - Aorta: Ascending aortic diameter:  40 mm (S). - Ascending aorta: The ascending aorta was mildly dilated. - Mitral valve: There was mild regurgitation. - Left atrium: Volume/bsa, ES (1-plane Simpson&'s, A4C): 26.4   ml/m^2. - Right atrium: The atrium was mildly dilated. - Pulmonary arteries: Systolic pressure was mildly increased. PA   peak pressure: 45 mm Hg (S).   Impressions:   - Compared to the prior study, there has been no significant   interval change.    ASSESSMENT:    1. Paroxysmal atrial fibrillation (HCC)   2. Acute on chronic diastolic heart failure (HCC)   3. Essential hypertension   4. Edema, unspecified type   5. Dilated aortic root (HCC)      PLAN:  In order of problems listed above:  PAF with loop recorder.  Recent increase edema associated with 37 hours of increased atrial fibrillation load on Linq.  Acute on chronic diastolic CHF with increased edema  Essential hypertension  Mildly dilated a sending aorta 40 mm on echo 10/2016   Medication Adjustments/Labs and Tests Ordered: Current medicines are reviewed at length with the patient today.  Concerns regarding medicines are outlined above.  Medication changes, Labs and Tests ordered today are listed in the Patient Instructions below. There are no Patient  Instructions on file for this visit.   Elson Clan, PA-C  11/28/2017 9:49 AM    [facility] [address] [place], [place], [place]  [address] Phone: [phone]; Fax: [phone]

## 2017-11-30 ENCOUNTER — Ambulatory Visit (INDEPENDENT_AMBULATORY_CARE_PROVIDER_SITE_OTHER): Payer: Medicare PPO | Admitting: *Deleted

## 2017-11-30 DIAGNOSIS — R002 Palpitations: Secondary | ICD-10-CM

## 2017-11-30 NOTE — Progress Notes (Signed)
Carelink Summary Report / Loop Recorder 

## 2017-12-06 ENCOUNTER — Encounter: Payer: Medicare PPO | Admitting: Nurse Practitioner

## 2017-12-06 NOTE — Progress Notes (Deleted)
Electrophysiology Office Note Date: 12/06/2017  ID:  Kari [name], DOB October 31, 1928, MRN [id number]  PCP: Earlie ServerMinton, Challie, MD Primary Cardiologist: Delton SeeNelson Electrophysiologist: Allred  CC: follow up  Kari Grovelzia Mancinas is a 84 y.o. female seen today for Dr Johney FrameAllred.  She presents today for routine electrophysiology followup.  Since last being seen in our clinic, the patient reports doing very well.  She denies chest pain, palpitations, dyspnea, PND, orthopnea, nausea, vomiting, dizziness, syncope, edema, weight gain, or early satiety.  Past Medical History:  Diagnosis Date  . Abdominal pain   . Arthritis   . Asthma   . Chronic diastolic CHF (congestive heart failure) (HCC)   . Complication of anesthesia    BP drops  . COPD (chronic obstructive pulmonary disease) (HCC)   . Dilated aortic root (HCC)   . Diverticulosis   . GERD (gastroesophageal reflux disease)   . Helicobacter pylori gastritis 11/2011  . Hiatal hernia   . History of echocardiogram    Echo 3/17: Mild LVH, EF 55-60%, mild aortic stenosis (peak 10 mmHg), mild MR, mild LAE  . History of gallstones   . History of scarlet fever   . MI, old   . Mild AI (aortic insufficiency)   . Osteoporosis   . PAF (paroxysmal atrial fibrillation) (HCC)   . Palpitations   . Premature atrial contractions   . Sarcoid    pulmonary and dermatologic  . Sarcoidosis, lung (HCC)   . Thyroid disease   . Zenker's diverticulum    Past Surgical History:  Procedure Laterality Date  . ABDOMINAL HYSTERECTOMY  10/1997  . cardiolite  2000  . CARDIOVERSION N/A 02/29/2016   Procedure: CARDIOVERSION;  Surgeon: Jake BatheMark C Skains, MD;  [facility];  Service: Cardiovascular;  Laterality: N/A;  . CARDIOVERSION N/A 10/19/2016   Procedure: CARDIOVERSION;  Surgeon: Chilton Siandolph, Tiffany, MD;  [facility];  Service: Cardiovascular;  Laterality: N/A;  . CATARACT EXTRACTION Bilateral   . CHOLECYSTECTOMY  04/1996  . COLONOSCOPY    . EP  IMPLANTABLE DEVICE N/A 07/23/2014   Procedure: Loop Recorder Insertion;  Surgeon: Hillis RangeJames Allred, MD;  [facility]: [facility] INVASIVE CV LAB;  Service: Cardiovascular;  Laterality: N/A;  . EYE SURGERY Right 11/2015  . HERNIA REPAIR     inguinal and hiatal/abdominal  . LEG SURGERY     right leg...rod/screws  . THYROIDECTOMY      Current Outpatient Medications  Medication Sig Dispense Refill  . albuterol (PROVENTIL HFA;VENTOLIN HFA) 108 (90 BASE) MCG/ACT inhaler Inhale 1 puff into the lungs every 6 (six) hours as needed for wheezing or shortness of breath.     . Cholecalciferol 2000 UNITS TABS Take 2,000 Units by mouth daily.    Marland Kitchen. ELIQUIS 5 MG TABS tablet TAKE 1 TABLET (5 MG TOTAL) BY MOUTH 2 (TWO) TIMES DAILY. 180 tablet 2  . levothyroxine (SYNTHROID, LEVOTHROID) 112 MCG tablet Take 112 mcg by mouth daily before breakfast.    . losartan (COZAAR) 50 MG tablet Take 1 tablet (50 mg total) by mouth daily. 90 tablet 3  . metoprolol succinate (TOPROL-XL) 25 MG 24 hr tablet Take 25 mg by mouth daily.    Marland Kitchen. spironolactone (ALDACTONE) 25 MG tablet Take 0.5 tablets (12.5 mg total) by mouth daily. 45 tablet 2  . spironolactone (ALDACTONE) 25 MG tablet Take 25 mg by mouth daily.    Marland Kitchen. vortioxetine HBr (TRINTELLIX) 5 MG TABS tablet Take 5 mg by mouth daily.     No current facility-administered medications for this  visit.     Allergies:   Advair diskus [fluticasone-salmeterol]; Aspirin; Dulera [mometasone furo-formoterol fum]; Ivp dye [iodinated diagnostic agents]; Losartan potassium; Amitriptyline; Budesonide-formoterol fumarate; Codeine; Ketorolac tromethamine; Oxycodone-acetaminophen; Sulfasalazine; Amlodipine besylate; Azithromycin; Fluticasone furoate-vilanterol; Morphine and related; Other; Prevacid [lansoprazole]; Tramadol; and Morphine   Social History: Social History   Socioeconomic History  . Marital status: Divorced    Spouse name: Not on file  . Number of children: 4  . Years of education: Not on  file  . Highest education level: Not on file  Occupational History  . Occupation: clergy  Social Needs  . Financial resource strain: Not on file  . Food insecurity:    Worry: Not on file    Inability: Not on file  . Transportation needs:    Medical: Not on file    Non-medical: Not on file  Tobacco Use  . Smoking status: Former Smoker    Packs/day: 2.00    Years: 10.00    Pack years: 20.00    Types: Cigarettes, Pipe    Last attempt to quit: 01/25/1968    Years since quitting: 49.8  . Smokeless tobacco: Never Used  . Tobacco comment: pt unsure of exact month in 1977  Substance and Sexual Activity  . Alcohol use: Yes    Alcohol/week: 0.0 standard drinks    Comment: seldom...glass wine  . Drug use: No  . Sexual activity: Not on file  Lifestyle  . Physical activity:    Days per week: Not on file    Minutes per session: Not on file  . Stress: Not on file  Relationships  . Social connections:    Talks on phone: Not on file    Gets together: Not on file    Attends religious service: Not on file    Active member of club or organization: Not on file    Attends meetings of clubs or organizations: Not on file    Relationship status: Not on file  . Intimate partner violence:    Fear of current or ex partner: Not on file    Emotionally abused: Not on file    Physically abused: Not on file    Forced sexual activity: Not on file  Other Topics Concern  . Not on file  Social History Narrative   ** Merged History Encounter **        Family History: Family History  Problem Relation Age of Onset  . Diabetes Father   . Diabetes Daughter   . Heart disease Mother   . Diabetes Brother   . Other Sister        scarlet fever  . Rheumatic fever Sister   . Colon cancer Neg Hx     Review of Systems: All other systems reviewed and are otherwise negative except as noted above.   Physical Exam: VS:  There were no vitals taken for this visit. , BMI There is no height or weight on  file to calculate BMI. Wt Readings from Last 3 Encounters:  05/29/17 177 lb 6.4 oz (80.5 kg)  02/27/17 178 lb 6.4 oz (80.9 kg)  01/11/17 178 lb (80.7 kg)    GEN- The patient is well appearing, alert and oriented x 3 today.   HEENT: normocephalic, atraumatic; sclera clear, conjunctiva pink; hearing intact; oropharynx clear; neck supple, no JVP Lymph- no cervical lymphadenopathy Lungs- Clear to ausculation bilaterally, normal work of breathing.  No wheezes, rales, rhonchi Heart- Regular rate and rhythm, no murmurs, rubs or gallops, PMI not laterally  displaced GI- soft, non-tender, non-distended, bowel sounds present, no hepatosplenomegaly Extremities- no clubbing, cyanosis, or edema; DP/PT/radial pulses 2+ bilaterally MS- no significant deformity or atrophy Skin- warm and dry, no rash or lesion  Psych- euthymic mood, full affect Neuro- strength and sensation are intact   EKG:  EKG is not ordered today.  Recent Labs: 01/11/2017: Hemoglobin 13.0; Platelets 227 05/29/2017: BUN 19; Creatinine, Ser 0.75; Potassium 4.3; Sodium 131    Other studies Reviewed: Additional studies/ records that were reviewed today include: Dr Johney Frame and Dr Lindaann Slough office notes   Assessment and Plan:  1.  Persistent atrial fibrillation Burden by ILR interrogation is ***% She is asymptomatic with increased burden Continue Eliquis for CHADS2VASC of 3  2.  Chronic diastolic heart failure Stable No change required today  3.  HTN Stable No change required today    Current medicines are reviewed at length with the patient today.   The patient does not have concerns regarding her medicines.  The following changes were made today:  none  Labs/ tests ordered today include: none No orders of the defined types were placed in this encounter.    Disposition:   Follow up with Dr Delton See as scheduled, Dr Johney Frame 1 year     Signed, Gypsy Balsam, NP 12/06/2017 6:57 AM   [facility] [address] [place] [address] [phone] (office) [phone] (fax)

## 2017-12-08 ENCOUNTER — Other Ambulatory Visit: Payer: Self-pay | Admitting: Internal Medicine

## 2017-12-12 ENCOUNTER — Encounter: Payer: Self-pay | Admitting: Nurse Practitioner

## 2017-12-15 ENCOUNTER — Telehealth: Payer: Self-pay

## 2017-12-15 NOTE — Telephone Encounter (Signed)
Spoke w/ pt and requested that she send a manual transmission b/c her home monitor has not updated in at least 14 days.   Talked with the pt daughter and she is in assistant living and going to be place in a permanent assistant living place. The pt monitor is not with her.

## 2017-12-18 ENCOUNTER — Encounter: Payer: Self-pay | Admitting: Cardiology

## 2018-01-03 ENCOUNTER — Encounter: Payer: Self-pay | Admitting: Cardiology

## 2018-01-09 ENCOUNTER — Encounter: Payer: Self-pay | Admitting: Cardiology

## 2018-01-20 LAB — CUP PACEART REMOTE DEVICE CHECK
Date Time Interrogation Session: 20191107094236
MDC IDC PG IMPLANT DT: 20160629

## 2018-02-22 ENCOUNTER — Ambulatory Visit: Payer: Medicare PPO | Admitting: Cardiology

## 2019-01-25 DIAGNOSIS — U071 COVID-19: Secondary | ICD-10-CM

## 2019-01-25 HISTORY — DX: COVID-19: U07.1

## 2019-05-05 ENCOUNTER — Inpatient Hospital Stay (HOSPITAL_COMMUNITY)
Admission: EM | Admit: 2019-05-05 | Discharge: 2019-05-10 | DRG: 291 | Disposition: A | Discharge status: Skilled Nursing Facility | Payer: Medicare Other | Source: Skilled Nursing Facility | Attending: Internal Medicine | Admitting: Internal Medicine

## 2019-05-05 ENCOUNTER — Other Ambulatory Visit: Payer: Self-pay

## 2019-05-05 ENCOUNTER — Encounter (HOSPITAL_COMMUNITY): Payer: Self-pay | Admitting: Emergency Medicine

## 2019-05-05 ENCOUNTER — Emergency Department (HOSPITAL_COMMUNITY): Payer: Medicare Other

## 2019-05-05 DIAGNOSIS — Z79899 Other long term (current) drug therapy: Secondary | ICD-10-CM

## 2019-05-05 DIAGNOSIS — M810 Age-related osteoporosis without current pathological fracture: Secondary | ICD-10-CM | POA: Diagnosis present

## 2019-05-05 DIAGNOSIS — I5033 Acute on chronic diastolic (congestive) heart failure: Secondary | ICD-10-CM | POA: Diagnosis not present

## 2019-05-05 DIAGNOSIS — E669 Obesity, unspecified: Secondary | ICD-10-CM | POA: Diagnosis present

## 2019-05-05 DIAGNOSIS — Z833 Family history of diabetes mellitus: Secondary | ICD-10-CM

## 2019-05-05 DIAGNOSIS — Z7189 Other specified counseling: Secondary | ICD-10-CM

## 2019-05-05 DIAGNOSIS — Z6834 Body mass index (BMI) 34.0-34.9, adult: Secondary | ICD-10-CM

## 2019-05-05 DIAGNOSIS — E890 Postprocedural hypothyroidism: Secondary | ICD-10-CM | POA: Diagnosis present

## 2019-05-05 DIAGNOSIS — I130 Hypertensive heart and chronic kidney disease with heart failure and stage 1 through stage 4 chronic kidney disease, or unspecified chronic kidney disease: Secondary | ICD-10-CM | POA: Diagnosis not present

## 2019-05-05 DIAGNOSIS — I4821 Permanent atrial fibrillation: Secondary | ICD-10-CM | POA: Diagnosis present

## 2019-05-05 DIAGNOSIS — Z515 Encounter for palliative care: Secondary | ICD-10-CM

## 2019-05-05 DIAGNOSIS — J9621 Acute and chronic respiratory failure with hypoxia: Secondary | ICD-10-CM | POA: Diagnosis present

## 2019-05-05 DIAGNOSIS — Z7901 Long term (current) use of anticoagulants: Secondary | ICD-10-CM

## 2019-05-05 DIAGNOSIS — Z881 Allergy status to other antibiotic agents status: Secondary | ICD-10-CM

## 2019-05-05 DIAGNOSIS — I10 Essential (primary) hypertension: Secondary | ICD-10-CM | POA: Diagnosis present

## 2019-05-05 DIAGNOSIS — K219 Gastro-esophageal reflux disease without esophagitis: Secondary | ICD-10-CM | POA: Diagnosis present

## 2019-05-05 DIAGNOSIS — I4891 Unspecified atrial fibrillation: Secondary | ICD-10-CM | POA: Diagnosis present

## 2019-05-05 DIAGNOSIS — N179 Acute kidney failure, unspecified: Secondary | ICD-10-CM | POA: Diagnosis present

## 2019-05-05 DIAGNOSIS — I350 Nonrheumatic aortic (valve) stenosis: Secondary | ICD-10-CM | POA: Diagnosis present

## 2019-05-05 DIAGNOSIS — D869 Sarcoidosis, unspecified: Secondary | ICD-10-CM

## 2019-05-05 DIAGNOSIS — R627 Adult failure to thrive: Secondary | ICD-10-CM | POA: Diagnosis present

## 2019-05-05 DIAGNOSIS — R0602 Shortness of breath: Secondary | ICD-10-CM

## 2019-05-05 DIAGNOSIS — I080 Rheumatic disorders of both mitral and aortic valves: Secondary | ICD-10-CM | POA: Diagnosis present

## 2019-05-05 DIAGNOSIS — I509 Heart failure, unspecified: Secondary | ICD-10-CM

## 2019-05-05 DIAGNOSIS — Z79890 Hormone replacement therapy (postmenopausal): Secondary | ICD-10-CM

## 2019-05-05 DIAGNOSIS — E876 Hypokalemia: Secondary | ICD-10-CM | POA: Diagnosis present

## 2019-05-05 DIAGNOSIS — Z888 Allergy status to other drugs, medicaments and biological substances status: Secondary | ICD-10-CM

## 2019-05-05 DIAGNOSIS — Z66 Do not resuscitate: Secondary | ICD-10-CM | POA: Diagnosis not present

## 2019-05-05 DIAGNOSIS — Z8616 Personal history of COVID-19: Secondary | ICD-10-CM

## 2019-05-05 DIAGNOSIS — N1832 Chronic kidney disease, stage 3b: Secondary | ICD-10-CM | POA: Diagnosis present

## 2019-05-05 DIAGNOSIS — I2510 Atherosclerotic heart disease of native coronary artery without angina pectoris: Secondary | ICD-10-CM | POA: Diagnosis present

## 2019-05-05 DIAGNOSIS — Z87891 Personal history of nicotine dependence: Secondary | ICD-10-CM

## 2019-05-05 DIAGNOSIS — F0390 Unspecified dementia without behavioral disturbance: Secondary | ICD-10-CM | POA: Diagnosis present

## 2019-05-05 DIAGNOSIS — J441 Chronic obstructive pulmonary disease with (acute) exacerbation: Secondary | ICD-10-CM | POA: Diagnosis present

## 2019-05-05 DIAGNOSIS — I272 Pulmonary hypertension, unspecified: Secondary | ICD-10-CM | POA: Diagnosis present

## 2019-05-05 DIAGNOSIS — F410 Panic disorder [episodic paroxysmal anxiety] without agoraphobia: Secondary | ICD-10-CM | POA: Diagnosis present

## 2019-05-05 DIAGNOSIS — I890 Lymphedema, not elsewhere classified: Secondary | ICD-10-CM | POA: Diagnosis present

## 2019-05-05 DIAGNOSIS — Z886 Allergy status to analgesic agent status: Secondary | ICD-10-CM

## 2019-05-05 DIAGNOSIS — Z8249 Family history of ischemic heart disease and other diseases of the circulatory system: Secondary | ICD-10-CM

## 2019-05-05 DIAGNOSIS — I252 Old myocardial infarction: Secondary | ICD-10-CM

## 2019-05-05 DIAGNOSIS — Z885 Allergy status to narcotic agent status: Secondary | ICD-10-CM

## 2019-05-05 DIAGNOSIS — Z8719 Personal history of other diseases of the digestive system: Secondary | ICD-10-CM

## 2019-05-05 DIAGNOSIS — D860 Sarcoidosis of lung: Secondary | ICD-10-CM | POA: Diagnosis present

## 2019-05-05 DIAGNOSIS — D631 Anemia in chronic kidney disease: Secondary | ICD-10-CM | POA: Diagnosis present

## 2019-05-05 DIAGNOSIS — J449 Chronic obstructive pulmonary disease, unspecified: Secondary | ICD-10-CM | POA: Diagnosis present

## 2019-05-05 DIAGNOSIS — I77810 Thoracic aortic ectasia: Secondary | ICD-10-CM | POA: Diagnosis present

## 2019-05-05 DIAGNOSIS — K449 Diaphragmatic hernia without obstruction or gangrene: Secondary | ICD-10-CM | POA: Diagnosis present

## 2019-05-05 DIAGNOSIS — Z9981 Dependence on supplemental oxygen: Secondary | ICD-10-CM

## 2019-05-05 DIAGNOSIS — Z90710 Acquired absence of both cervix and uterus: Secondary | ICD-10-CM

## 2019-05-05 HISTORY — DX: Nonrheumatic mitral (valve) insufficiency: I34.0

## 2019-05-05 HISTORY — DX: Chronic respiratory failure, unspecified whether with hypoxia or hypercapnia: J96.10

## 2019-05-05 HISTORY — DX: Pulmonary hypertension, unspecified: I27.20

## 2019-05-05 HISTORY — DX: Nonrheumatic aortic (valve) stenosis: I35.0

## 2019-05-05 LAB — CBC
HCT: 34.2 % — ABNORMAL LOW (ref 36.0–46.0)
Hemoglobin: 10.5 g/dL — ABNORMAL LOW (ref 12.0–15.0)
MCH: 30 pg (ref 26.0–34.0)
MCHC: 30.7 g/dL (ref 30.0–36.0)
MCV: 97.7 fL (ref 80.0–100.0)
Platelets: 288 10*3/uL (ref 150–400)
RBC: 3.5 MIL/uL — ABNORMAL LOW (ref 3.87–5.11)
RDW: 15.3 % (ref 11.5–15.5)
WBC: 8 10*3/uL (ref 4.0–10.5)
nRBC: 0 % (ref 0.0–0.2)

## 2019-05-05 LAB — BRAIN NATRIURETIC PEPTIDE: B Natriuretic Peptide: 363 pg/mL — ABNORMAL HIGH (ref 0.0–100.0)

## 2019-05-05 LAB — BASIC METABOLIC PANEL
Anion gap: 14 (ref 5–15)
BUN: 42 mg/dL — ABNORMAL HIGH (ref 8–23)
CO2: 35 mmol/L — ABNORMAL HIGH (ref 22–32)
Calcium: 8.3 mg/dL — ABNORMAL LOW (ref 8.9–10.3)
Chloride: 86 mmol/L — ABNORMAL LOW (ref 98–111)
Creatinine, Ser: 2.42 mg/dL — ABNORMAL HIGH (ref 0.44–1.00)
GFR calc Af Amer: 20 mL/min — ABNORMAL LOW (ref >60)
GFR calc non Af Amer: 17 mL/min — ABNORMAL LOW (ref >60)
Glucose, Bld: 116 mg/dL — ABNORMAL HIGH (ref 70–99)
Potassium: 3.1 mmol/L — ABNORMAL LOW (ref 3.5–5.1)
Sodium: 135 mmol/L (ref 135–145)

## 2019-05-05 LAB — PROTIME-INR
INR: 1.3 — ABNORMAL HIGH (ref 0.8–1.2)
Prothrombin Time: 15.7 seconds — ABNORMAL HIGH (ref 11.4–15.2)

## 2019-05-05 LAB — MAGNESIUM: Magnesium: 1.8 mg/dL (ref 1.7–2.4)

## 2019-05-05 LAB — TROPONIN I (HIGH SENSITIVITY): Troponin I (High Sensitivity): 26 ng/L — ABNORMAL HIGH (ref <18)

## 2019-05-05 MED ORDER — FUROSEMIDE 10 MG/ML IJ SOLN
40.0000 mg | Freq: Once | INTRAMUSCULAR | Status: AC
  Administered 2019-05-05: 40 mg via INTRAVENOUS
  Filled 2019-05-05: qty 4

## 2019-05-05 MED ORDER — POTASSIUM CHLORIDE CRYS ER 20 MEQ PO TBCR
40.0000 meq | EXTENDED_RELEASE_TABLET | Freq: Once | ORAL | Status: AC
  Administered 2019-05-05: 40 meq via ORAL
  Filled 2019-05-05: qty 2

## 2019-05-05 MED ORDER — POTASSIUM CHLORIDE 10 MEQ/100ML IV SOLN
10.0000 meq | Freq: Once | INTRAVENOUS | Status: AC
  Administered 2019-05-05: 10 meq via INTRAVENOUS
  Filled 2019-05-05: qty 100

## 2019-05-05 MED ORDER — SODIUM CHLORIDE 0.9% FLUSH: 3.0000 mL | Freq: Once | INTRAVENOUS | Status: DC

## 2019-05-05 NOTE — ED Notes (Signed)
Patient daughter Georgann Housekeeper calling asking when patient gets a room to please call her she has medical information to give staff  [phone]

## 2019-05-05 NOTE — ED Triage Notes (Signed)
Pt transported from [facility] for edema to lower extremities. Pt is dementia patient.

## 2019-05-05 NOTE — ED Provider Notes (Signed)
[facility] EMERGENCY DEPARTMENT Provider Note   CSN: [id number] Arrival date & time: 05/05/19  1904     History Chief Complaint  Patient presents with  . Leg Swelling    Kari [name] is a 86 y.o. female.  The history is provided by the patient, the EMS personnel and a relative. History limited by: dementia.     86 year old female transported from [facility] with a past medical history of chronic diastolic heart failure, COPD, previous MI, CAD, paroxysmal A. fib on Eliquis, dementia presenting to the emergency department brought in by EMS with concern for bilateral lower extremity edema.  Per the patient's daughter the patient has had decreased urination with increased bilateral lower extremity. Patient recently diagnosed with CKD III. The patient denies CP, fevers chills, cough, congestion, rhinorrhea, abdominal pain, changes in bowel function.  Patient notes decreased urination.  Patient denies any dysuria or frequency.  Patient denies any back pain.  No rashes noted.  No known sick contacts or Covid contacts.  Past Medical History:  Diagnosis Date  . Abdominal pain   . Arthritis   . Asthma   . Chronic diastolic CHF (congestive heart failure) (HCC)   . Complication of anesthesia    BP drops  . COPD (chronic obstructive pulmonary disease) (HCC)   . Dilated aortic root (HCC)   . Diverticulosis   . GERD (gastroesophageal reflux disease)   . Helicobacter pylori gastritis 11/2011  . Hiatal hernia   . History of echocardiogram    Echo 3/17: Mild LVH, EF 55-60%, mild aortic stenosis (peak 10 mmHg), mild MR, mild LAE  . History of gallstones   . History of scarlet fever   . MI, old   . Mild AI (aortic insufficiency)   . Osteoporosis   . PAF (paroxysmal atrial fibrillation) (HCC)   . Palpitations   . Premature atrial contractions   . Sarcoid    pulmonary and dermatologic  . Sarcoidosis, lung (HCC)   . Thyroid disease   . Zenker's diverticulum      Patient Active Problem List   Diagnosis Date Noted  . Edema 11/28/2017  . Acute on chronic diastolic heart failure (HCC) 11/28/2017  . Mild ascending aorta dilatation (HCC) 11/28/2017  . Dyspnea 11/02/2015  . Essential hypertension 01/28/2015  . Palpitation 10/28/2014  . Pulmonary hypertension (HCC) 08/26/2014  . Atrial fibrillation (HCC) 07/23/2014  . Abdominal pain, epigastric 04/18/2013  . Fall 03/01/2013  . Multiple fractures of ribs of right side 03/01/2013  . Acute respiratory failure (HCC) 03/01/2013  . Adrenal hemorrhage (HCC) 03/01/2013  . Sternal fracture 03/01/2013  . COPD (chronic obstructive pulmonary disease) (HCC) 03/01/2013  . Pulmonary contusion 03/01/2013  . Flail chest 02/27/2013  . Allergic rhinitis 11/25/2012  . GERD (gastroesophageal reflux disease) 10/07/2011  . Hiatal hernia 10/07/2011  . Asthma with COPD (HCC) 10/07/2011  . Sarcoidosis 10/07/2011  . First degree atrioventricular block 08/30/2011  . INTERMITTENT VERTIGO 02/23/2009  . MITRAL VALVE INSUFF&AORTIC VALVE INSUFF 01/26/2009  . ATRIAL FLUTTER 01/26/2009  . PALPITATIONS 01/26/2009    Past Surgical History:  Procedure Laterality Date  . ABDOMINAL HYSTERECTOMY  10/1997  . cardiolite  2000  . CARDIOVERSION N/A 02/29/2016   Procedure: CARDIOVERSION;  Surgeon: Jake Bathe, MD;  Location: [facility] ENDOSCOPY;  Service: Cardiovascular;  Laterality: N/A;  . CARDIOVERSION N/A 10/19/2016   Procedure: CARDIOVERSION;  Surgeon: [name], MD;  Location: [facility] ENDOSCOPY;  Service: Cardiovascular;  Laterality: N/A;  . CATARACT EXTRACTION Bilateral   .  CHOLECYSTECTOMY  04/1996  . COLONOSCOPY    . EP IMPLANTABLE DEVICE N/A 07/23/2014   Procedure: Loop Recorder Insertion;  Surgeon: [name], MD;  Location: [facility] INVASIVE CV LAB;  Service: Cardiovascular;  Laterality: N/A;  . EYE SURGERY Right 11/2015  . HERNIA REPAIR     inguinal and hiatal/abdominal  . LEG SURGERY     right leg...rod/screws  .  THYROIDECTOMY       OB History   No obstetric history on file.     Family History  Problem Relation Age of Onset  . Diabetes Father   . Diabetes Daughter   . Heart disease Mother   . Diabetes Brother   . Other Sister        scarlet fever  . Rheumatic fever Sister   . Colon cancer Neg Hx     Social History   Tobacco Use  . Smoking status: Former Smoker    Packs/day: 2.00    Years: 10.00    Pack years: 20.00    Types: Cigarettes, Pipe    Quit date: 01/25/1968    Years since quitting: 51.3  . Smokeless tobacco: Never Used  . Tobacco comment: pt unsure of exact month in 1977  Substance Use Topics  . Alcohol use: Yes    Alcohol/week: 0.0 standard drinks    Comment: seldom...glass wine  . Drug use: No    Home Medications Prior to Admission medications   Medication Sig Start Date End Date Taking? Authorizing Provider  albuterol (PROVENTIL HFA;VENTOLIN HFA) 108 (90 BASE) MCG/ACT inhaler Inhale 1 puff into the lungs every 6 (six) hours as needed for wheezing or shortness of breath.     [provider]  Cholecalciferol 2000 UNITS TABS Take 2,000 Units by mouth daily.    [provider]  ELIQUIS 5 MG TABS tablet TAKE 1 TABLET (5 MG TOTAL) BY MOUTH 2 (TWO) TIMES DAILY. 10/23/17   Lars Masson, MD  levothyroxine (SYNTHROID, LEVOTHROID) 112 MCG tablet Take 112 mcg by mouth daily before breakfast.    [provider]  losartan (COZAAR) 50 MG tablet Take 1 tablet (50 mg total) by mouth daily. 01/19/17 04/19/17  Allred, Fayrene Fearing, MD  metoprolol succinate (TOPROL-XL) 25 MG 24 hr tablet Take 25 mg by mouth daily.    [provider]  spironolactone (ALDACTONE) 25 MG tablet Take 0.5 tablets (12.5 mg total) by mouth daily. 02/27/17 05/28/17  Lars Masson, MD  spironolactone (ALDACTONE) 25 MG tablet Take 25 mg by mouth daily.    [provider]  vortioxetine HBr (TRINTELLIX) 5 MG TABS tablet Take 5 mg by mouth daily.    [provider]      Allergies    Advair diskus [fluticasone-salmeterol], Aspirin, Dulera [mometasone furo-formoterol fum], Ivp dye [iodinated diagnostic agents], Losartan potassium, Propofol, Amitriptyline, Budesonide-formoterol fumarate, Codeine, Ketorolac tromethamine, Oxycodone-acetaminophen, Sulfasalazine, Acetaminophen, Alprazolam, Amitriptyline hcl, Amlodipine besylate, Azithromycin, Fluticasone furoate-vilanterol, Iodine, Morphine and related, Other, Oxycodone hcl, Prevacid [lansoprazole], Tramadol, and Morphine  Review of Systems   Review of Systems  Unable to perform ROS: Dementia  Constitutional: Negative for activity change, appetite change, chills, diaphoresis, fatigue and fever.  HENT: Negative for congestion and rhinorrhea.   Respiratory: Negative for cough, shortness of breath and wheezing.   Cardiovascular: Positive for leg swelling. Negative for chest pain and palpitations.  Gastrointestinal: Negative for abdominal distention, abdominal pain, diarrhea, nausea and vomiting.  Genitourinary: Positive for decreased urine volume. Negative for difficulty urinating, dysuria, frequency and urgency.  Musculoskeletal: Negative  for gait problem.  Skin: Negative for color change and wound.  Neurological: Negative for dizziness, syncope, weakness, light-headedness and headaches.  All other systems reviewed and are negative.   Physical Exam Updated Vital Signs BP 112/63   Pulse 80   Temp 98 F (36.7 C)   Resp (!) 27   Ht 5\' 4"  (1.626 m)   Wt 80 kg   SpO2 100%   BMI 30.27 kg/m   Physical Exam Vitals and nursing note reviewed.  Constitutional:      General: She is not in acute distress.    Appearance: Normal appearance. She is normal weight. She is not ill-appearing.  HENT:     Head: Normocephalic.     Right Ear: External ear normal.     Left Ear: External ear normal.     Nose: Nose normal.     Mouth/Throat:     Mouth: Mucous membranes are moist.     Pharynx: Oropharynx is clear.   Eyes:     Extraocular Movements: Extraocular movements intact.  Cardiovascular:     Rate and Rhythm: Normal rate and regular rhythm.     Pulses: Normal pulses.     Heart sounds: Normal heart sounds.  Pulmonary:     Effort: Tachypnea and respiratory distress (mild) present. No accessory muscle usage.     Breath sounds: Examination of the left-upper field reveals wheezing. Examination of the right-lower field reveals decreased breath sounds. Examination of the left-lower field reveals decreased breath sounds. Decreased breath sounds and wheezing present. No rhonchi.  Abdominal:     General: Bowel sounds are normal.     Palpations: Abdomen is soft.     Tenderness: There is no abdominal tenderness. There is no guarding.  Musculoskeletal:     Cervical back: Normal range of motion.     Right lower leg: Edema present.     Left lower leg: Edema present.     Comments: 2+ pitting to the shins  Skin:    General: Skin is warm and dry.     Capillary Refill: Capillary refill takes less than 2 seconds.  Neurological:     General: No focal deficit present.     Mental Status: She is alert and oriented to person, place, and time. Mental status is at baseline.  Psychiatric:        Mood and Affect: Mood normal.     ED Results / Procedures / Treatments   Labs (all labs ordered are listed, but only abnormal results are displayed) Labs Reviewed  BASIC METABOLIC PANEL - Abnormal; Notable for the following components:      Result Value   Potassium 3.1 (*)    Chloride 86 (*)    CO2 35 (*)    Glucose, Bld 116 (*)    BUN 42 (*)    Creatinine, Ser 2.42 (*)    Calcium 8.3 (*)    GFR calc non Af Amer 17 (*)    GFR calc Af Amer 20 (*)    All other components within normal limits  CBC - Abnormal; Notable for the following components:   RBC 3.50 (*)    Hemoglobin 10.5 (*)    HCT 34.2 (*)    All other components within normal limits  BRAIN NATRIURETIC PEPTIDE - Abnormal; Notable for the following  components:   B Natriuretic Peptide 363.0 (*)    All other components within normal limits  PROTIME-INR - Abnormal; Notable for the following components:   Prothrombin Time 15.7 (*)  INR 1.3 (*)    All other components within normal limits  TROPONIN I (HIGH SENSITIVITY) - Abnormal; Notable for the following components:   Troponin I (High Sensitivity) 26 (*)    All other components within normal limits  SARS CORONAVIRUS 2 (TAT 6-24 HRS)  MAGNESIUM  TROPONIN I (HIGH SENSITIVITY)    EKG None  Radiology DG Chest 2 View  Result Date: 05/05/2019 CLINICAL DATA:  Edema complains of leg swelling EXAM: CHEST - 2 VIEW COMPARISON:  01/08/2016 FINDINGS: Cardiac loop recorder projects over left heart. Heart size remains enlarged. Stable when compared to the previous exam. Signs of hiatal hernia as before. Lungs with mild pulmonary vascular congestion, no consolidation or evidence of frank edema. No pleural fluid. Signs of prior trauma to the right humerus and right chest wall. IMPRESSION: Cardiomegaly with mild pulmonary vascular congestion. No evidence of frank edema. Electronically Signed   By: Donzetta Kohut M.D.   On: 05/05/2019 19:35    Procedures Procedures (including critical care time)  Medications Ordered in ED Medications  sodium chloride flush (NS) 0.9 % injection 3 mL (3 mLs Intravenous Not Given 05/05/19 2111)  furosemide (LASIX) injection 40 mg (40 mg Intravenous Given 05/05/19 2241)  potassium chloride 10 mEq in 100 mL IVPB (10 mEq Intravenous New Bag/Given 05/05/19 2247)  potassium chloride SA (KLOR-CON) CR tablet 40 mEq (40 mEq Oral Given 05/05/19 2249)    ED Course  I have reviewed the triage vital signs and the nursing notes.  Pertinent labs & imaging results that were available during my care of the patient were reviewed by me and considered in my medical decision making (see chart for details).    MDM Rules/Calculators/A&P                      86 year old female  transported from [facility] with a past medical history of chronic diastolic heart failure, COPD, previous MI, CAD, paroxysmal A. fib on Eliquis, dementia presenting to the emergency department brought in by EMS with concern for bilateral lower extremity edema.   Differential diagnoses considered include CHF exacerbation, ACS, less likely PE, PTX, pericarditis, myocarditis, acute on chronic renal failure.   ECG interpreted by me demonstrated A. fib at 79 bpm, left axis, LAFB mildly prolonged QTC of 461 ms, RSR' pattern, no ST or T wave changes suggestive of acute ischemia overall similar EKG compared to previous  CXR interpreted by me demonstrated cardiomegaly with mild pulmonary vascular congestion  Labs demonstrated hypokalemia to 3.1, hypochloremia 86, elevated bicarb to 35, creatinine elevated 2.42 with a BUN of 42, hypocalcemia 8.3, no significant leukocytosis, normocytic anemia with a hemoglobin 10.5 and an MCV of 97.7, platelets 288, initial troponin mildly elevated 26, BNP elevated 363 suggestive of exacerbation of patient's chronic heart failure, INR 1.3, magnesium one-point  Patient appears to have acute on chronic renal failure with concomitant hypervolemia/heart failure exacerbation. Will replete potassium IV and p.o. with a plan for concomitant diuresis with IV lasix 40 mg  Upon reassessment patient's work of breathing appears to be slightly increased from initial exam.  Given the patient's AKI and evidence of volume overload in the setting of likely acute on chronic heart failure exacerbation, will admit the patient to medicine for ongoing evaluation, monitoring and management.  The plan for this patient was discussed with Dr. Clarene Duke, who voiced agreement and who oversaw evaluation and treatment of this patient.  Final Clinical Impression(s) / ED Diagnoses Final diagnoses:  Acute  on chronic congestive heart failure, unspecified heart failure type (HCC)  AKI (acute kidney  injury) (HCC)  Hypokalemia    Rx / DC Orders ED Discharge Orders    None       Gracy BruinsBaquet, Viveka Wilmeth, MD 05/06/19 0008    Clarene DukeLittle, Ambrose Finlandachel Morgan, MD 05/09/19 1907

## 2019-05-05 NOTE — ED Notes (Signed)
Pt's daughter Marcelino Duster can be reached at [phone].

## 2019-05-05 NOTE — ED Notes (Signed)
Pt's daughter informed this RN pt with HX CHF was seen by the nephrologist two weeks ago with  diuretics medication adjustment and kidney failure stage 3 diagnosis. Renal ultrasound pending results. Venous doppler scheduled this week. Pt takes decreased urinate and increased bilateral leg swelling.

## 2019-05-06 ENCOUNTER — Encounter (HOSPITAL_COMMUNITY): Payer: Self-pay | Admitting: Internal Medicine

## 2019-05-06 ENCOUNTER — Inpatient Hospital Stay (HOSPITAL_COMMUNITY): Payer: Medicare Other

## 2019-05-06 DIAGNOSIS — N183 Chronic kidney disease, stage 3 unspecified: Secondary | ICD-10-CM | POA: Diagnosis not present

## 2019-05-06 DIAGNOSIS — D869 Sarcoidosis, unspecified: Secondary | ICD-10-CM

## 2019-05-06 DIAGNOSIS — N179 Acute kidney failure, unspecified: Secondary | ICD-10-CM | POA: Diagnosis present

## 2019-05-06 DIAGNOSIS — I509 Heart failure, unspecified: Secondary | ICD-10-CM

## 2019-05-06 DIAGNOSIS — D631 Anemia in chronic kidney disease: Secondary | ICD-10-CM | POA: Diagnosis present

## 2019-05-06 DIAGNOSIS — K449 Diaphragmatic hernia without obstruction or gangrene: Secondary | ICD-10-CM | POA: Diagnosis present

## 2019-05-06 DIAGNOSIS — Z8616 Personal history of COVID-19: Secondary | ICD-10-CM | POA: Diagnosis not present

## 2019-05-06 DIAGNOSIS — D860 Sarcoidosis of lung: Secondary | ICD-10-CM | POA: Diagnosis present

## 2019-05-06 DIAGNOSIS — I4821 Permanent atrial fibrillation: Secondary | ICD-10-CM | POA: Diagnosis present

## 2019-05-06 DIAGNOSIS — I080 Rheumatic disorders of both mitral and aortic valves: Secondary | ICD-10-CM | POA: Diagnosis not present

## 2019-05-06 DIAGNOSIS — I4891 Unspecified atrial fibrillation: Secondary | ICD-10-CM

## 2019-05-06 DIAGNOSIS — I10 Essential (primary) hypertension: Secondary | ICD-10-CM | POA: Diagnosis not present

## 2019-05-06 DIAGNOSIS — R627 Adult failure to thrive: Secondary | ICD-10-CM | POA: Diagnosis present

## 2019-05-06 DIAGNOSIS — E876 Hypokalemia: Secondary | ICD-10-CM | POA: Diagnosis present

## 2019-05-06 DIAGNOSIS — J441 Chronic obstructive pulmonary disease with (acute) exacerbation: Secondary | ICD-10-CM | POA: Diagnosis present

## 2019-05-06 DIAGNOSIS — I272 Pulmonary hypertension, unspecified: Secondary | ICD-10-CM

## 2019-05-06 DIAGNOSIS — M810 Age-related osteoporosis without current pathological fracture: Secondary | ICD-10-CM | POA: Diagnosis present

## 2019-05-06 DIAGNOSIS — I890 Lymphedema, not elsewhere classified: Secondary | ICD-10-CM | POA: Diagnosis present

## 2019-05-06 DIAGNOSIS — Z66 Do not resuscitate: Secondary | ICD-10-CM | POA: Diagnosis not present

## 2019-05-06 DIAGNOSIS — K219 Gastro-esophageal reflux disease without esophagitis: Secondary | ICD-10-CM

## 2019-05-06 DIAGNOSIS — I5033 Acute on chronic diastolic (congestive) heart failure: Secondary | ICD-10-CM | POA: Diagnosis present

## 2019-05-06 DIAGNOSIS — J449 Chronic obstructive pulmonary disease, unspecified: Secondary | ICD-10-CM | POA: Diagnosis not present

## 2019-05-06 DIAGNOSIS — I350 Nonrheumatic aortic (valve) stenosis: Secondary | ICD-10-CM | POA: Diagnosis present

## 2019-05-06 DIAGNOSIS — I480 Paroxysmal atrial fibrillation: Secondary | ICD-10-CM | POA: Diagnosis not present

## 2019-05-06 DIAGNOSIS — Z6834 Body mass index (BMI) 34.0-34.9, adult: Secondary | ICD-10-CM | POA: Diagnosis not present

## 2019-05-06 DIAGNOSIS — Z515 Encounter for palliative care: Secondary | ICD-10-CM | POA: Diagnosis not present

## 2019-05-06 DIAGNOSIS — Z9981 Dependence on supplemental oxygen: Secondary | ICD-10-CM | POA: Diagnosis not present

## 2019-05-06 DIAGNOSIS — F0390 Unspecified dementia without behavioral disturbance: Secondary | ICD-10-CM | POA: Diagnosis present

## 2019-05-06 DIAGNOSIS — N1832 Chronic kidney disease, stage 3b: Secondary | ICD-10-CM | POA: Diagnosis present

## 2019-05-06 DIAGNOSIS — Z7189 Other specified counseling: Secondary | ICD-10-CM | POA: Diagnosis not present

## 2019-05-06 DIAGNOSIS — J9621 Acute and chronic respiratory failure with hypoxia: Secondary | ICD-10-CM | POA: Diagnosis present

## 2019-05-06 DIAGNOSIS — I130 Hypertensive heart and chronic kidney disease with heart failure and stage 1 through stage 4 chronic kidney disease, or unspecified chronic kidney disease: Secondary | ICD-10-CM | POA: Diagnosis present

## 2019-05-06 DIAGNOSIS — E890 Postprocedural hypothyroidism: Secondary | ICD-10-CM | POA: Diagnosis present

## 2019-05-06 LAB — COMPREHENSIVE METABOLIC PANEL
ALT: 11 U/L (ref 0–44)
AST: 15 U/L (ref 15–41)
Albumin: 3.6 g/dL (ref 3.5–5.0)
Alkaline Phosphatase: 64 U/L (ref 38–126)
Anion gap: 12 (ref 5–15)
BUN: 42 mg/dL — ABNORMAL HIGH (ref 8–23)
CO2: 42 mmol/L — ABNORMAL HIGH (ref 22–32)
Calcium: 8.3 mg/dL — ABNORMAL LOW (ref 8.9–10.3)
Chloride: 89 mmol/L — ABNORMAL LOW (ref 98–111)
Creatinine, Ser: 1.94 mg/dL — ABNORMAL HIGH (ref 0.44–1.00)
GFR calc Af Amer: 26 mL/min — ABNORMAL LOW (ref >60)
GFR calc non Af Amer: 22 mL/min — ABNORMAL LOW (ref >60)
Glucose, Bld: 103 mg/dL — ABNORMAL HIGH (ref 70–99)
Potassium: 3.3 mmol/L — ABNORMAL LOW (ref 3.5–5.1)
Sodium: 143 mmol/L (ref 135–145)
Total Bilirubin: 0.9 mg/dL (ref 0.3–1.2)
Total Protein: 6.6 g/dL (ref 6.5–8.1)

## 2019-05-06 LAB — ECHOCARDIOGRAM COMPLETE
Height: 64 in
Weight: 2821.89 oz

## 2019-05-06 LAB — CBC WITH DIFFERENTIAL/PLATELET
Abs Immature Granulocytes: 0.04 10*3/uL (ref 0.00–0.07)
Basophils Absolute: 0.1 10*3/uL (ref 0.0–0.1)
Basophils Relative: 1 %
Eosinophils Absolute: 0.5 10*3/uL (ref 0.0–0.5)
Eosinophils Relative: 6 %
HCT: 31.4 % — ABNORMAL LOW (ref 36.0–46.0)
Hemoglobin: 9.6 g/dL — ABNORMAL LOW (ref 12.0–15.0)
Immature Granulocytes: 1 %
Lymphocytes Relative: 6 %
Lymphs Abs: 0.5 10*3/uL — ABNORMAL LOW (ref 0.7–4.0)
MCH: 30.2 pg (ref 26.0–34.0)
MCHC: 30.6 g/dL (ref 30.0–36.0)
MCV: 98.7 fL (ref 80.0–100.0)
Monocytes Absolute: 0.7 10*3/uL (ref 0.1–1.0)
Monocytes Relative: 9 %
Neutro Abs: 6.1 10*3/uL (ref 1.7–7.7)
Neutrophils Relative %: 77 %
Platelets: 251 10*3/uL (ref 150–400)
RBC: 3.18 MIL/uL — ABNORMAL LOW (ref 3.87–5.11)
RDW: 15.3 % (ref 11.5–15.5)
WBC: 7.9 10*3/uL (ref 4.0–10.5)
nRBC: 0 % (ref 0.0–0.2)

## 2019-05-06 LAB — TROPONIN I (HIGH SENSITIVITY)
Troponin I (High Sensitivity): 25 ng/L — ABNORMAL HIGH (ref <18)
Troponin I (High Sensitivity): 26 ng/L — ABNORMAL HIGH (ref <18)

## 2019-05-06 LAB — SARS CORONAVIRUS 2 (TAT 6-24 HRS): SARS Coronavirus 2: NEGATIVE

## 2019-05-06 LAB — MAGNESIUM: Magnesium: 1.9 mg/dL (ref 1.7–2.4)

## 2019-05-06 MED ORDER — ACETAMINOPHEN 325 MG PO TABS: 650.0000 mg | ORAL_TABLET | ORAL | Status: DC | PRN

## 2019-05-06 MED ORDER — SODIUM CHLORIDE 0.9% FLUSH
3.0000 mL | Freq: Two times a day (BID) | INTRAVENOUS | Status: DC
  Administered 2019-05-06 – 2019-05-09 (×9): 3 mL via INTRAVENOUS

## 2019-05-06 MED ORDER — METHYLPREDNISOLONE SODIUM SUCC 40 MG IJ SOLR
40.0000 mg | Freq: Two times a day (BID) | INTRAMUSCULAR | Status: DC
  Administered 2019-05-06 – 2019-05-07 (×3): 40 mg via INTRAVENOUS
  Filled 2019-05-06 (×3): qty 1

## 2019-05-06 MED ORDER — FUROSEMIDE 10 MG/ML IJ SOLN
40.0000 mg | Freq: Two times a day (BID) | INTRAMUSCULAR | Status: DC
  Administered 2019-05-06 – 2019-05-07 (×2): 40 mg via INTRAVENOUS
  Filled 2019-05-06 (×2): qty 4

## 2019-05-06 MED ORDER — APIXABAN 2.5 MG PO TABS
2.5000 mg | ORAL_TABLET | Freq: Two times a day (BID) | ORAL | Status: DC
  Administered 2019-05-06 – 2019-05-09 (×8): 2.5 mg via ORAL
  Filled 2019-05-06 (×9): qty 1

## 2019-05-06 MED ORDER — FUROSEMIDE 10 MG/ML IJ SOLN
20.0000 mg | Freq: Two times a day (BID) | INTRAMUSCULAR | Status: DC
  Administered 2019-05-06: 20 mg via INTRAVENOUS
  Filled 2019-05-06: qty 2

## 2019-05-06 MED ORDER — SODIUM CHLORIDE 0.9% FLUSH: 3.0000 mL | INTRAVENOUS | Status: DC | PRN

## 2019-05-06 MED ORDER — IPRATROPIUM-ALBUTEROL 0.5-2.5 (3) MG/3ML IN SOLN
3.0000 mL | RESPIRATORY_TRACT | Status: DC | PRN
  Administered 2019-05-07: 3 mL via RESPIRATORY_TRACT
  Filled 2019-05-06: qty 3

## 2019-05-06 MED ORDER — SODIUM CHLORIDE 0.9 % IV SOLN: 250.0000 mL | INTRAVENOUS | Status: DC | PRN

## 2019-05-06 MED ORDER — POTASSIUM CHLORIDE CRYS ER 20 MEQ PO TBCR
40.0000 meq | EXTENDED_RELEASE_TABLET | Freq: Once | ORAL | Status: AC
  Administered 2019-05-06: 40 meq via ORAL
  Filled 2019-05-06: qty 2

## 2019-05-06 MED ORDER — ONDANSETRON HCL 4 MG/2ML IJ SOLN: 4.0000 mg | Freq: Four times a day (QID) | INTRAMUSCULAR | Status: DC | PRN

## 2019-05-06 NOTE — H&P (Signed)
History and Physical   Kari [name] ZOX:[id number] DOB: 1928/09/11 DOA: 05/05/2019  Referring MD/NP/PA: Dr. Clarene DukeLittle  PCP: Earlie ServerMinton, Challie, MD   Outpatient Specialists: None  [facility]  Chief Complaint: Worsening lower extremity edema shortness of breath  HPI: Kari Grovelzia Junod is a 86 y.o. female with medical history significant of chronic diastolic CHF, COPD, chronic kidney disease stage III, hypothyroidism, valvular heart disease, GERD, sarcoidosis who presents from skilled nursing facility with worsening lower extremity edema as well as shortness of breath.  She has had chronic lymphedema which has gotten worse.  Also worsening renal function as well as exertional dyspnea.  She has had a history of the CHF with the last echo on file from 2019.  At that time her EF is 60 to 65%.  Also now has pulmonary hypertension.  Patient is here now with markedly swollen lower extremity.  Her creatinine is above 2 the last creatinine here in 2018 was normal.  She has documented chronic kidney disease stage III but most recent creatinine is unknown.  Patient has had no melena.  No nausea vomiting or diarrhea.  She has some orthopnea.  She is morbidly obese.  At this point she appears to have fluid overload and is being admitted with acute exacerbation of CHF.Marland Kitchen.  ED Course: Temperature is 98 blood pressure 143/102 pulse 87 respirate 29 oxygen sat 98% room air.  White count 8.0 hemoglobin 10.5 and platelets 288.  Sodium is 135 potassium 3.1 chloride 86 CO2 35 BUN 42 creatinine 2.42 and calcium 8.3.  BNP is 363.  Hemoglobin is 10.5 otherwise CBC is within normal.  Troponin is 26.  Magnesium 1.8.  PT 15.7 INR 1.3.  Chest x-ray showed cardiomegaly with mild pulmonary vascular congestion with no frank edema.  Patient being admitted to the hospital for evaluation.  Review of Systems: As per HPI otherwise 10 point review of systems negative.    Past Medical  History:  Diagnosis Date  . Abdominal pain   . Arthritis   . Asthma   . Chronic diastolic CHF (congestive heart failure) (HCC)   . Complication of anesthesia    BP drops  . COPD (chronic obstructive pulmonary disease) (HCC)   . Dilated aortic root (HCC)   . Diverticulosis   . GERD (gastroesophageal reflux disease)   . Helicobacter pylori gastritis 11/2011  . Hiatal hernia   . History of echocardiogram    Echo 3/17: Mild LVH, EF 55-60%, mild aortic stenosis (peak 10 mmHg), mild MR, mild LAE  . History of gallstones   . History of scarlet fever   . MI, old   . Mild AI (aortic insufficiency)   . Osteoporosis   . PAF (paroxysmal atrial fibrillation) (HCC)   . Palpitations   . Premature atrial contractions   . Sarcoid    pulmonary and dermatologic  . Sarcoidosis, lung (HCC)   . Thyroid disease   . Zenker's diverticulum     Past Surgical History:  Procedure Laterality Date  . ABDOMINAL HYSTERECTOMY  10/1997  . cardiolite  2000  . CARDIOVERSION N/A 02/29/2016   Procedure: CARDIOVERSION;  Surgeon: Jake BatheMark C Skains, MD;  [facility];  Service: Cardiovascular;  Laterality: N/A;  . CARDIOVERSION N/A 10/19/2016   Procedure: CARDIOVERSION;  Surgeon: Chilton Siandolph, Tiffany, MD;  [facility];  Service: Cardiovascular;  Laterality: N/A;  . CATARACT EXTRACTION Bilateral   . CHOLECYSTECTOMY  04/1996  . COLONOSCOPY    . EP IMPLANTABLE DEVICE N/A  07/23/2014   Procedure: Loop Recorder Insertion;  Surgeon: Thompson Grayer, MD;  Location: [facility];  Service: Cardiovascular;  Laterality: N/A;  . EYE SURGERY Right 11/2015  . HERNIA REPAIR     inguinal and hiatal/abdominal  . LEG SURGERY     right leg...rod/screws  . THYROIDECTOMY       reports that she quit smoking about 51 years ago. Her smoking use included cigarettes and pipe. She has a 20.00 pack-year smoking history. She has never used smokeless tobacco. She reports current alcohol use. She reports that she does not  use drugs.  Allergies  Allergen Reactions  . Advair Diskus [Fluticasone-Salmeterol] Other (See Comments)    Severe muscle pain, Couldn't walk at all  . Aspirin Shortness Of Breath, Nausea Only, Swelling and Other (See Comments)    ONLY IN HIGH DOSES- stomach pain, also  . Dulera [Mometasone Furo-Formoterol Fum] Other (See Comments)    Caused A-FIB  . Ivp Dye [Iodinated Diagnostic Agents]     Pt states she thinks she had BP drop-unsure---Pre Treatment pt states she does fine  . Losartan Potassium Shortness Of Breath and Swelling  . Propofol Other (See Comments)    Severely prolonged altered mental status and increased dementia   . Amitriptyline Other (See Comments)    Couldn't sleep  . Budesonide-Formoterol Fumarate Other (See Comments)    Leg cramps and Visual deficit  . Codeine Nausea And Vomiting, Palpitations and Other (See Comments)    Causes arrhythmia(s) and "goes crazy"  . Ketorolac Tromethamine Swelling and Other (See Comments)    Angioedema   . Oxycodone-Acetaminophen Other (See Comments)    Confusion  . Sulfasalazine Other (See Comments)    Unknown reaction  . Acetaminophen Other (See Comments)    Reaction ??  . Alprazolam Other (See Comments)    "Allergic," per MAR  . Amitriptyline Hcl Other (See Comments)    "Could not sleep"- "allergic," per MAR  . Amlodipine Besylate Swelling  . Azithromycin Other (See Comments)    Irreg pulse  . Fluticasone Furoate-Vilanterol Other (See Comments)    Severe eye issues and saw flashing lights  . Iodine Other (See Comments)    "Allergic," per MAR, possible hypotenison  . Morphine And Related Hives and Other (See Comments)    Caused arrhythmias and "goes crazy"  . Other Hives and Other (See Comments)    Opioids - Morphine Analogues- Narcotics - reverse effect on her, couldn't sleep, arrhythmias  . Oxycodone Hcl Other (See Comments)    Confusion   . Prevacid [Lansoprazole] Other (See Comments)    Unknown   . Tramadol Hives   . Morphine Palpitations and Other (See Comments)    Tachycardia    Family History  Problem Relation Age of Onset  . Diabetes Father   . Diabetes Daughter   . Heart disease Mother   . Diabetes Brother   . Other Sister        scarlet fever  . Rheumatic fever Sister   . Colon cancer Neg Hx      Prior to Admission medications   Medication Sig Start Date End Date Taking? Authorizing Provider  albuterol (PROVENTIL HFA;VENTOLIN HFA) 108 (90 BASE) MCG/ACT inhaler Inhale 1 puff into the lungs every 6 (six) hours as needed for wheezing or shortness of breath.     [provider]  Cholecalciferol 2000 UNITS TABS Take 2,000 Units by mouth daily.    [provider]  ELIQUIS 5 MG TABS tablet TAKE  1 TABLET (5 MG TOTAL) BY MOUTH 2 (TWO) TIMES DAILY. 10/23/17   Lars Masson, MD  levothyroxine (SYNTHROID, LEVOTHROID) 112 MCG tablet Take 112 mcg by mouth daily before breakfast.    [provider]  losartan (COZAAR) 50 MG tablet Take 1 tablet (50 mg total) by mouth daily. 01/19/17 04/19/17  Allred, Fayrene Fearing, MD  metoprolol succinate (TOPROL-XL) 25 MG 24 hr tablet Take 25 mg by mouth daily.    [provider]  spironolactone (ALDACTONE) 25 MG tablet Take 0.5 tablets (12.5 mg total) by mouth daily. 02/27/17 05/28/17  Lars Masson, MD  spironolactone (ALDACTONE) 25 MG tablet Take 25 mg by mouth daily.    [provider]  vortioxetine HBr (TRINTELLIX) 5 MG TABS tablet Take 5 mg by mouth daily.    [provider]    Physical Exam: Vitals:   05/05/19 2330 05/05/19 2345 05/06/19 0000 05/06/19 0015  BP: 112/63 117/65 (!) 113/58 (!) 115/57  Pulse: 80 78 85 79  Resp: (!) 27 (!) 24 (!) 22 (!) 21  Temp:      TempSrc:      SpO2: 100% 100% 99% 100%  Weight:      Height:          Constitutional: Morbidly obese, in no acute distress Vitals:   05/05/19 2330 05/05/19 2345 05/06/19 0000 05/06/19 0015  BP: 112/63 117/65 (!) 113/58 (!) 115/57  Pulse:  80 78 85 79  Resp: (!) 27 (!) 24 (!) 22 (!) 21  Temp:      TempSrc:      SpO2: 100% 100% 99% 100%  Weight:      Height:       Eyes: PERRL, lids and conjunctivae normal ENMT: Mucous membranes are moist. Posterior pharynx clear of any exudate or lesions.Normal dentition.  Neck: normal, supple, no masses, no thyromegaly Respiratory: Decreased air entry with basal crackles no wheeze no rhonchi, normal respiratory effort. No accessory muscle use.  Cardiovascular: Irregularly irregular rate and rhythm, no murmurs / rubs / gallops.  4+ pedal edema with significant lymphedema. 2+ pedal pulses. No carotid bruits.  Abdomen: no tenderness, no masses palpated. No hepatosplenomegaly. Bowel sounds positive.  Musculoskeletal: no clubbing / cyanosis. No joint deformity upper and lower extremities. Good ROM, no contractures. Normal muscle tone.  Skin: Bilateral lower extremity red all the way to the knees.  Not hot and no oozing but appears to be chronic stasis dermatitis no rashes, lesions, ulcers. No induration Neurologic: CN 2-12 grossly intact. Sensation intact, DTR normal. Strength 5/5 in all 4.  Psychiatric: Normal judgment and insight. Alert and oriented x 3. Normal mood.     Labs on Admission: I have personally reviewed following labs and imaging studies  CBC: Recent Labs  Lab 05/05/19 1920  WBC 8.0  HGB 10.5*  HCT 34.2*  MCV 97.7  PLT 288   Basic Metabolic Panel: Recent Labs  Lab 05/05/19 1920 05/05/19 2211  NA 135  --   K 3.1*  --   CL 86*  --   CO2 35*  --   GLUCOSE 116*  --   BUN 42*  --   CREATININE 2.42*  --   CALCIUM 8.3*  --   MG  --  1.8   GFR: Estimated Creatinine Clearance: 16.1 mL/min (A) (by C-G formula based on SCr of 2.42 mg/dL (H)). Liver Function Tests: No results for input(s): AST, ALT, ALKPHOS, BILITOT, PROT, ALBUMIN in the last 168 hours. No results for input(s):  LIPASE, AMYLASE in the last 168 hours. No results for input(s): AMMONIA in the last 168  hours. Coagulation Profile: Recent Labs  Lab 05/05/19 2211  INR 1.3*   Cardiac Enzymes: No results for input(s): CKTOTAL, CKMB, CKMBINDEX, TROPONINI in the last 168 hours. BNP (last 3 results) No results for input(s): PROBNP in the last 8760 hours. HbA1C: No results for input(s): HGBA1C in the last 72 hours. CBG: No results for input(s): GLUCAP in the last 168 hours. Lipid Profile: No results for input(s): CHOL, HDL, LDLCALC, TRIG, CHOLHDL, LDLDIRECT in the last 72 hours. Thyroid Function Tests: No results for input(s): TSH, T4TOTAL, FREET4, T3FREE, THYROIDAB in the last 72 hours. Anemia Panel: No results for input(s): VITAMINB12, FOLATE, FERRITIN, TIBC, IRON, RETICCTPCT in the last 72 hours. Urine analysis:    Component Value Date/Time   COLORURINE YELLOW 03/07/2013 1120   APPEARANCEUR CLEAR 03/07/2013 1120   LABSPEC 1.006 03/07/2013 1120   PHURINE 8.0 03/07/2013 1120   GLUCOSEU NEGATIVE 03/07/2013 1120   GLUCOSEU NEGATIVE 12/15/2011 1146   HGBUR NEGATIVE 03/07/2013 1120   BILIRUBINUR NEGATIVE 03/07/2013 1120   KETONESUR NEGATIVE 03/07/2013 1120   PROTEINUR NEGATIVE 03/07/2013 1120   UROBILINOGEN 0.2 03/07/2013 1120   NITRITE NEGATIVE 03/07/2013 1120   LEUKOCYTESUR TRACE (A) 03/07/2013 1120   Sepsis Labs: @LABRCNTIP (procalcitonin:4,lacticidven:4) )No results found for this or any previous visit (from the past 240 hour(s)).   Radiological Exams on Admission: DG Chest 2 View  Result Date: 05/05/2019 CLINICAL DATA:  Edema complains of leg swelling EXAM: CHEST - 2 VIEW COMPARISON:  01/08/2016 FINDINGS: Cardiac loop recorder projects over left heart. Heart size remains enlarged. Stable when compared to the previous exam. Signs of hiatal hernia as before. Lungs with mild pulmonary vascular congestion, no consolidation or evidence of frank edema. No pleural fluid. Signs of prior trauma to the right humerus and right chest wall. IMPRESSION: Cardiomegaly with mild pulmonary  vascular congestion. No evidence of frank edema. Electronically Signed   By: 01/10/2016 M.D.   On: 05/05/2019 19:35    EKG: Independently reviewed.  It shows atrial fibrillation with a rate of 76.  No significant ST changes.  Assessment/Plan Principal Problem:   Acute exacerbation of CHF (congestive heart failure) (HCC) Active Problems:   MITRAL VALVE INSUFF&AORTIC VALVE INSUFF   GERD (gastroesophageal reflux disease)   Asthma with COPD (HCC)   Sarcoidosis   Atrial fibrillation (HCC)   Pulmonary hypertension (HCC)   Essential hypertension     #1 acute exacerbation of CHF: Patient appears to be fluid overloaded.  She also has worsening renal function.  We will admit the patient and gently diurese.  We will keep an eye on her renal function to avoid worsening renal function.  She has been on ARB and other medications though would likely be on hold.  Her EF is currently unknown the last known EF is 2 years ago and it was more diastolic dysfunction.  I will therefore order another echocardiogram.  Continue close monitoring.  #2 chronic kidney disease stage III: We will watch renal function in the setting of diuresis.  Continue monitoring.  #3 atrial fibrillation: Rate is controlled.  Continue with Eliquis.  #4 pulmonary hypertension: Continue with echocardiogram.  #5 history of sarcoidosis: Stable.  Continue home regimen.  #6 essential hypertension: Continue home regimen  #7 GERD: Confirm and resume home regimen.   DVT prophylaxis: Eliquis Code Status: Full code Family Communication: Daughter over the phone Disposition Plan: Home Consults called: None but may  consider cardiology in the morning Admission status: Inpatient  Severity of Illness: The appropriate patient status for this patient is INPATIENT. Inpatient status is judged to be reasonable and necessary in order to provide the required intensity of service to ensure the patient's safety. The patient's presenting  symptoms, physical exam findings, and initial radiographic and laboratory data in the context of their chronic comorbidities is felt to place them at high risk for further clinical deterioration. Furthermore, it is not anticipated that the patient will be medically stable for discharge from the hospital within 2 midnights of admission. The following factors support the patient status of inpatient.   " The patient's presenting symptoms include worsening lower extremity edema. " The worrisome physical exam findings include lower extremity edema and coarse breath sounds with crackles. " The initial radiographic and laboratory data are worrisome because of mild pulmonary edema. " The chronic co-morbidities include CHF with A. fib.   * I certify that at the point of admission it is my clinical judgment that the patient will require inpatient hospital care spanning beyond 2 midnights from the point of admission due to high intensity of service, high risk for further deterioration and high frequency of surveillance required.Lonia Blood MD [facility] Pager [phone]  If 7PM-7AM, please contact night-coverage [email or web address] Password [email or web address]  05/06/2019, 1:01 AM

## 2019-05-06 NOTE — Progress Notes (Signed)
Patient ID: Kari [name], female   DOB: [birth year], 86 y.o.   MRN: [id number] Patient was admitted early this morning for worsening lower extremity edema along with shortness of breath and started on IV Lasix for CHF exacerbation.  Patient seen and examined at bedside and plan of care discussed with her.  Patient is currently wheezing.  Discussed CODE STATUS with her and she is okay with being a DNR.  I have reviewed patient medical records including this morning's H&P, current vitals, labs and medications myself.  Will get stat labs this morning.  Will consult cardiology.  Repeat a.m. labs.  Strict input and output.  Fluid restriction.  Follow 2D echo.  Will give intravenous Solu-Medrol because of wheezing.

## 2019-05-06 NOTE — Progress Notes (Signed)
PT Cancellation Note  Patient Details Name: Kari [name] MRN: [id number] DOB: [birth year]   Cancelled Treatment:    Reason Eval/Treat Not Completed: Medical issues which prohibited therapy Nursing reports pt with increased RR and labored breathing.  Will hold PT at this time.  Will f/u as able. Royetta Asal, PT Acute Rehab Services Pager [phone] [facility] [phone] [facility] [phone]    Rayetta Humphrey 05/06/2019, 4:58 PM

## 2019-05-06 NOTE — ED Notes (Signed)
Pt had episode of urine incontinence and purewick was moved by pt. RN cleaned pt, changed linens, and placed a new purewick on pt. Pt given warm blankets and resting comfortably at this time.

## 2019-05-06 NOTE — Progress Notes (Signed)
  Echocardiogram 2D Echocardiogram has been performed.  Sherrin Stahle G Deandre Stansel 05/06/2019, 2:52 PM

## 2019-05-06 NOTE — Consult Note (Addendum)
Cardiology Consultation:   Patient ID: Kari [name] MRN: [id number]; DOB: 06/12/1928  Admit date: 05/05/2019 Date of Consult: 05/06/2019  Primary Care Provider: Earlie Server, MD Primary Cardiologist: Tobias Alexander, MD  Primary Electrophysiologist:  [name], MD    Patient Profile:   Kari [name] is a 86 y.o. female with a hx of PAF, chronic diastolic CHF with pulmonary HTN and moderate-severe MR by echo 03/2018, chronic edema, mildly dilated aortic root by prior echo, arthritis, asthma, COPD on chronic oxygen, diverticulosis, GERD, hiatal hernia, scarlet fever, PACs, pulm/derm sarcoid, thyroid disease, Zenker diverticulum, dilated aortic root, dementia, anemia, Covid in 01/2019 who is being seen today for the evaluation of CHF at the request of Dr. Hanley Ben.  History of Present Illness:   She a history of a remote nuc in 2000 that was negative for reversible ischemia, noting diaphragmatic attenuation. She has had a Linq in place previously monitored by EP, but appears this dropped off in 2019 after she went to placement. She was admitted to [facility] in 03/2018 with acute on chronic diastolic CHF. 2D echo had shown EF 55-60%, moderate to moderately severe mitral regurgitation, moderately severe pulmonary HTN, mild AI/AS. Notes indicate she was in atrial fibrillation at that time (no specific mention of plan for rhythm control). Subsequent to this she has had mild worsening of kidney function and has followed by nephrology. She was last seen by nephrology in 03/2019 who referenced last available creatinine I can find at 1.32 in 02/2019. At that visit she was noted to have tachypnea and possible pleural effusions. Note stated, "Most likely will be increasing the patient's metolazone to 5 mg daily at least for several days. I will see the patient back in 3 months." No further notes available although nursing home MAG includes Bumex 2mg  TID and metolazone 5mg  BID. Spironolactone and losartan  previously on [facility] but listed as discontinued by provider and no longer taking prior to admission  She presented to ED from [facility] with increased bilateral lower extremity edema and decreased UOP for at least several days' time. She also has wheezing and shortness of breath. She denies any CP, chills, cough, syncope or bleeding. She is found to have AKI on CKD stage III with BUN 42, Cr 2.42, hypokalemia, anemia (Hgb 9-10 range similar to baseline), hsTroponins 26-25-26. CXR showed mild pulmonary vascular congestion, no evidence of frank edema. She received 40mg  IV Lasix yesterday and is written for 20mg  IV BID. She also received a dose of Solu Medrol for significant wheezing today. Disorientation noted overnight, although she's currently A+Ox3 except the specific date. She has not yet had weight or I/O's since admission. She reports her breathing is becoming more short than it was. No family currently at bedside.  Past Medical History:  Diagnosis Date  . Abdominal pain   . Arthritis   . Asthma   . Chronic diastolic CHF (congestive heart failure) (HCC)   . Chronic respiratory failure (HCC)    a. on 2L home O2 chronically.  . Complication of anesthesia    BP drops  . COPD (chronic obstructive pulmonary disease) (HCC)   . COVID-19 virus infection 01/2019  . Dilated aortic root (HCC)   . Diverticulosis   . GERD (gastroesophageal reflux disease)   . Helicobacter pylori gastritis 11/2011  . Hiatal hernia   . History of echocardiogram    Echo 3/17: Mild LVH, EF 55-60%, mild aortic stenosis (peak 10 mmHg), mild MR, mild LAE  . History of gallstones   .  History of scarlet fever   . MI, old   . Mild AI (aortic insufficiency)   . Mild aortic stenosis   . Mitral regurgitation   . Osteoporosis   . PAF (paroxysmal atrial fibrillation) (HCC)   . Premature atrial contractions   . Pulmonary hypertension (HCC)   . Sarcoid    pulmonary and dermatologic  . Sarcoidosis, lung (HCC)   .  Thyroid disease   . Zenker's diverticulum     Past Surgical History:  Procedure Laterality Date  . ABDOMINAL HYSTERECTOMY  10/1997  . cardiolite  2000  . CARDIOVERSION N/A 02/29/2016   Procedure: CARDIOVERSION;  Surgeon: Jake Bathe, MD;  Location: [facility] ENDOSCOPY;  Service: Cardiovascular;  Laterality: N/A;  . CARDIOVERSION N/A 10/19/2016   Procedure: CARDIOVERSION;  Surgeon: [name], MD;  Location: [facility] ENDOSCOPY;  Service: Cardiovascular;  Laterality: N/A;  . CATARACT EXTRACTION Bilateral   . CHOLECYSTECTOMY  04/1996  . COLONOSCOPY    . EP IMPLANTABLE DEVICE N/A 07/23/2014   Procedure: Loop Recorder Insertion;  Surgeon: [name], MD;  Location: [facility] INVASIVE CV LAB;  Service: Cardiovascular;  Laterality: N/A;  . EYE SURGERY Right 11/2015  . HERNIA REPAIR     inguinal and hiatal/abdominal  . LEG SURGERY     right leg...rod/screws  . THYROIDECTOMY       Home Medications:  Prior to Admission medications   Medication Sig Start Date End Date Taking? Authorizing Provider  acetaminophen (TYLENOL) 325 MG tablet Take 650 mg by mouth every 4 (four) hours as needed (for pain or elevated temperature).   Yes [provider]  albuterol (PROVENTIL HFA;VENTOLIN HFA) 108 (90 BASE) MCG/ACT inhaler Inhale 2 puffs into the lungs every 4 (four) hours as needed for shortness of breath.    Yes [provider]  Aromatic Inhalants (VICKS BABYRUB) OINT Apply 1 application topically every 8 (eight) hours as needed (for cough and/or congestion).   Yes [provider]  bisacodyl (DULCOLAX) 10 MG suppository Place 10 mg rectally daily as needed (for constipation).   Yes [provider]  bumetanide (BUMEX) 2 MG tablet Take 2 mg by mouth See admin instructions. Take 2 mg by mouth three times a day (30 minutes after Metolazone)   Yes [provider]  Carboxymethylcell-Glycerin PF (REFRESH OPTIVE PF) 0.5-0.9 % SOLN Place 1 drop into both eyes every 8 (eight) hours  as needed (for dry eyes).   Yes [provider]  clonazePAM (KLONOPIN) 0.5 MG tablet Take 0.25 mg by mouth 3 (three) times daily.   Yes [provider]  Dextran 70-Hypromellose (ARTIFICIAL TEARS PF OP) Place 1 drop into both eyes every 8 (eight) hours as needed (for dry eyes).   Yes [provider]  ELIQUIS 5 MG TABS tablet TAKE 1 TABLET (5 MG TOTAL) BY MOUTH 2 (TWO) TIMES DAILY. Patient taking differently: Take 5 mg by mouth 2 (two) times daily.  10/23/17  Yes Lars Masson, MD  ferrous sulfate 325 (65 FE) MG tablet Take 325 mg by mouth daily with breakfast.   Yes [provider]  guaiFENesin (MUCINEX) 600 MG 12 hr tablet Take 600 mg by mouth 2 (two) times daily.   Yes [provider]  guaifenesin (ROBITUSSIN) 100 MG/5ML syrup Take 200 mg by mouth every 4 (four) hours as needed for cough.   Yes [provider]  hydrALAZINE (APRESOLINE) 10 MG tablet Take 20 mg by mouth See admin instructions. Take 20 mg (2 tablets) by mouth two times  a day and hold for a a B/P <110/60   Yes [provider]  ipratropium-albuterol (DUONEB) 0.5-2.5 (3) MG/3ML SOLN Take 3 mLs by nebulization every 6 (six) hours.   Yes [provider]  levothyroxine (SYNTHROID) 175 MCG tablet Take 175 mcg by mouth daily before breakfast.   Yes [provider]  magnesium hydroxide (MILK OF MAGNESIA) 400 MG/5ML suspension Take 30 mLs by mouth daily as needed for mild constipation.   Yes [provider]  memantine (NAMENDA) 10 MG tablet Take 10 mg by mouth 2 (two) times daily.   Yes [provider]  metolazone (ZAROXOLYN) 5 MG tablet Take 5 mg by mouth 2 (two) times daily.   Yes [provider]  metoprolol succinate (TOPROL-XL) 50 MG 24 hr tablet Take 50 mg by mouth daily. Take with or immediately following a meal.   Yes [provider]  omeprazole (PRILOSEC) 20 MG capsule Take 20 mg by mouth in the morning.   Yes [provider]  ondansetron (ZOFRAN) 4 MG tablet Take 4 mg by mouth every 8 (eight) hours as needed for nausea or vomiting.   Yes [provider]  polyethylene glycol powder (GLYCOLAX/MIRALAX) 17 GM/SCOOP powder Take 17 g by mouth See admin instructions. Mix 17 grams into 4-6 ounces of liquid and drink once a day   Yes [provider]  potassium chloride SA (KLOR-CON) 20 MEQ tablet Take 40 mEq by mouth 2 (two) times daily.   Yes [provider]  rOPINIRole (REQUIP) 3 MG tablet Take 3 mg by mouth at bedtime.   Yes [provider]  Skin Protectants, Misc. (EUCERIN) cream Apply 1 application topically See admin instructions. Apply to both legs 3 times a day   Yes [provider]  losartan (COZAAR) 50 MG tablet Take 1 tablet (50 mg total) by mouth daily. Patient not taking: Reported on 05/06/2019 01/19/17 05/06/19  Thompson Grayer, MD  spironolactone (ALDACTONE) 25 MG tablet Take 0.5 tablets (12.5 mg total) by mouth daily. Patient not taking: Reported on 05/06/2019 02/27/17 05/06/19  Dorothy Spark, MD    Inpatient Medications: Scheduled Meds: . apixaban  2.5 mg Oral BID  . furosemide  20 mg Intravenous BID  . methylPREDNISolone (SOLU-MEDROL) injection  40 mg Intravenous Q12H  . sodium chloride flush  3 mL Intravenous Once  . sodium chloride flush  3 mL Intravenous Q12H   Continuous Infusions: . sodium chloride     PRN Meds: sodium chloride, acetaminophen, ipratropium-albuterol, ondansetron (ZOFRAN) IV, sodium chloride flush  Allergies:    Allergies  Allergen Reactions  . Advair Diskus [Fluticasone-Salmeterol] Other (See Comments)    Severe muscle pain, Couldn't walk at all  . Aspirin Shortness Of Breath, Nausea Only, Swelling and Other (See Comments)    ONLY IN HIGH DOSES- stomach pain, also  . Dulera [Mometasone Furo-Formoterol Fum] Other (See Comments)    Caused A-FIB  . Ivp Dye [Iodinated Diagnostic Agents]     Pt states she thinks she had  BP drop-unsure---Pre Treatment pt states she does fine  . Losartan Potassium Shortness Of Breath and Swelling  . Propofol Other (See Comments)    Severely prolonged altered mental status and increased dementia   . Amitriptyline Other (See Comments)    Couldn't sleep  . Budesonide-Formoterol Fumarate Other (See Comments)    Leg cramps and Visual deficit  . Codeine Nausea And Vomiting, Palpitations and Other (See Comments)    Causes arrhythmia(s) and "goes crazy"  . Ketorolac  Tromethamine Swelling and Other (See Comments)    Angioedema   . Oxycodone-Acetaminophen Other (See Comments)    Confusion  . Sulfasalazine Other (See Comments)    Unknown reaction  . Acetaminophen Other (See Comments)    Reaction ??  . Alprazolam Other (See Comments)    "Allergic," per MAR  . Amitriptyline Hcl Other (See Comments)    "Could not sleep"- "allergic," per MAR  . Amlodipine Besylate Swelling  . Azithromycin Other (See Comments)    Irreg pulse  . Fluticasone Furoate-Vilanterol Other (See Comments)    Severe eye issues and saw flashing lights  . Iodine Other (See Comments)    "Allergic," per MAR, possible hypotenison  . Morphine And Related Hives and Other (See Comments)    Caused arrhythmias and "goes crazy"  . Other Hives and Other (See Comments)    Opioids - Morphine Analogues- Narcotics - reverse effect on her, couldn't sleep, arrhythmias  . Oxycodone Hcl Other (See Comments)    Confusion   . Prevacid [Lansoprazole] Other (See Comments)    Unknown   . Tramadol Hives  . Morphine Palpitations and Other (See Comments)    Tachycardia    Social History:   Social History   Socioeconomic History  . Marital status: Divorced    Spouse name: Not on file  . Number of children: 4  . Years of education: Not on file  . Highest education level: Not on file  Occupational History  . Occupation: clergy  Tobacco Use  . Smoking status: Former Smoker    Packs/day: 2.00    Years: 10.00    Pack  years: 20.00    Types: Cigarettes, Pipe    Quit date: 01/25/1968    Years since quitting: 51.3  . Smokeless tobacco: Never Used  . Tobacco comment: pt unsure of exact month in 1977  Substance and Sexual Activity  . Alcohol use: Yes    Alcohol/week: 0.0 standard drinks    Comment: seldom...glass wine  . Drug use: No  . Sexual activity: Not on file  Other Topics Concern  . Not on file  Social History Narrative   ** Merged History Encounter **       Social Determinants of Health   Financial Resource Strain:   . Difficulty of Paying Living Expenses:   Food Insecurity:   . Worried About Programme researcher, broadcasting/film/video in the Last Year:   . Barista in the Last Year:   Transportation Needs:   . Freight forwarder (Medical):   Marland Kitchen Lack of Transportation (Non-Medical):   Physical Activity:   . Days of Exercise per Week:   . Minutes of Exercise per Session:   Stress:   . Feeling of Stress :   Social Connections:   . Frequency of Communication with Friends and Family:   . Frequency of Social Gatherings with Friends and Family:   . Attends Religious Services:   . Active Member of Clubs or Organizations:   . Attends Banker Meetings:   Marland Kitchen Marital Status:   Intimate Partner Violence:   . Fear of Current or Ex-Partner:   . Emotionally Abused:   Marland Kitchen Physically Abused:   . Sexually Abused:     Family History:   Family History  Problem Relation Age of Onset  . Diabetes Father   . Diabetes Daughter   . Heart disease Mother   . Diabetes Brother   . Other Sister  scarlet fever  . Rheumatic fever Sister   . Colon cancer Neg Hx      ROS:  Please see the history of present illness.  All other ROS reviewed and negative.     Physical Exam/Data:    Vital Signs. BP 114/71   Pulse 78   Temp 98 F (36.7 C)   Resp 19   Ht 5\' 4"  (1.626 m)   Wt 80 kg   SpO2 100%   BMI 30.27 kg/m  General: Obese WF with increased WOB Head: Normocephalic, atraumatic, sclera  non-icteric, no xanthomas, nares are without discharge. Neck: Negative for carotid bruits. JVP difficult to assess in current resting position obtaining echocardiogram Lungs: Diffuse wheezing noted with shallow breathing and mild accessory muscle use Heart: Rate controlled, slightly irregular rhythm, S1 S2 without murmurs, rubs, or gallops.  Abdomen: Soft, non-tender, non-distended with normoactive bowel sounds. No rebound/guarding. Extremities: No clubbing or cyanosis. Soft bilateral pale puffy lower extremities but without marked pitting Neuro: Alert and oriented X 3. Moves all extremities spontaneously. Psych:  Responds to questions appropriately with a normal affect.   EKG:  The EKG was personally reviewed and demonstrates:  Baseline artifact degrades quality but tracing from 22:55 appears to be atrial fibrillation with left axis deviation, nonspecific STT Changes  Telemetry:  Telemetry was personally reviewed and demonstrates: suspected atrial fibrillation  Relevant CV Studies: 2D echo 03/2018 The left ventricle is normal in size, wall thickness and wall motion with ejection fraction of 55-60%. The left ventricular diastolic function is normal. The left atrium is mildly dilated. The right atrium is moderately dilated. The tricuspid valve leaflets are structurally normal with moderate to moderately severe (2-3+) regurgitation. Right ventricular systolic pressure is elevated between 50-50mm Hg, consistent with moderately severe pulmonary hypertension. The aortic valve is trileaflet. There is severe aortic valve thickening with mild regurgitation. There is mild aortic stenosis (aortic valve area >1.5cm2).  Laboratory Data:  High Sensitivity Troponin:   Recent Labs  Lab 05/05/19 2211 05/06/19 0145 05/06/19 0959  TROPONINIHS 26* 25* 26*     Chemistry Recent Labs  Lab 05/05/19 1920 05/06/19 0959  NA 135 143  K 3.1* 3.3*  CL 86* 89*  CO2 35* 42*  GLUCOSE 116* 103*  BUN  42* 42*  CREATININE 2.42* 1.94*  CALCIUM 8.3* 8.3*  GFRNONAA 17* 22*  GFRAA 20* 26*  ANIONGAP 14 12    Recent Labs  Lab 05/06/19 0959  PROT 6.6  ALBUMIN 3.6  AST 15  ALT 11  ALKPHOS 64  BILITOT 0.9   Hematology Recent Labs  Lab 05/05/19 1920 05/06/19 0959  WBC 8.0 7.9  RBC 3.50* 3.18*  HGB 10.5* 9.6*  HCT 34.2* 31.4*  MCV 97.7 98.7  MCH 30.0 30.2  MCHC 30.7 30.6  RDW 15.3 15.3  PLT 288 251   BNP Recent Labs  Lab 05/05/19 2211  BNP 363.0*    DDimer No results for input(s): DDIMER in the last 168 hours.   Radiology/Studies:  DG Chest 2 View  Result Date: 05/05/2019 CLINICAL DATA:  Edema complains of leg swelling EXAM: CHEST - 2 VIEW COMPARISON:  01/08/2016 FINDINGS: Cardiac loop recorder projects over left heart. Heart size remains enlarged. Stable when compared to the previous exam. Signs of hiatal hernia as before. Lungs with mild pulmonary vascular congestion, no consolidation or evidence of frank edema. No pleural fluid. Signs of prior trauma to the right humerus and right chest wall. IMPRESSION: Cardiomegaly with mild pulmonary vascular congestion. No evidence  of frank edema. Electronically Signed   By: Donzetta KohutGeoffrey  Wile M.D.   On: 05/05/2019 19:35   {   Assessment and Plan:   1. Shortness of breath and wheezing - likely multifactorial given COPD  +/- CHF.   2. Potential AECOPD - now on steroids, increased WOB. Likely needs escalation of pulmonary toilet.  3. Acute on chronic diastolic CHF, compounded by AKI on CKD stage III - will discuss diuretic plan with MD. Need to follow I/Os and daily weights. Cr 2.42->1.9.  4. Pulmonary HTN with moderate severe MR and mild AS/AI by echo 03/2018 - await echo.  5. Paroxysmal atrial fibrillation - possibly persistent at this point, given that she was reported to be in afib in March 2020. Will request [employer] loop recorder interrogation to clarify. Eliquis dose has been adjusted for renal dysfunction.   6. Chronic  appearing anemia - will need to trend given Eliquis. Was normal in 2018 but documented in the 9-10 range in 2020.  For questions or updates, please contact [facility] Please consult [email or web address] for contact info under     Signed, Laurann MontanaDayna N Dunn, PA-C  05/06/2019 2:29 PM As above, patient seen and examined.  Briefly she is an 86 year old female with past medical history of persistent atrial fibrillation, chronic diastolic congestive heart failure, moderate to severe mitral regurgitation by prior echocardiogram, COPD on chronic home oxygen, sarcoid for evaluation of acute on chronic diastolic congestive heart failure.  [facility] showed normal LV function, moderate to moderately severe mitral regurgitation, moderately severe pulmonary hypertension, mild aortic insufficiency/aortic stenosis.  Patient complains of dyspnea for approximately 1 year.  She is on 2 L of oxygen at home.  Her symptoms have worsened particularly over the past several days.  She denies orthopnea.  She has had mild increased pedal edema.  She denies chest pain, fevers, chills or productive cough.  Her dyspnea worsened and she presented to the emergency room.  Cardiology now asked to evaluate.  BNP 363.  Troponin I 26, 25 and 26.  BUN 42 and creatinine 1.94.  Electrocardiogram shows atrial fibrillation with no ST changes.  Chest x-ray shows cardiomegaly with mild pulmonary vascular congestion.  1 acute on chronic diastolic congestive heart failure-patient does not appear to be significantly volume overloaded on examination.  BNP minimally elevated.  We will diurese with Lasix 40 mg IV twice daily and follow renal function.  Await follow-up echocardiogram.  I reviewed preliminary images and LV function appears to be normal.  2 COPD-on exam she has significantly diminished breath sounds and also expiratory wheezing.  There appears to be a significant component of COPD exacerbation.  I agree  with pulmonary toilet including steroids and bronchodilators.  Management per primary care.  3 acute on chronic stage III kidney disease-follow renal function closely with diuresis.  4 persistent atrial fibrillation-patient is on Toprol for rate control at home.  Given COPD would discontinue.  Follow heart rate and add Cardizem as needed for rate control.  Continue apixaban at 2.5 mg twice daily.  5 question history of moderate to severe mitral regurgitation-we will repeat echocardiogram to reassess.  6 hypokalemia-supplement  7 no CODE BLUE.   Olga MillersBrian Nina Mondor

## 2019-05-06 NOTE — ED Notes (Signed)
Lunch Tray Ordered @ 1116. 

## 2019-05-06 NOTE — ED Notes (Signed)
Breakfast Ordered 

## 2019-05-06 NOTE — ED Notes (Signed)
Daughter, Marcelino Duster would like an update [phone]

## 2019-05-06 NOTE — ED Notes (Signed)
Pt disoriented and yelling out. RN comforted pt and pt calmed down and is now back asleep.

## 2019-05-07 DIAGNOSIS — N179 Acute kidney failure, unspecified: Secondary | ICD-10-CM | POA: Diagnosis not present

## 2019-05-07 DIAGNOSIS — N183 Chronic kidney disease, stage 3 unspecified: Secondary | ICD-10-CM | POA: Diagnosis not present

## 2019-05-07 DIAGNOSIS — I4821 Permanent atrial fibrillation: Secondary | ICD-10-CM

## 2019-05-07 DIAGNOSIS — J441 Chronic obstructive pulmonary disease with (acute) exacerbation: Secondary | ICD-10-CM | POA: Diagnosis not present

## 2019-05-07 LAB — CBC
HCT: 32.6 % — ABNORMAL LOW (ref 36.0–46.0)
Hemoglobin: 10 g/dL — ABNORMAL LOW (ref 12.0–15.0)
MCH: 29.8 pg (ref 26.0–34.0)
MCHC: 30.7 g/dL (ref 30.0–36.0)
MCV: 97 fL (ref 80.0–100.0)
Platelets: 260 10*3/uL (ref 150–400)
RBC: 3.36 MIL/uL — ABNORMAL LOW (ref 3.87–5.11)
RDW: 14.7 % (ref 11.5–15.5)
WBC: 7 10*3/uL (ref 4.0–10.5)
nRBC: 0 % (ref 0.0–0.2)

## 2019-05-07 LAB — BASIC METABOLIC PANEL
Anion gap: 13 (ref 5–15)
BUN: 49 mg/dL — ABNORMAL HIGH (ref 8–23)
CO2: 42 mmol/L — ABNORMAL HIGH (ref 22–32)
Calcium: 8.7 mg/dL — ABNORMAL LOW (ref 8.9–10.3)
Chloride: 86 mmol/L — ABNORMAL LOW (ref 98–111)
Creatinine, Ser: 1.53 mg/dL — ABNORMAL HIGH (ref 0.44–1.00)
GFR calc Af Amer: 35 mL/min — ABNORMAL LOW (ref >60)
GFR calc non Af Amer: 30 mL/min — ABNORMAL LOW (ref >60)
Glucose, Bld: 172 mg/dL — ABNORMAL HIGH (ref 70–99)
Potassium: 3.8 mmol/L (ref 3.5–5.1)
Sodium: 141 mmol/L (ref 135–145)

## 2019-05-07 MED ORDER — METHYLPREDNISOLONE SODIUM SUCC 40 MG IJ SOLR
40.0000 mg | Freq: Three times a day (TID) | INTRAMUSCULAR | Status: DC
  Administered 2019-05-07 – 2019-05-10 (×9): 40 mg via INTRAVENOUS
  Filled 2019-05-07 (×10): qty 1

## 2019-05-07 MED ORDER — IPRATROPIUM-ALBUTEROL 0.5-2.5 (3) MG/3ML IN SOLN
3.0000 mL | Freq: Four times a day (QID) | RESPIRATORY_TRACT | Status: DC
  Administered 2019-05-07 – 2019-05-09 (×9): 3 mL via RESPIRATORY_TRACT
  Filled 2019-05-07 (×10): qty 3

## 2019-05-07 NOTE — Progress Notes (Addendum)
Progress Note  Patient Name: Kari [name] Date of Encounter: 05/07/2019  Primary Cardiologist: Tobias Alexander, MD   Subjective   Patient noticeably short of breath with audible wheezing. She is asking for another breathing treatment. No chest pain.   Inpatient Medications    Scheduled Meds: . apixaban  2.5 mg Oral BID  . furosemide  40 mg Intravenous BID  . methylPREDNISolone (SOLU-MEDROL) injection  40 mg Intravenous Q12H  . sodium chloride flush  3 mL Intravenous Once  . sodium chloride flush  3 mL Intravenous Q12H   Continuous Infusions: . sodium chloride     PRN Meds: sodium chloride, acetaminophen, ipratropium-albuterol, ondansetron (ZOFRAN) IV, sodium chloride flush   Vital Signs    Vitals:   05/07/19 0051 05/07/19 0335 05/07/19 0341 05/07/19 0751  BP: 114/66 101/66  (!) 139/45  Pulse: 81 78  76  Resp: 20 20    Temp: 98.1 F (36.7 C) 97.7 F (36.5 C)  97.7 F (36.5 C)  TempSrc: Oral Oral  Oral  SpO2: 96% 96%  95%  Weight:   90.6 kg   Height:        Intake/Output Summary (Last 24 hours) at 05/07/2019 0902 Last data filed at 05/07/2019 0821 Gross per 24 hour  Intake 592 ml  Output 601 ml  Net -9 ml   Last 3 Weights 05/07/2019 05/05/2019 10/19/2016  Weight (lbs) 199 lb 11.2 oz 176 lb 5.9 oz 179 lb  Weight (kg) 90.583 kg 80 kg 81.194 kg  Some encounter information is confidential and restricted. Go to Review Flowsheets activity to see all data.      Telemetry    Atrial fibrillation with rates in the 70's to 80's. - Personally Reviewed  ECG    No new ECG tracing today. - Personally Reviewed  Physical Exam   GEN: No acute distress. Neck: Supple. JVD difficult to assess due to patient's current resting position. Cardiac: Irregularly irregular rhythm. No murmurs, rubs, or gallops appreciated. Respiratory: Mild increased work of breathing with audible wheezing. Diffuse wheezing noted on exam. No significant crackles.  GI: Soft, non-distended, and  non-tender.  MS: Non-pitting edema of bilateral lower extremities (possibly just adipose and not true edema).No deformity. Neuro:  No focal deficits. Psych: Normal affect.  Labs    High Sensitivity Troponin:   Recent Labs  Lab 05/05/19 2211 05/06/19 0145 05/06/19 0959  TROPONINIHS 26* 25* 26*      Chemistry Recent Labs  Lab 05/05/19 1920 05/06/19 0959 05/07/19 0455  NA 135 143 141  K 3.1* 3.3* 3.8  CL 86* 89* 86*  CO2 35* 42* 42*  GLUCOSE 116* 103* 172*  BUN 42* 42* 49*  CREATININE 2.42* 1.94* 1.53*  CALCIUM 8.3* 8.3* 8.7*  PROT  --  6.6  --   ALBUMIN  --  3.6  --   AST  --  15  --   ALT  --  11  --   ALKPHOS  --  64  --   BILITOT  --  0.9  --   GFRNONAA 17* 22* 30*  GFRAA 20* 26* 35*  ANIONGAP 14 12 13      Hematology Recent Labs  Lab 05/05/19 1920 05/06/19 0959 05/07/19 0455  WBC 8.0 7.9 7.0  RBC 3.50* 3.18* 3.36*  HGB 10.5* 9.6* 10.0*  HCT 34.2* 31.4* 32.6*  MCV 97.7 98.7 97.0  MCH 30.0 30.2 29.8  MCHC 30.7 30.6 30.7  RDW 15.3 15.3 14.7  PLT 288 251 260  BNP Recent Labs  Lab 05/05/19 2211  BNP 363.0*     DDimer No results for input(s): DDIMER in the last 168 hours.   Radiology    DG Chest 2 View  Result Date: 05/05/2019 CLINICAL DATA:  Edema complains of leg swelling EXAM: CHEST - 2 VIEW COMPARISON:  01/08/2016 FINDINGS: Cardiac loop recorder projects over left heart. Heart size remains enlarged. Stable when compared to the previous exam. Signs of hiatal hernia as before. Lungs with mild pulmonary vascular congestion, no consolidation or evidence of frank edema. No pleural fluid. Signs of prior trauma to the right humerus and right chest wall. IMPRESSION: Cardiomegaly with mild pulmonary vascular congestion. No evidence of frank edema. Electronically Signed   By: Donzetta KohutGeoffrey  Wile M.D.   On: 05/05/2019 19:35   ECHOCARDIOGRAM COMPLETE  Result Date: 05/06/2019    ECHOCARDIOGRAM REPORT   Patient Name:   Kari GroveLZIA [name] Date of Exam: 05/06/2019  Medical Rec #:  [id number]      Height:       64.0 in Accession #:    [id number]     Weight:       176.4 lb Date of Birth:  01/09/1929      BSA:          1.855 m Patient Age:    89 years       BP:           116/62 mmHg Patient Gender: F              HR:           86 bpm. Exam Location:  Inpatient Procedure: 2D Echo, Cardiac Doppler and Color Doppler Indications:    I50.33 Acute on chronic diastolic (congestive) heart failure  History:        Patient has prior history of Echocardiogram examinations, most                 recent 10/26/2016. COPD; Arrythmias:Atrial Fibrillation.                 Sarcoidosis. GERD. Thyroid Disease. Dilated Aortic Root.  Sonographer:    Elmarie Shileyiffany Dance Referring Phys: [id number] MOHAMMAD L GARBA IMPRESSIONS  1. Left ventricular ejection fraction, by estimation, is 55 to 60%. The left ventricle has normal function. The left ventricle has no regional wall motion abnormalities. There is mild left ventricular hypertrophy. Left ventricular diastolic parameters are indeterminate.  2. Right ventricular systolic function is normal. The right ventricular size is normal. There is moderately elevated pulmonary artery systolic pressure.  3. Left atrial size was moderately dilated.  4. Right atrial size was moderately dilated.  5. The mitral valve is myxomatous. Mild mitral valve regurgitation. No evidence of mitral stenosis.  6. Mean gradient consistent with aortic valve sclerosis. However, leaflet movement is restricted and there appears to be at least mild aortic stenosis. The left and right coronary cusps are nearly fixed. The aortic valve is abnormal. Aortic valve regurgitation is moderate. Mild to moderate aortic valve sclerosis/calcification is present, without any evidence of aortic stenosis. Aortic regurgitation PHT measures 319 msec.  7. Aortic dilatation noted. There is mild dilatation of the ascending aorta measuring 40 mm.  8. The inferior vena cava is normal in size with <50% respiratory  variability, suggesting right atrial pressure of 8 mmHg. FINDINGS  Left Ventricle: Left ventricular ejection fraction, by estimation, is 55 to 60%. The left ventricle has normal function. The left ventricle has no regional wall motion abnormalities. The left ventricular  internal cavity size was normal in size. There is  mild left ventricular hypertrophy. Left ventricular diastolic parameters are indeterminate. Right Ventricle: The right ventricular size is normal. No increase in right ventricular wall thickness. Right ventricular systolic function is normal. There is moderately elevated pulmonary artery systolic pressure. The tricuspid regurgitant velocity is 3.12 m/s, and with an assumed right atrial pressure of 8 mmHg, the estimated right ventricular systolic pressure is 98.3 mmHg. Left Atrium: Left atrial size was moderately dilated. Right Atrium: Right atrial size was moderately dilated. Pericardium: There is no evidence of pericardial effusion. Mitral Valve: The mitral valve is myxomatous. Normal mobility of the mitral valve leaflets. Mild mitral annular calcification. Mild mitral valve regurgitation. No evidence of mitral valve stenosis. Tricuspid Valve: The tricuspid valve is normal in structure. Tricuspid valve regurgitation is mild . No evidence of tricuspid stenosis. Aortic Valve: Mean gradient consistent with aortic valve sclerosis. However, leaflet movement is restricted and there appears to be at least mild aortic stenosis. The left and right coronary cusps are nearly fixed. The aortic valve is abnormal. . There is moderate thickening and moderate calcification of the aortic valve. Aortic valve regurgitation is moderate. Aortic regurgitation PHT measures 319 msec. Mild to moderate aortic valve sclerosis/calcification is present, without any evidence of aortic stenosis. There is moderate thickening of the aortic valve. There is moderate calcification of the aortic valve. Aortic valve mean gradient  measures 6.0 mmHg. Aortic valve peak gradient measures 11.4 mmHg. Aortic valve area, by VTI measures 2.21 cm. Pulmonic Valve: The pulmonic valve was normal in structure. Pulmonic valve regurgitation is mild. No evidence of pulmonic stenosis. Aorta: The aortic root is normal in size and structure and aortic dilatation noted. There is mild dilatation of the ascending aorta measuring 40 mm. Venous: The inferior vena cava is normal in size with less than 50% respiratory variability, suggesting right atrial pressure of 8 mmHg. IAS/Shunts: No atrial level shunt detected by color flow Doppler.  LEFT VENTRICLE PLAX 2D LVIDd:         4.20 cm LVIDs:         2.70 cm LV PW:         1.27 cm LV IVS:        0.90 cm LVOT diam:     2.30 cm LV SV:         87 LV SV Index:   47 LVOT Area:     4.15 cm  RIGHT VENTRICLE             IVC RV Basal diam:  2.80 cm     IVC diam: 3.30 cm RV S prime:     10.40 cm/s TAPSE (M-mode): 1.8 cm LEFT ATRIUM             Index       RIGHT ATRIUM           Index LA diam:        4.20 cm 2.26 cm/m  RA Area:     22.90 cm LA Vol (A2C):   76.5 ml 41.25 ml/m RA Volume:   67.40 ml  36.34 ml/m LA Vol (A4C):   51.1 ml 27.55 ml/m LA Biplane Vol: 62.9 ml 33.92 ml/m  AORTIC VALVE AV Area (Vmax):    2.15 cm AV Area (Vmean):   2.16 cm AV Area (VTI):     2.21 cm AV Vmax:           169.00 cm/s AV Vmean:  119.000 cm/s AV VTI:            0.396 m AV Peak Grad:      11.4 mmHg AV Mean Grad:      6.0 mmHg LVOT Vmax:         87.55 cm/s LVOT Vmean:        61.800 cm/s LVOT VTI:          0.211 m LVOT/AV VTI ratio: 0.53 AI PHT:            319 msec  AORTA Ao Root diam: 3.60 cm Ao Asc diam:  3.90 cm MITRAL VALVE                TRICUSPID VALVE MV Area (PHT): 4.26 cm     TR Peak grad:   38.9 mmHg MV Decel Time: 178 msec     TR Vmax:        312.00 cm/s MV E velocity: 158.50 cm/s                             SHUNTS                             Systemic VTI:  0.21 m                             Systemic Diam: 2.30 cm  [name] MD Electronically signed by [name] MD Signature Date/Time: 05/06/2019/6:32:31 PM    Final     Cardiac Studies   Echocardiogram 05/07/2019: 1. Left ventricular ejection fraction, by estimation, is 55 to 60%. The  left ventricle has normal function. The left ventricle has no regional  wall motion abnormalities. There is mild left ventricular hypertrophy.  Left ventricular diastolic parameters  are indeterminate.  2. Right ventricular systolic function is normal. The right ventricular  size is normal. There is moderately elevated pulmonary artery systolic  pressure.  3. Left atrial size was moderately dilated.  4. Right atrial size was moderately dilated.  5. The mitral valve is myxomatous. Mild mitral valve regurgitation. No  evidence of mitral stenosis.  6. Mean gradient consistent with aortic valve sclerosis. However, leaflet  movement is restricted and there appears to be at least mild aortic  stenosis. The left and right coronary cusps are nearly fixed. The aortic  valve is abnormal. Aortic valve  regurgitation is moderate. Mild to moderate aortic valve  sclerosis/calcification is present, without any evidence of aortic  stenosis. Aortic regurgitation PHT measures 319 msec.  7. Aortic dilatation noted. There is mild dilatation of the ascending  aorta measuring 40 mm.  8. The inferior vena cava is normal in size with <50% respiratory  variability, suggesting right atrial pressure of 8 mmHg.   Patient Profile   Ms. Rozanna Box is a 86 y.o. female with a history of PAF, chronic diastolic CHF with pulmonary hypertension and moderate-severe MR by Echo 03/2018, chronic edema,mildly dilated aortic root by prior Echo, arthritis, COPD/asthma on chronic oxygen, diverticulosis, GERD, hiatal hernia, scarlet fever, PACs, pulm/derm sarcoid, thyroid disease, Zenker diverticulum, dementia, anemia, Covid in 01/2019 who is being seen today for the evaluation of CHF at the  request of Dr. Hanley Ben.  Assessment & Plan    Acute on Chronic Diastolic CHF - Chest x-ray showed cardiomegaly with mild pulmonary vascular congestion but no frank edema.  -  BNP mildly elevated at 363.  - Echo showed LVEF of 55-60% with normal wall motion, mild MR, mild AS and moderate AI, biatrial enlargement, moderately elevated PASP, and mild dilatation of ascending aorta measuring 35mm.  - Currently on IV Lasix 40mg  twice daily. Documented urinary output of 601 mL since yesterday (do not know if this is accurate). Renal function improving.  - Patient does not appear significant volume overloaded on exam. Shortness of breath seems due to COPD exacerbation rather than CHF. - Will discuss Lasix dosing with MD. - Continue to monitor daily weights, strict I/O's, and renal function.   Persistent Atrial Fibrillation - Rate controlled.  - On Toprol-XL at home; however, this was discontinued yesterday due to severe COPD.  - Can add Cardizem for rate control if needed but OK right now.  - Continue Eliquis 2.5mg  twice daily.  Severe COPD on Home O2 - Diffuse wheezing on exam.  - Asked RN to contact Respiratory Therapy to give breathing treatment.  - Management per primary team.   Mitral Regurgitation - Echo this admission showed myxomatous valve with mild MR.  - Continue to monitor as outpatient.   Aortic Valve Disease - Echo this admission showed restricted movement of leaflets with at least mild AS. Left and right coronary cusps nearly fixed. Also note to have moderate AI. - Continue to monitor as outpatient.  AKI - Creatinine improving: 2.42 >> 1.94 >> 1.53. Baseline from 2019 was 0.7 to 0.8.   Hypokalemia - Potassium improving 3.1 >> 3.3. >> 3.8.  - Continue to monitor.   Otherwise, per primary team.    For questions or updates, please contact [facility] Please consult [email or web address] for contact info under    Signed, 2020, PA-C  05/07/2019, 9:02 AM   As  above, patient seen and examined.  She complains of dyspnea this morning but denies chest pain.  On exam she has severely diminished breath sounds and expiratory wheeze.  I think predominant issue is COPD.  She states her dyspnea improves with breathing treatments.  Her renal function is somewhat worse.  Echocardiogram shows normal LV function, probable mild aortic stenosis, moderate aortic insufficiency and mild mitral regurgitation.  I will hold Lasix for now.  Would continue therapy for COPD including steroids and bronchodilators.  We will continue to follow rate of atrial fibrillation.  Toprol discontinued due to COPD.  If rate increases will add Cardizem.  Continue apixaban.  05/09/2019, MD

## 2019-05-07 NOTE — Progress Notes (Addendum)
Patient ID: Kari [name], female   DOB: [birth year], 86 y.o.   MRN: [id number]  PROGRESS NOTE    Shanora Christensen  DVV:[id number] DOB: [birth year] DOA: 05/05/2019 PCP: Algernon Huxley, MD   Brief Narrative:  86 year old female with history of chronic diastolic CHF, COPD, chronic kidney disease stage III, hypothyroidism, valvular heart disease, GERD, sarcoidosis presented from skilled nursing facility with worsening lower extremity edema and shortness of breath.  On presentation, chest x-ray showed cardiomegaly with mild pulmonary vascular congestion with no frank edema.  She was admitted and started on IV Lasix.  Subsequently, Solu-Medrol was also started for wheezing.  Cardiology was consulted.  Assessment & Plan:   COPD exacerbation Chronic hypoxic respiratory failure on home oxygen -Still has significant wheezing.  Will increase Solu-Medrol to 40 mg IV every 8 hours.  Continue duo nebs.  Avoid steroid inhalers because of multiple allergies  -Oxygen supplementation as needed.  Currently on 2 L oxygen via nasal cannula -Incentive spirometry  Probable acute on chronic diastolic heart failure -Cardiology following.  Strict input output.  Daily weights.  Fluid restriction.  Echo showed EF of 55 to 60%.  Cardiology has discontinued IV Lasix.  Toprol has been discontinued due to COPD.  Paroxysmal A. Fib -Rate controlled.  Toprol discontinued by cardiology.  Continue Eliquis  Acute kidney injury on chronic kidney disease stage III -Renal function improving.  Monitor  History of sarcoidosis -Outpatient follow-up  Essential hypertension -Monitor blood pressure.  Generalized deconditioning -PT eval.  Patient will return back to SNF  DVT prophylaxis: Eliquis Code Status: DNR Family Communication: spoke to daughter/Milnette on phone on 05/07/2019 Disposition Plan: Currently still wheezing and not ready for discharge yet.  Discharge back to SNF in 1 to 2 days once clinically improved and  cleared by cardiology  Consultants: Cardiology  Procedures:  Echo Echocardiogram 05/07/2019: 1. Left ventricular ejection fraction, by estimation, is 55 to 60%. The  left ventricle has normal function. The left ventricle has no regional  wall motion abnormalities. There is mild left ventricular hypertrophy.  Left ventricular diastolic parameters  are indeterminate.  2. Right ventricular systolic function is normal. The right ventricular  size is normal. There is moderately elevated pulmonary artery systolic  pressure.  3. Left atrial size was moderately dilated.  4. Right atrial size was moderately dilated.  5. The mitral valve is myxomatous. Mild mitral valve regurgitation. No  evidence of mitral stenosis.  6. Mean gradient consistent with aortic valve sclerosis. However, leaflet  movement is restricted and there appears to be at least mild aortic  stenosis. The left and right coronary cusps are nearly fixed. The aortic  valve is abnormal. Aortic valve  regurgitation is moderate. Mild to moderate aortic valve  sclerosis/calcification is present, without any evidence of aortic  stenosis. Aortic regurgitation PHT measures 319 msec.  7. Aortic dilatation noted. There is mild dilatation of the ascending  aorta measuring 40 mm.  8. The inferior vena cava is normal in size with <50% respiratory  variability, suggesting right atrial pressure of 8 mmHg.   Antimicrobials: None   Subjective: Patient seen and examined at bedside.  She is a poor historian but feels slightly better.  Breathing is slightly better as per her.  Denies current chest pain, overnight fever or vomiting.  Objective: Vitals:   05/07/19 0751 05/07/19 0935 05/07/19 1145 05/07/19 1153  BP: (!) 139/45  122/66   Pulse: 76  83 83  Resp:   (!) 21 20  Temp: 97.7 F (  36.5 C)  97.8 F (36.6 C)   TempSrc: Oral  Oral   SpO2: 95% 96% 93% 94%  Weight:      Height:        Intake/Output Summary (Last 24 hours)  at 05/07/2019 1156 Last data filed at 05/07/2019 0949 Gross per 24 hour  Intake 592 ml  Output 801 ml  Net -209 ml   Filed Weights   05/05/19 1908 05/07/19 0341  Weight: 80 kg 90.6 kg    Examination:  General exam: Appears calm and comfortable.  Elderly female lying in bed.  Poor historian. Respiratory system: Bilateral decreased breath sounds at bases with wheezing diffusely present.  Some scattered crackles.  Intermittently tachypneic Cardiovascular system: S1 & S2 heard, Rate controlled Gastrointestinal system: Abdomen is nondistended, soft and nontender. Normal bowel sounds heard. Extremities: No cyanosis, clubbing, edema  Central nervous system: Awake but a poor historian.  No focal neurological deficits. Moving extremities Skin: No rashes, lesions or ulcers Psychiatry: Could not be assessed because of patient being a poor historian.    Data Reviewed: I have personally reviewed following labs and imaging studies  CBC: Recent Labs  Lab 05/05/19 1920 05/06/19 0959 05/07/19 0455  WBC 8.0 7.9 7.0  NEUTROABS  --  6.1  --   HGB 10.5* 9.6* 10.0*  HCT 34.2* 31.4* 32.6*  MCV 97.7 98.7 97.0  PLT 288 251 260   Basic Metabolic Panel: Recent Labs  Lab 05/05/19 1920 05/05/19 2211 05/06/19 0959 05/07/19 0455  NA 135  --  143 141  K 3.1*  --  3.3* 3.8  CL 86*  --  89* 86*  CO2 35*  --  42* 42*  GLUCOSE 116*  --  103* 172*  BUN 42*  --  42* 49*  CREATININE 2.42*  --  1.94* 1.53*  CALCIUM 8.3*  --  8.3* 8.7*  MG  --  1.8 1.9  --    GFR: Estimated Creatinine Clearance: 27.2 mL/min (A) (by C-G formula based on SCr of 1.53 mg/dL (H)). Liver Function Tests: Recent Labs  Lab 05/06/19 0959  AST 15  ALT 11  ALKPHOS 64  BILITOT 0.9  PROT 6.6  ALBUMIN 3.6   No results for input(s): LIPASE, AMYLASE in the last 168 hours. No results for input(s): AMMONIA in the last 168 hours. Coagulation Profile: Recent Labs  Lab 05/05/19 2211  INR 1.3*   Cardiac Enzymes: No  results for input(s): CKTOTAL, CKMB, CKMBINDEX, TROPONINI in the last 168 hours. BNP (last 3 results) No results for input(s): PROBNP in the last 8760 hours. HbA1C: No results for input(s): HGBA1C in the last 72 hours. CBG: No results for input(s): GLUCAP in the last 168 hours. Lipid Profile: No results for input(s): CHOL, HDL, LDLCALC, TRIG, CHOLHDL, LDLDIRECT in the last 72 hours. Thyroid Function Tests: No results for input(s): TSH, T4TOTAL, FREET4, T3FREE, THYROIDAB in the last 72 hours. Anemia Panel: No results for input(s): VITAMINB12, FOLATE, FERRITIN, TIBC, IRON, RETICCTPCT in the last 72 hours. Sepsis Labs: No results for input(s): PROCALCITON, LATICACIDVEN in the last 168 hours.  Recent Results (from the past 240 hour(s))  SARS CORONAVIRUS 2 (TAT 6-24 HRS) Nasopharyngeal Nasopharyngeal Swab     Status: None   Collection Time: 05/06/19 12:40 AM   Specimen: Nasopharyngeal Swab  Result Value Ref Range Status   SARS Coronavirus 2 NEGATIVE NEGATIVE Final    Comment: (NOTE) SARS-CoV-2 target nucleic acids are NOT DETECTED. The SARS-CoV-2 RNA is generally detectable in upper and  lower respiratory specimens during the acute phase of infection. Negative results do not preclude SARS-CoV-2 infection, do not rule out co-infections with other pathogens, and should not be used as the sole basis for treatment or other patient management decisions. Negative results must be combined with clinical observations, patient history, and epidemiological information. The expected result is Negative. Fact Sheet for Patients: HairSlick.no Fact Sheet for Healthcare Providers: quierodirigir.com This test is not yet approved or cleared by the [place] FDA and  has been authorized for detection and/or diagnosis of SARS-CoV-2 by FDA under an Emergency Use Authorization (EUA). This EUA will remain  in effect (meaning this test can be used) for  the duration of the COVID-19 declaration under Section 56 4(b)(1) of the Act, 21 U.S.C. section 360bbb-3(b)(1), unless the authorization is terminated or revoked sooner. Performed at [facility], [address] [place], [place] [address]          Radiology Studies: DG Chest 2 View  Result Date: 05/05/2019 CLINICAL DATA:  Edema complains of leg swelling EXAM: CHEST - 2 VIEW COMPARISON:  01/08/2016 FINDINGS: Cardiac loop recorder projects over left heart. Heart size remains enlarged. Stable when compared to the previous exam. Signs of hiatal hernia as before. Lungs with mild pulmonary vascular congestion, no consolidation or evidence of frank edema. No pleural fluid. Signs of prior trauma to the right humerus and right chest wall. IMPRESSION: Cardiomegaly with mild pulmonary vascular congestion. No evidence of frank edema. Electronically Signed   By: Donzetta Kohut M.D.   On: 05/05/2019 19:35   ECHOCARDIOGRAM COMPLETE  Result Date: 05/06/2019    ECHOCARDIOGRAM REPORT   Patient Name:   SHANINE KREIGER Date of Exam: 05/06/2019 Medical Rec #:  [id number]      Height:       64.0 in Accession #:    [id number]     Weight:       176.4 lb Date of Birth:  03-21-1928      BSA:          1.855 m Patient Age:    89 years       BP:           116/62 mmHg Patient Gender: F              HR:           86 bpm. Exam Location:  Inpatient Procedure: 2D Echo, Cardiac Doppler and Color Doppler Indications:    I50.33 Acute on chronic diastolic (congestive) heart failure  History:        Patient has prior history of Echocardiogram examinations, most                 recent 10/26/2016. COPD; Arrythmias:Atrial Fibrillation.                 Sarcoidosis. GERD. Thyroid Disease. Dilated Aortic Root.  Sonographer:    Elmarie Shiley Dance Referring Phys: [id number] MOHAMMAD L GARBA IMPRESSIONS  1. Left ventricular ejection fraction, by estimation, is 55 to 60%. The left ventricle has normal function. The left ventricle has no regional wall  motion abnormalities. There is mild left ventricular hypertrophy. Left ventricular diastolic parameters are indeterminate.  2. Right ventricular systolic function is normal. The right ventricular size is normal. There is moderately elevated pulmonary artery systolic pressure.  3. Left atrial size was moderately dilated.  4. Right atrial size was moderately dilated.  5. The mitral valve is myxomatous. Mild mitral valve regurgitation. No evidence of mitral  stenosis.  6. Mean gradient consistent with aortic valve sclerosis. However, leaflet movement is restricted and there appears to be at least mild aortic stenosis. The left and right coronary cusps are nearly fixed. The aortic valve is abnormal. Aortic valve regurgitation is moderate. Mild to moderate aortic valve sclerosis/calcification is present, without any evidence of aortic stenosis. Aortic regurgitation PHT measures 319 msec.  7. Aortic dilatation noted. There is mild dilatation of the ascending aorta measuring 40 mm.  8. The inferior vena cava is normal in size with <50% respiratory variability, suggesting right atrial pressure of 8 mmHg. FINDINGS  Left Ventricle: Left ventricular ejection fraction, by estimation, is 55 to 60%. The left ventricle has normal function. The left ventricle has no regional wall motion abnormalities. The left ventricular internal cavity size was normal in size. There is  mild left ventricular hypertrophy. Left ventricular diastolic parameters are indeterminate. Right Ventricle: The right ventricular size is normal. No increase in right ventricular wall thickness. Right ventricular systolic function is normal. There is moderately elevated pulmonary artery systolic pressure. The tricuspid regurgitant velocity is 3.12 m/s, and with an assumed right atrial pressure of 8 mmHg, the estimated right ventricular systolic pressure is 46.9 mmHg. Left Atrium: Left atrial size was moderately dilated. Right Atrium: Right atrial size was  moderately dilated. Pericardium: There is no evidence of pericardial effusion. Mitral Valve: The mitral valve is myxomatous. Normal mobility of the mitral valve leaflets. Mild mitral annular calcification. Mild mitral valve regurgitation. No evidence of mitral valve stenosis. Tricuspid Valve: The tricuspid valve is normal in structure. Tricuspid valve regurgitation is mild . No evidence of tricuspid stenosis. Aortic Valve: Mean gradient consistent with aortic valve sclerosis. However, leaflet movement is restricted and there appears to be at least mild aortic stenosis. The left and right coronary cusps are nearly fixed. The aortic valve is abnormal. . There is moderate thickening and moderate calcification of the aortic valve. Aortic valve regurgitation is moderate. Aortic regurgitation PHT measures 319 msec. Mild to moderate aortic valve sclerosis/calcification is present, without any evidence of aortic stenosis. There is moderate thickening of the aortic valve. There is moderate calcification of the aortic valve. Aortic valve mean gradient measures 6.0 mmHg. Aortic valve peak gradient measures 11.4 mmHg. Aortic valve area, by VTI measures 2.21 cm. Pulmonic Valve: The pulmonic valve was normal in structure. Pulmonic valve regurgitation is mild. No evidence of pulmonic stenosis. Aorta: The aortic root is normal in size and structure and aortic dilatation noted. There is mild dilatation of the ascending aorta measuring 40 mm. Venous: The inferior vena cava is normal in size with less than 50% respiratory variability, suggesting right atrial pressure of 8 mmHg. IAS/Shunts: No atrial level shunt detected by color flow Doppler.  LEFT VENTRICLE PLAX 2D LVIDd:         4.20 cm LVIDs:         2.70 cm LV PW:         1.27 cm LV IVS:        0.90 cm LVOT diam:     2.30 cm LV SV:         87 LV SV Index:   47 LVOT Area:     4.15 cm  RIGHT VENTRICLE             IVC RV Basal diam:  2.80 cm     IVC diam: 3.30 cm RV S prime:      10.40 cm/s TAPSE (M-mode): 1.8 cm LEFT ATRIUM  Index       RIGHT ATRIUM           Index LA diam:        4.20 cm 2.26 cm/m  RA Area:     22.90 cm LA Vol (A2C):   76.5 ml 41.25 ml/m RA Volume:   67.40 ml  36.34 ml/m LA Vol (A4C):   51.1 ml 27.55 ml/m LA Biplane Vol: 62.9 ml 33.92 ml/m  AORTIC VALVE AV Area (Vmax):    2.15 cm AV Area (Vmean):   2.16 cm AV Area (VTI):     2.21 cm AV Vmax:           169.00 cm/s AV Vmean:          119.000 cm/s AV VTI:            0.396 m AV Peak Grad:      11.4 mmHg AV Mean Grad:      6.0 mmHg LVOT Vmax:         87.55 cm/s LVOT Vmean:        61.800 cm/s LVOT VTI:          0.211 m LVOT/AV VTI ratio: 0.53 AI PHT:            319 msec  AORTA Ao Root diam: 3.60 cm Ao Asc diam:  3.90 cm MITRAL VALVE                TRICUSPID VALVE MV Area (PHT): 4.26 cm     TR Peak grad:   38.9 mmHg MV Decel Time: 178 msec     TR Vmax:        312.00 cm/s MV E velocity: 158.50 cm/s                             SHUNTS                             Systemic VTI:  0.21 m                             Systemic Diam: 2.30 cm [name] MD Electronically signed by [name] MD Signature Date/Time: 05/06/2019/6:32:31 PM    Final         Scheduled Meds: . apixaban  2.5 mg Oral BID  . ipratropium-albuterol  3 mL Nebulization QID  . methylPREDNISolone (SOLU-MEDROL) injection  40 mg Intravenous Q12H  . sodium chloride flush  3 mL Intravenous Once  . sodium chloride flush  3 mL Intravenous Q12H   Continuous Infusions: . sodium chloride            Glade Lloyd, MD [facility] 05/07/2019, 11:56 AM

## 2019-05-07 NOTE — Evaluation (Signed)
Physical Therapy Evaluation Patient Details Name: Kari [name] MRN: [id number] DOB: 1928-11-27 Today's Date: 05/07/2019   History of Present Illness  85 year old female with history of chronic diastolic CHF, COPD, chronic kidney disease stage III, hypothyroidism, valvular heart disease, GERD, sarcoidosis presented from skilled nursing facility with worsening lower extremity edema and shortness of breath.   Clinical Impression  Pt admitted with above diagnosis. Pt was able to get to 3N1 with min assist as well as walk with min to min guard assist with RW.  Pt is most likely close to her baseline. On 2LO2 PTA and currently. Tolerated treatment well.  Will follow acutely. Pt currently with functional limitations due to the deficits listed below (see PT Problem List). Pt will benefit from skilled PT to increase their independence and safety with mobility to allow discharge to the venue listed below.      Follow Up Recommendations Home health PT;Supervision - Intermittent    Equipment Recommendations  None recommended by PT    Recommendations for Other Services       Precautions / Restrictions Precautions Precautions: Fall Restrictions Weight Bearing Restrictions: No      Mobility  Bed Mobility Overal bed mobility: Needs Assistance Bed Mobility: Supine to Sit     Supine to sit: Min guard     General bed mobility comments: able to come to EOB without assist with pt using momentum.   Transfers Overall transfer level: Needs assistance Equipment used: Rolling walker (2 wheeled) Transfers: Sit to/from [employer] Sit to Stand: Min assist Stand pivot transfers: Min assist       General transfer comment: Pt was able to stand with cues for hand placement and a little assist. Upon standing, pt stated she was urinating.  Obtained 3N1 quickly and pt pivoted to 3N1 with min guard to min assist with cues for reaching back for hand placement. Pt needed assist to be  cleaned when she stood. Overall poor safety awareness needing max cues.   Ambulation/Gait Ambulation/Gait assistance: Min assist;+2 safety/equipment Gait Distance (Feet): 25 Feet Assistive device: Rolling walker (2 wheeled) Gait Pattern/deviations: Step-through pattern;Decreased stride length;Drifts right/left;Trunk flexed;Wide base of support   Gait velocity interpretation: <1.31 ft/sec, indicative of household ambulator General Gait Details: Pt needed assist for ambulation due to poor safety awareness. Cues to stay close to RW as well as for stability. Pt moves quickly as well decreasing safety. cues for safety with stand to sit as well.   Stairs            Wheelchair Mobility    Modified Rankin (Stroke Patients Only)       Balance Overall balance assessment: Needs assistance Sitting-balance support: No upper extremity supported;Feet supported Sitting balance-Leahy Scale: Fair     Standing balance support: Bilateral upper extremity supported;During functional activity Standing balance-Leahy Scale: Poor Standing balance comment: relies on RW for support.                              Pertinent Vitals/Pain Pain Assessment: No/denies pain    Home Living Family/patient expects to be discharged to:: Private residence Living Arrangements: Alone Available Help at Discharge: Family;Available PRN/intermittently;Personal care attendant Type of Home: Apartment Home Access: Level entry     Home Layout: One level Home Equipment: Walker - 4 wheels;Bedside commode;Shower seat;Grab bars - toilet;Grab bars - tub/shower(home O2 2L)      Prior Function Level of Independence: Independent  Hand Dominance   Dominant Hand: Right    Extremity/Trunk Assessment   Upper Extremity Assessment Upper Extremity Assessment: Defer to OT evaluation    Lower Extremity Assessment Lower Extremity Assessment: Generalized weakness    Cervical / Trunk  Assessment Cervical / Trunk Assessment: Kyphotic  Communication   Communication: No difficulties  Cognition Arousal/Alertness: Awake/alert Behavior During Therapy: WFL for tasks assessed/performed Overall Cognitive Status: Within Functional Limits for tasks assessed                                        General Comments General comments (skin integrity, edema, etc.): Pt was on 2LO2 PTA.      Exercises General Exercises - Lower Extremity Long Arc Quad: AROM;Both;10 reps;Seated   Assessment/Plan    PT Assessment Patient needs continued PT services  PT Problem List Decreased activity tolerance;Decreased balance;Decreased mobility;Decreased knowledge of use of DME;Decreased safety awareness;Decreased knowledge of precautions;Cardiopulmonary status limiting activity       PT Treatment Interventions DME instruction;Gait training;Functional mobility training;Therapeutic activities;Therapeutic exercise;Balance training;Patient/family education    PT Goals (Current goals can be found in the Care Plan section)  Acute Rehab PT Goals Patient Stated Goal: to go home PT Goal Formulation: With patient Time For Goal Achievement: 05/21/19 Potential to Achieve Goals: Good    Frequency Min 3X/week   Barriers to discharge        Co-evaluation               AM-PAC PT "6 Clicks" Mobility  Outcome Measure Help needed turning from your back to your side while in a flat bed without using bedrails?: None Help needed moving from lying on your back to sitting on the side of a flat bed without using bedrails?: None Help needed moving to and from a bed to a chair (including a wheelchair)?: A Little Help needed standing up from a chair using your arms (e.g., wheelchair or bedside chair)?: A Little Help needed to walk in hospital room?: A Little Help needed climbing 3-5 steps with a railing? : A Lot 6 Click Score: 19    End of Session Equipment Utilized During Treatment:  Gait belt;Oxygen Activity Tolerance: Patient limited by fatigue Patient left: in chair;with call bell/phone within reach;with chair alarm set Nurse Communication: Mobility status PT Visit Diagnosis: Unsteadiness on feet (R26.81);History of falling (Z91.81);Muscle weakness (generalized) (M62.81)    Time: 7591-6384 PT Time Calculation (min) (ACUTE ONLY): 30 min   Charges:   PT Evaluation $PT Eval Moderate Complexity: 1 Mod PT Treatments $Gait Training: 8-22 mins        Valen Gillison W,PT Acute Rehabilitation Services Pager:  [phone]  Office:  [place] 05/07/2019, 1:46 PM

## 2019-05-07 NOTE — TOC Initial Note (Signed)
Transition of Care [facility]) - Initial/Assessment Note    Patient Details  Name: Kari [name] MRN: [id number] Date of Birth: 1928/07/04  Transition of Care [facility]) CM/SW Contact:    Gildardo Griffes, LCSW Phone Number: 05/07/2019, 10:03 AM  Clinical Narrative:                  CSW spoke with patient's daughter Millette regarding patient's discharge plan. Millette reports patient's name is pronounced Elka and that she is long term care at [facility] since Feb 15 of this year. She reports plan is for patient to return to [facility] pending medical stability. Millette reports concerns with holding room for $200/day and would like to know anticipated dc date.   CSW reached out to MD, anticipated dc date in 1-2 days, this information was relayed to [facility] to assist in her decision to hold patient's room until she can return to [place].   Expected Discharge Plan: Skilled Nursing Facility Barriers to Discharge: Continued Medical Work up   Patient Goals and CMS Choice   CMS Medicare.gov Compare Post Acute Care list provided to:: Patient Represenative (must comment)(daughter Millett) Choice offered to / list presented to : Adult Children  Expected Discharge Plan and Services Expected Discharge Plan: Skilled Nursing Facility     Post Acute Care Choice: Skilled Nursing Facility Living arrangements for the past 2 months: Skilled Nursing Facility([facility] since Mar 11 2019)                                      Prior Living Arrangements/Services Living arrangements for the past 2 months: Skilled Nursing Facility([facility] since Mar 11 2019) Lives with:: Self Patient language and need for interpreter reviewed:: Yes Do you feel safe going back to the place where you live?: Yes      Need for Family Participation in Patient Care: Yes (Comment) Care giver support system in place?: Yes (comment)   Criminal Activity/Legal Involvement Pertinent to Current  Situation/Hospitalization: No - Comment as needed  Activities of Daily Living      Permission Sought/Granted Permission sought to share information with : Case Manager, Magazine features editor, Family Supports Permission granted to share information with : Yes, Verbal Permission Granted  Share Information with NAME: Millett  Permission granted to share info w AGENCY: SNFs  Permission granted to share info w Relationship: daughter  Permission granted to share info w Contact Information: [phone]  Emotional Assessment Appearance:: Appears stated age       Alcohol / Substance Use: Not Applicable Psych Involvement: No (comment)  Admission diagnosis:  Hypokalemia [E87.6] Acute exacerbation of CHF (congestive heart failure) (HCC) [I50.9] AKI (acute kidney injury) (HCC) [N17.9] Acute on chronic congestive heart failure, unspecified heart failure type [facility]) [I50.9] Patient Active Problem List   Diagnosis Date Noted  . Acute exacerbation of CHF (congestive heart failure) (HCC) 05/06/2019  . Edema 11/28/2017  . Acute on chronic diastolic heart failure (HCC) 11/28/2017  . Mild ascending aorta dilatation (HCC) 11/28/2017  . Dyspnea 11/02/2015  . Essential hypertension 01/28/2015  . Palpitation 10/28/2014  . Pulmonary hypertension (HCC) 08/26/2014  . Atrial fibrillation (HCC) 07/23/2014  . Abdominal pain, epigastric 04/18/2013  . Fall 03/01/2013  . Multiple fractures of ribs of right side 03/01/2013  . Acute respiratory failure (HCC) 03/01/2013  . Adrenal hemorrhage (HCC) 03/01/2013  . Sternal fracture 03/01/2013  . COPD (chronic obstructive pulmonary disease) (HCC) 03/01/2013  .  Pulmonary contusion 03/01/2013  . Flail chest 02/27/2013  . Allergic rhinitis 11/25/2012  . GERD (gastroesophageal reflux disease) 10/07/2011  . Hiatal hernia 10/07/2011  . Asthma with COPD ([place]) 10/07/2011  . Sarcoidosis 10/07/2011  . First degree atrioventricular block 08/30/2011  .  INTERMITTENT VERTIGO 02/23/2009  . MITRAL VALVE INSUFF&AORTIC VALVE INSUFF 01/26/2009  . ATRIAL FLUTTER 01/26/2009  . PALPITATIONS 01/26/2009   PCP:  Algernon Huxley, MD Pharmacy:   [facility] - [place], [place] - [address] [place] [address] Phone: [phone] Fax: [phone]     Social Determinants of Health (SDOH) Interventions    Readmission Risk Interventions No flowsheet data found.

## 2019-05-07 NOTE — Progress Notes (Signed)
RT called to patient's room for a PRN neb treatment due to wheezing and increased WOB.  RN and NT at bedside. RN had started the neb treatment, before RT arrived.  Patient is pleasant, has bilateral expiratory wheezes, states she is breathing better, and has sats 96% on 2 L.  Patient states she takes treatments at home Q4.  RT will continue to monitor.

## 2019-05-08 ENCOUNTER — Inpatient Hospital Stay (HOSPITAL_COMMUNITY): Payer: Medicare Other

## 2019-05-08 DIAGNOSIS — J9621 Acute and chronic respiratory failure with hypoxia: Secondary | ICD-10-CM

## 2019-05-08 DIAGNOSIS — E876 Hypokalemia: Secondary | ICD-10-CM | POA: Diagnosis not present

## 2019-05-08 DIAGNOSIS — J449 Chronic obstructive pulmonary disease, unspecified: Secondary | ICD-10-CM

## 2019-05-08 DIAGNOSIS — I480 Paroxysmal atrial fibrillation: Secondary | ICD-10-CM | POA: Diagnosis not present

## 2019-05-08 DIAGNOSIS — I5043 Acute on chronic combined systolic (congestive) and diastolic (congestive) heart failure: Secondary | ICD-10-CM

## 2019-05-08 DIAGNOSIS — N179 Acute kidney failure, unspecified: Secondary | ICD-10-CM | POA: Diagnosis not present

## 2019-05-08 LAB — CBC WITH DIFFERENTIAL/PLATELET
Abs Immature Granulocytes: 0.06 10*3/uL (ref 0.00–0.07)
Basophils Absolute: 0 10*3/uL (ref 0.0–0.1)
Basophils Relative: 0 %
Eosinophils Absolute: 0 10*3/uL (ref 0.0–0.5)
Eosinophils Relative: 0 %
HCT: 32.5 % — ABNORMAL LOW (ref 36.0–46.0)
Hemoglobin: 10.2 g/dL — ABNORMAL LOW (ref 12.0–15.0)
Immature Granulocytes: 1 %
Lymphocytes Relative: 3 %
Lymphs Abs: 0.3 10*3/uL — ABNORMAL LOW (ref 0.7–4.0)
MCH: 30 pg (ref 26.0–34.0)
MCHC: 31.4 g/dL (ref 30.0–36.0)
MCV: 95.6 fL (ref 80.0–100.0)
Monocytes Absolute: 0.3 10*3/uL (ref 0.1–1.0)
Monocytes Relative: 4 %
Neutro Abs: 7 10*3/uL (ref 1.7–7.7)
Neutrophils Relative %: 92 %
Platelets: 271 10*3/uL (ref 150–400)
RBC: 3.4 MIL/uL — ABNORMAL LOW (ref 3.87–5.11)
RDW: 15 % (ref 11.5–15.5)
WBC: 7.6 10*3/uL (ref 4.0–10.5)
nRBC: 0 % (ref 0.0–0.2)

## 2019-05-08 LAB — BASIC METABOLIC PANEL
Anion gap: 14 (ref 5–15)
BUN: 58 mg/dL — ABNORMAL HIGH (ref 8–23)
CO2: 41 mmol/L — ABNORMAL HIGH (ref 22–32)
Calcium: 9.1 mg/dL (ref 8.9–10.3)
Chloride: 86 mmol/L — ABNORMAL LOW (ref 98–111)
Creatinine, Ser: 1.5 mg/dL — ABNORMAL HIGH (ref 0.44–1.00)
GFR calc Af Amer: 35 mL/min — ABNORMAL LOW (ref >60)
GFR calc non Af Amer: 31 mL/min — ABNORMAL LOW (ref >60)
Glucose, Bld: 148 mg/dL — ABNORMAL HIGH (ref 70–99)
Potassium: 3.5 mmol/L (ref 3.5–5.1)
Sodium: 141 mmol/L (ref 135–145)

## 2019-05-08 LAB — MAGNESIUM: Magnesium: 2.2 mg/dL (ref 1.7–2.4)

## 2019-05-08 MED ORDER — FUROSEMIDE 10 MG/ML IJ SOLN
40.0000 mg | Freq: Once | INTRAMUSCULAR | Status: AC
  Administered 2019-05-08: 14:00:00 40 mg via INTRAVENOUS
  Filled 2019-05-08: qty 4

## 2019-05-08 MED ORDER — IPRATROPIUM-ALBUTEROL 0.5-2.5 (3) MG/3ML IN SOLN
3.0000 mL | Freq: Once | RESPIRATORY_TRACT | Status: AC
  Administered 2019-05-08: 3 mL via RESPIRATORY_TRACT
  Filled 2019-05-08: qty 3

## 2019-05-08 NOTE — Progress Notes (Signed)
Progress Note  Patient Name: Kari [name] Date of Encounter: 05/08/2019  Primary Cardiologist: Ena Dawley, MD   Subjective   Dyspneic with activities; no chest pain  Inpatient Medications    Scheduled Meds: . apixaban  2.5 mg Oral BID  . ipratropium-albuterol  3 mL Nebulization QID  . methylPREDNISolone (SOLU-MEDROL) injection  40 mg Intravenous Q8H  . sodium chloride flush  3 mL Intravenous Once  . sodium chloride flush  3 mL Intravenous Q12H   Continuous Infusions: . sodium chloride     PRN Meds: sodium chloride, acetaminophen, ondansetron (ZOFRAN) IV, sodium chloride flush   Vital Signs    Vitals:   05/07/19 2055 05/08/19 0554 05/08/19 0601 05/08/19 0742  BP: 120/69 139/88    Pulse: 97 84    Resp: 20 18    Temp: (!) 97.3 F (36.3 C) 97.6 F (36.4 C)    TempSrc: Oral Oral    SpO2: 97% 98%  97%  Weight:   90.9 kg   Height:        Intake/Output Summary (Last 24 hours) at 05/08/2019 1010 Last data filed at 05/08/2019 1006 Gross per 24 hour  Intake 1523 ml  Output 500 ml  Net 1023 ml   Last 3 Weights 05/08/2019 05/07/2019 05/05/2019  Weight (lbs) 200 lb 6.4 oz 199 lb 11.2 oz 176 lb 5.9 oz  Weight (kg) 90.901 kg 90.583 kg 80 kg  Some encounter information is confidential and restricted. Go to Review Flowsheets activity to see all data.      Telemetry    Atrial fibrillation- Personally Reviewed  Physical Exam   GEN: No acute distress.   Neck: No JVD Cardiac: irregular Respiratory:  Diminished breath sounds throughout, no wheeze. GI: Soft, nontender, non-distended  MS: No edema Neuro:  Nonfocal  Psych: Normal affect   Labs    High Sensitivity Troponin:   Recent Labs  Lab 05/05/19 2211 05/06/19 0145 05/06/19 0959  TROPONINIHS 26* 25* 26*      Chemistry Recent Labs  Lab 05/06/19 0959 05/07/19 0455 05/08/19 0459  NA 143 141 141  K 3.3* 3.8 3.5  CL 89* 86* 86*  CO2 42* 42* 41*  GLUCOSE 103* 172* 148*  BUN 42* 49* 58*  CREATININE  1.94* 1.53* 1.50*  CALCIUM 8.3* 8.7* 9.1  PROT 6.6  --   --   ALBUMIN 3.6  --   --   AST 15  --   --   ALT 11  --   --   ALKPHOS 64  --   --   BILITOT 0.9  --   --   GFRNONAA 22* 30* 31*  GFRAA 26* 35* 35*  ANIONGAP 12 13 14      Hematology Recent Labs  Lab 05/06/19 0959 05/07/19 0455 05/08/19 0459  WBC 7.9 7.0 7.6  RBC 3.18* 3.36* 3.40*  HGB 9.6* 10.0* 10.2*  HCT 31.4* 32.6* 32.5*  MCV 98.7 97.0 95.6  MCH 30.2 29.8 30.0  MCHC 30.6 30.7 31.4  RDW 15.3 14.7 15.0  PLT 251 260 271    BNP Recent Labs  Lab 05/05/19 2211  BNP 363.0*      Radiology    ECHOCARDIOGRAM COMPLETE  Result Date: 05/06/2019    ECHOCARDIOGRAM REPORT   Patient Name:   Kari [name] Date of Exam: 05/06/2019 Medical Rec #:  [id number]      Height:       64.0 in Accession #:    [id number]     Weight:  176.4 lb Date of Birth:  November 27, 1928      BSA:          1.855 m Patient Age:    86 years       BP:           116/62 mmHg Patient Gender: F              HR:           86 bpm. Exam Location:  Inpatient Procedure: 2D Echo, Cardiac Doppler and Color Doppler Indications:    I50.33 Acute on chronic diastolic (congestive) heart failure  History:        Patient has prior history of Echocardiogram examinations, most                 recent 10/26/2016. COPD; Arrythmias:Atrial Fibrillation.                 Sarcoidosis. GERD. Thyroid Disease. Dilated Aortic Root.  Sonographer:    Elmarie Shiley Dance Referring Phys: [id number] MOHAMMAD L GARBA IMPRESSIONS  1. Left ventricular ejection fraction, by estimation, is 55 to 60%. The left ventricle has normal function. The left ventricle has no regional wall motion abnormalities. There is mild left ventricular hypertrophy. Left ventricular diastolic parameters are indeterminate.  2. Right ventricular systolic function is normal. The right ventricular size is normal. There is moderately elevated pulmonary artery systolic pressure.  3. Left atrial size was moderately dilated.  4. Right atrial size  was moderately dilated.  5. The mitral valve is myxomatous. Mild mitral valve regurgitation. No evidence of mitral stenosis.  6. Mean gradient consistent with aortic valve sclerosis. However, leaflet movement is restricted and there appears to be at least mild aortic stenosis. The left and right coronary cusps are nearly fixed. The aortic valve is abnormal. Aortic valve regurgitation is moderate. Mild to moderate aortic valve sclerosis/calcification is present, without any evidence of aortic stenosis. Aortic regurgitation PHT measures 319 msec.  7. Aortic dilatation noted. There is mild dilatation of the ascending aorta measuring 40 mm.  8. The inferior vena cava is normal in size with <50% respiratory variability, suggesting right atrial pressure of 8 mmHg. FINDINGS  Left Ventricle: Left ventricular ejection fraction, by estimation, is 55 to 60%. The left ventricle has normal function. The left ventricle has no regional wall motion abnormalities. The left ventricular internal cavity size was normal in size. There is  mild left ventricular hypertrophy. Left ventricular diastolic parameters are indeterminate. Right Ventricle: The right ventricular size is normal. No increase in right ventricular wall thickness. Right ventricular systolic function is normal. There is moderately elevated pulmonary artery systolic pressure. The tricuspid regurgitant velocity is 3.12 m/s, and with an assumed right atrial pressure of 8 mmHg, the estimated right ventricular systolic pressure is 46.9 mmHg. Left Atrium: Left atrial size was moderately dilated. Right Atrium: Right atrial size was moderately dilated. Pericardium: There is no evidence of pericardial effusion. Mitral Valve: The mitral valve is myxomatous. Normal mobility of the mitral valve leaflets. Mild mitral annular calcification. Mild mitral valve regurgitation. No evidence of mitral valve stenosis. Tricuspid Valve: The tricuspid valve is normal in structure. Tricuspid  valve regurgitation is mild . No evidence of tricuspid stenosis. Aortic Valve: Mean gradient consistent with aortic valve sclerosis. However, leaflet movement is restricted and there appears to be at least mild aortic stenosis. The left and right coronary cusps are nearly fixed. The aortic valve is abnormal. . There is moderate thickening and moderate calcification of the  aortic valve. Aortic valve regurgitation is moderate. Aortic regurgitation PHT measures 319 msec. Mild to moderate aortic valve sclerosis/calcification is present, without any evidence of aortic stenosis. There is moderate thickening of the aortic valve. There is moderate calcification of the aortic valve. Aortic valve mean gradient measures 6.0 mmHg. Aortic valve peak gradient measures 11.4 mmHg. Aortic valve area, by VTI measures 2.21 cm. Pulmonic Valve: The pulmonic valve was normal in structure. Pulmonic valve regurgitation is mild. No evidence of pulmonic stenosis. Aorta: The aortic root is normal in size and structure and aortic dilatation noted. There is mild dilatation of the ascending aorta measuring 40 mm. Venous: The inferior vena cava is normal in size with less than 50% respiratory variability, suggesting right atrial pressure of 8 mmHg. IAS/Shunts: No atrial level shunt detected by color flow Doppler.  LEFT VENTRICLE PLAX 2D LVIDd:         4.20 cm LVIDs:         2.70 cm LV PW:         1.27 cm LV IVS:        0.90 cm LVOT diam:     2.30 cm LV SV:         87 LV SV Index:   47 LVOT Area:     4.15 cm  RIGHT VENTRICLE             IVC RV Basal diam:  2.80 cm     IVC diam: 3.30 cm RV S prime:     10.40 cm/s TAPSE (M-mode): 1.8 cm LEFT ATRIUM             Index       RIGHT ATRIUM           Index LA diam:        4.20 cm 2.26 cm/m  RA Area:     22.90 cm LA Vol (A2C):   76.5 ml 41.25 ml/m RA Volume:   67.40 ml  36.34 ml/m LA Vol (A4C):   51.1 ml 27.55 ml/m LA Biplane Vol: 62.9 ml 33.92 ml/m  AORTIC VALVE AV Area (Vmax):    2.15 cm AV Area  (Vmean):   2.16 cm AV Area (VTI):     2.21 cm AV Vmax:           169.00 cm/s AV Vmean:          119.000 cm/s AV VTI:            0.396 m AV Peak Grad:      11.4 mmHg AV Mean Grad:      6.0 mmHg LVOT Vmax:         87.55 cm/s LVOT Vmean:        61.800 cm/s LVOT VTI:          0.211 m LVOT/AV VTI ratio: 0.53 AI PHT:            319 msec  AORTA Ao Root diam: 3.60 cm Ao Asc diam:  3.90 cm MITRAL VALVE                TRICUSPID VALVE MV Area (PHT): 4.26 cm     TR Peak grad:   38.9 mmHg MV Decel Time: 178 msec     TR Vmax:        312.00 cm/s MV E velocity: 158.50 cm/s  SHUNTS                             Systemic VTI:  0.21 m                             Systemic Diam: 2.30 cm [name] MD Electronically signed by [name] MD Signature Date/Time: 05/06/2019/6:32:31 PM    Final     Patient Profile     86 year old female with past medical history of persistent atrial fibrillation, chronic diastolic congestive heart failure, moderate to severe mitral regurgitation by prior echocardiogram, COPD on chronic home oxygen, sarcoid for evaluation of acute on chronic diastolic congestive heart failure.    Echocardiogram this admission shows normal LV function, moderate pulmonary hypertension, moderate biatrial enlargement, mild mitral regurgitation, mild aortic stenosis, moderate aortic insufficiency, mildly dilated aortic root.  Last echocardiogram performed at [facility] March 2020 showed normal LV function, moderate to moderately severe mitral regurgitation, moderately severe pulmonary hypertension, mild aortic insufficiency/aortic stenosis.   Assessment & Plan    1 COPD-I think this is the predominant issue.  She seems to be improving with pulmonary toilet.  Continue bronchodilators and steroids.  Managed by primary care.  2 acute on chronic diastolic congestive heart failure-pt not markedly volume overloaded on examination.  Diuretics on hold given prerenal azotemia.  Will resume  lower dose later.  Note LV function is normal.  3 permanent atrial fibrillation-beta-blocker discontinued due to COPD.  Heart rate appears to be controlled.  Can add Cardizem later if needed.  Continue apixaban.  4 aortic stenosis/aortic insufficiency-follow-up Dr. Delton See after discharge.  5 acute on chronic stage III kidney disease-follow renal function.  Diuretics on hold.  6 no CODE BLUE  For questions or updates, please contact [facility] Please consult [email or web address] for contact info under        Signed, Olga Millers, MD  05/08/2019, 10:10 AM

## 2019-05-08 NOTE — Progress Notes (Signed)
Patient complaining of shortness of breath. Lungs sounds wheezing bilaterally. Oxygen saturation 94% on 2L. Dr. Roda Shutters made aware, new orders received, will continue to monitor.

## 2019-05-08 NOTE — NC FL2 (Addendum)
[place] MEDICAID FL2 LEVEL OF CARE SCREENING TOOL     IDENTIFICATION  Patient Name: Kari [name] Birthdate: May 26, 1928 Sex: female Admission Date (Current Location): 05/05/2019  [facility] and [place] Number:  Producer, television/film/video and Address:  [facility] [place], [address], [place], [place] [address]      Provider Number: [id number]  Attending Physician Name and Address:  Albertine Grates, MD  Relative Name and Phone Number:  Casimiro Needle (son) [phone]    Current Level of Care: Hospital Recommended Level of Care: Skilled Nursing Facility Prior Approval Number:    Date Approved/Denied:   PASRR Number:   [id number]   Discharge Plan: SNF    Current Diagnoses: Patient Active Problem List   Diagnosis Date Noted  . Acute exacerbation of CHF (congestive heart failure) (HCC) 05/06/2019  . Edema 11/28/2017  . Acute on chronic diastolic heart failure (HCC) 11/28/2017  . Mild ascending aorta dilatation (HCC) 11/28/2017  . Dyspnea 11/02/2015  . Essential hypertension 01/28/2015  . Palpitation 10/28/2014  . Pulmonary hypertension (HCC) 08/26/2014  . Atrial fibrillation (HCC) 07/23/2014  . Abdominal pain, epigastric 04/18/2013  . Fall 03/01/2013  . Multiple fractures of ribs of right side 03/01/2013  . Acute respiratory failure (HCC) 03/01/2013  . Adrenal hemorrhage (HCC) 03/01/2013  . Sternal fracture 03/01/2013  . COPD (chronic obstructive pulmonary disease) (HCC) 03/01/2013  . Pulmonary contusion 03/01/2013  . Flail chest 02/27/2013  . Allergic rhinitis 11/25/2012  . GERD (gastroesophageal reflux disease) 10/07/2011  . Hiatal hernia 10/07/2011  . Asthma with COPD (HCC) 10/07/2011  . Sarcoidosis 10/07/2011  . First degree atrioventricular block 08/30/2011  . INTERMITTENT VERTIGO 02/23/2009  . MITRAL VALVE INSUFF&AORTIC VALVE INSUFF 01/26/2009  . ATRIAL FLUTTER 01/26/2009  . PALPITATIONS 01/26/2009    Orientation RESPIRATION BLADDER Height & Weight      Self, Place, Situation  O2(2L nasal cannula) Incontinent, External catheter Weight: 200 lb 6.4 oz (90.9 kg) Height:  5\' 4"  (162.6 cm)  BEHAVIORAL SYMPTOMS/MOOD NEUROLOGICAL BOWEL NUTRITION STATUS      Continent Diet(see discharge summary)  AMBULATORY STATUS COMMUNICATION OF NEEDS Skin   Limited Assist Verbally Normal                       Personal Care Assistance Level of Assistance  Bathing, Feeding, Dressing, Total care Bathing Assistance: Limited assistance Feeding assistance: Limited assistance Dressing Assistance: Limited assistance Total Care Assistance: Limited assistance   Functional Limitations Info  Sight, Hearing, Speech Sight Info: Adequate Hearing Info: Adequate      SPECIAL CARE FACTORS FREQUENCY  PT (By licensed PT), OT (By licensed OT)     PT Frequency: min 5x weekly OT Frequency: min 5x weekly            Contractures Contractures Info: Not present    Additional Factors Info  Allergies, Code Status Code Status Info: DNR Allergies Info: Advair Diskus, Aspirin, Dulera, Ivp Dye, Losartan Potassium, Propofol, Amitriptyline, Budesonide-formoterol fumarate, codeine, ketorolac tromethamine, oxycodone-acetaminophen, sulfasalazine, acetaminophen, alprazolam, amitriptyline hcl, amlodipine besylate, azithromycin, fluticasone furoate-vilanterol, iodine,, morphine and related, oxycodone,prevacid, tramodol           Current Medications (05/08/2019):  This is the current hospital active medication list Current Facility-Administered Medications  Medication Dose Route Frequency Provider Last Rate Last Admin  . 0.9 %  sodium chloride infusion  250 mL Intravenous PRN 05/10/2019, MD      . acetaminophen (TYLENOL) tablet 650 mg  650 mg Oral Q4H PRN  Elwyn Reach, MD      . apixaban Arne Cleveland) tablet 2.5 mg  2.5 mg Oral BID Gala Romney L, MD   2.5 mg at 05/08/19 1006  . ipratropium-albuterol (DUONEB) 0.5-2.5 (3) MG/3ML nebulizer solution 3 mL  3 mL  Nebulization QID Aline August, MD   3 mL at 05/08/19 1523  . methylPREDNISolone sodium succinate (SOLU-MEDROL) 40 mg/mL injection 40 mg  40 mg Intravenous Q8H Alekh, Kshitiz, MD   40 mg at 05/08/19 1329  . ondansetron (ZOFRAN) injection 4 mg  4 mg Intravenous Q6H PRN Gala Romney L, MD      . sodium chloride flush (NS) 0.9 % injection 3 mL  3 mL Intravenous Once Little, Wenda Overland, MD      . sodium chloride flush (NS) 0.9 % injection 3 mL  3 mL Intravenous Q12H Gala Romney L, MD   3 mL at 05/08/19 1006  . sodium chloride flush (NS) 0.9 % injection 3 mL  3 mL Intravenous PRN Elwyn Reach, MD         Discharge Medications: Please see discharge summary for a list of discharge medications.  Relevant Imaging Results:  Relevant Lab Results:   Additional Information SSN: [id number]  Alberteen Sam, LCSW

## 2019-05-08 NOTE — Progress Notes (Signed)
PROGRESS NOTE  Kari [name] NUU:[id number] DOB: 04/19/1928 DOA: 05/05/2019 PCP: Algernon Huxley, MD   Brief Narrative:  86 year old female with history of chronic diastolic CHF, COPD, chronic kidney disease stage III, hypothyroidism, valvular heart disease, GERD, sarcoidosis presented from skilled nursing facility with worsening lower extremity edema and shortness of breath.  On presentation, chest x-ray showed cardiomegaly with mild pulmonary vascular congestion with no frank edema.  She was admitted and started on IV Lasix.  Subsequently, Solu-Medrol was also started for wheezing.  Cardiology was consulted.   HPI/Recap of past 24 hours:  Feels more short of breath started last night Denies chest pain She is on 2 L oxygen, no hypoxia  Assessment/Plan: Principal Problem:   Acute exacerbation of CHF (congestive heart failure) (HCC) Active Problems:   MITRAL VALVE INSUFF&AORTIC VALVE INSUFF   GERD (gastroesophageal reflux disease)   Asthma with COPD (HCC)   Sarcoidosis   Atrial fibrillation (HCC)   Pulmonary hypertension (HCC)   Essential hypertension   Acute on chronic hypoxic respiratory failure -Likely combination of CHF and COPD -She received IV Lasix '40mg'$  x1 ib on April 11, bid on 12 , x1 on 4/13 -she  Reports worsening of sob started last night -she does not appear to be significant volume overload on exam , though weight is up from 117 on admission to 200 today , no significant wheezing on exam -initial cxr "Cardiomegaly with mild pulmonary vascular congestion. No evidence of frank edema" -will get cxr, mayn consider iv lasix pending cxr fingings, continue steroids, nebs -betablocker discontinued by cardiology due to concerning copd -consider outpatient lung function test if patient desires , to be determined by pcp -cardiology input appreciated   paf Currently in afib, rate controlled, on eliquis, toprol discontinued by cardiology due to concerning for  copd Cardiology recommend cardizem if needed for rate control  H/o sarcoidosis cxr no mention of  lung hilar and mediastinal nodal changes or lung parenchymal changes Outpatient follow up  AKI on CKDIIIb, anemia of chronic disease Cr improving  Hypokalemia: continue to replace  Body mass index is 34.4 kg/m.   Generalized deconditioning -PT eval.  Patient will return back to SNF  DVT Prophylaxis: eliquis  Code Status: DNR  Family Communication: patient   Disposition Plan:    Patient came from:            long term care at [facility] since Feb 15 of this year                                                                                               Anticipated d/c place:  return to long term care with home health  Barriers to d/c OR conditions which need to be met to effect a safe d/c: remain feeling sob, on iv steroids   Consultants:  Cardiology   Procedures:  none  Antibiotics:  none   Objective: BP 139/88 (BP Location: Left Arm)   Pulse 84   Temp 97.6 F (36.4 C) (Oral)   Resp 18   Ht '5\' 4"'$  (1.626 m)   Wt 90.9 kg  SpO2 97%   BMI 34.40 kg/m   Intake/Output Summary (Last 24 hours) at 05/08/2019 1023 Last data filed at 05/08/2019 1006 Gross per 24 hour  Intake 1523 ml  Output 500 ml  Net 1023 ml   Filed Weights   05/05/19 1908 05/07/19 0341 05/08/19 0601  Weight: 80 kg 90.6 kg 90.9 kg    Exam: Patient is examined daily including today on 05/08/2019, exams remain the same as of yesterday except that has changed    General:  pursed lip breathing  Cardiovascular: IRRR  Respiratory: overall very diminished , no significant wheezing  Abdomen: Soft/ND/NT, positive BS  Musculoskeletal: No Edema  Neuro: alert, oriented   Data Reviewed: Basic Metabolic Panel: Recent Labs  Lab 05/05/19 1920 05/05/19 2211 05/06/19 0959 05/07/19 0455 05/08/19 0459  NA 135  --  143 141 141  K 3.1*  --  3.3* 3.8 3.5  CL 86*  --  89* 86* 86*   CO2 35*  --  42* 42* 41*  GLUCOSE 116*  --  103* 172* 148*  BUN 42*  --  42* 49* 58*  CREATININE 2.42*  --  1.94* 1.53* 1.50*  CALCIUM 8.3*  --  8.3* 8.7* 9.1  MG  --  1.8 1.9  --  2.2   Liver Function Tests: Recent Labs  Lab 05/06/19 0959  AST 15  ALT 11  ALKPHOS 64  BILITOT 0.9  PROT 6.6  ALBUMIN 3.6   No results for input(s): LIPASE, AMYLASE in the last 168 hours. No results for input(s): AMMONIA in the last 168 hours. CBC: Recent Labs  Lab 05/05/19 1920 05/06/19 0959 05/07/19 0455 05/08/19 0459  WBC 8.0 7.9 7.0 7.6  NEUTROABS  --  6.1  --  7.0  HGB 10.5* 9.6* 10.0* 10.2*  HCT 34.2* 31.4* 32.6* 32.5*  MCV 97.7 98.7 97.0 95.6  PLT 288 251 260 271   Cardiac Enzymes:   No results for input(s): CKTOTAL, CKMB, CKMBINDEX, TROPONINI in the last 168 hours. BNP (last 3 results) Recent Labs    05/05/19 2211  BNP 363.0*    ProBNP (last 3 results) No results for input(s): PROBNP in the last 8760 hours.  CBG: No results for input(s): GLUCAP in the last 168 hours.  Recent Results (from the past 240 hour(s))  SARS CORONAVIRUS 2 (TAT 6-24 HRS) Nasopharyngeal Nasopharyngeal Swab     Status: None   Collection Time: 05/06/19 12:40 AM   Specimen: Nasopharyngeal Swab  Result Value Ref Range Status   SARS Coronavirus 2 NEGATIVE NEGATIVE Final    Comment: (NOTE) SARS-CoV-2 target nucleic acids are NOT DETECTED. The SARS-CoV-2 RNA is generally detectable in upper and lower respiratory specimens during the acute phase of infection. Negative results do not preclude SARS-CoV-2 infection, do not rule out co-infections with other pathogens, and should not be used as the sole basis for treatment or other patient management decisions. Negative results must be combined with clinical observations, patient history, and epidemiological information. The expected result is Negative. Fact Sheet for Patients: SugarRoll.be Fact Sheet for Healthcare  Providers: https://www.woods-mathews.com/ This test is not yet approved or cleared by the [place] FDA and  has been authorized for detection and/or diagnosis of SARS-CoV-2 by FDA under an Emergency Use Authorization (EUA). This EUA will remain  in effect (meaning this test can be used) for the duration of the COVID-19 declaration under Section 56 4(b)(1) of the Act, 21 U.S.C. section 360bbb-3(b)(1), unless the authorization is terminated or revoked sooner. Performed  at [facility], [place] [address], [place], [place] [address]      Studies: No results found.  Scheduled Meds: . apixaban  2.5 mg Oral BID  . ipratropium-albuterol  3 mL Nebulization QID  . methylPREDNISolone (SOLU-MEDROL) injection  40 mg Intravenous Q8H  . sodium chloride flush  3 mL Intravenous Once  . sodium chloride flush  3 mL Intravenous Q12H    Continuous Infusions: . sodium chloride       Time spent: 22mns I have personally reviewed and interpreted on  05/08/2019 daily labs, tele strips, imagings as discussed above under date review session and assessment and plans.  I reviewed all nursing notes, pharmacy notes, consultant notes,  vitals, pertinent old records  I have discussed plan of care as described above with RN , patient  on 05/08/2019   FFlorencia ReasonsMD, PhD, FACP  [facility]  Available via Epic secure chat 7am-7pm for nonurgent issues Please page for urgent issues, pager number available through [email or web address] .   05/08/2019, 10:23 AM  LOS: 2 days

## 2019-05-09 DIAGNOSIS — J449 Chronic obstructive pulmonary disease, unspecified: Secondary | ICD-10-CM | POA: Diagnosis not present

## 2019-05-09 DIAGNOSIS — I480 Paroxysmal atrial fibrillation: Secondary | ICD-10-CM | POA: Diagnosis not present

## 2019-05-09 DIAGNOSIS — I5033 Acute on chronic diastolic (congestive) heart failure: Secondary | ICD-10-CM | POA: Diagnosis not present

## 2019-05-09 DIAGNOSIS — E876 Hypokalemia: Secondary | ICD-10-CM | POA: Diagnosis not present

## 2019-05-09 DIAGNOSIS — N179 Acute kidney failure, unspecified: Secondary | ICD-10-CM | POA: Diagnosis not present

## 2019-05-09 LAB — MAGNESIUM: Magnesium: 2.2 mg/dL (ref 1.7–2.4)

## 2019-05-09 LAB — BASIC METABOLIC PANEL
Anion gap: 13 (ref 5–15)
BUN: 56 mg/dL — ABNORMAL HIGH (ref 8–23)
CO2: 42 mmol/L — ABNORMAL HIGH (ref 22–32)
Calcium: 9.1 mg/dL (ref 8.9–10.3)
Chloride: 86 mmol/L — ABNORMAL LOW (ref 98–111)
Creatinine, Ser: 1.42 mg/dL — ABNORMAL HIGH (ref 0.44–1.00)
GFR calc Af Amer: 38 mL/min — ABNORMAL LOW (ref >60)
GFR calc non Af Amer: 33 mL/min — ABNORMAL LOW (ref >60)
Glucose, Bld: 151 mg/dL — ABNORMAL HIGH (ref 70–99)
Potassium: 3.6 mmol/L (ref 3.5–5.1)
Sodium: 141 mmol/L (ref 135–145)

## 2019-05-09 MED ORDER — POTASSIUM CHLORIDE CRYS ER 20 MEQ PO TBCR
40.0000 meq | EXTENDED_RELEASE_TABLET | Freq: Once | ORAL | Status: AC
  Administered 2019-05-09: 40 meq via ORAL
  Filled 2019-05-09: qty 2

## 2019-05-09 MED ORDER — SENNOSIDES-DOCUSATE SODIUM 8.6-50 MG PO TABS
1.0000 | ORAL_TABLET | Freq: Two times a day (BID) | ORAL | Status: DC
  Administered 2019-05-09 – 2019-05-10 (×3): 1 via ORAL
  Filled 2019-05-09 (×3): qty 1

## 2019-05-09 MED ORDER — GUAIFENESIN ER 600 MG PO TB12
600.0000 mg | ORAL_TABLET | Freq: Two times a day (BID) | ORAL | Status: DC
  Administered 2019-05-09 – 2019-05-10 (×2): 600 mg via ORAL
  Filled 2019-05-09 (×2): qty 1

## 2019-05-09 MED ORDER — IPRATROPIUM-ALBUTEROL 0.5-2.5 (3) MG/3ML IN SOLN
3.0000 mL | Freq: Three times a day (TID) | RESPIRATORY_TRACT | Status: DC
  Administered 2019-05-09 – 2019-05-10 (×2): 3 mL via RESPIRATORY_TRACT
  Filled 2019-05-09 (×2): qty 3

## 2019-05-09 NOTE — Progress Notes (Signed)
Progress Note  Patient Name: Kari [name] Date of Encounter: 05/09/2019  Primary Cardiologist: Tobias Alexander, MD   Subjective   Remains dyspneic but improving; no chest pain  Inpatient Medications    Scheduled Meds: . apixaban  2.5 mg Oral BID  . ipratropium-albuterol  3 mL Nebulization QID  . methylPREDNISolone (SOLU-MEDROL) injection  40 mg Intravenous Q8H  . sodium chloride flush  3 mL Intravenous Once  . sodium chloride flush  3 mL Intravenous Q12H   Continuous Infusions: . sodium chloride     PRN Meds: sodium chloride, acetaminophen, ondansetron (ZOFRAN) IV, sodium chloride flush   Vital Signs    Vitals:   05/08/19 2300 05/09/19 0517 05/09/19 0540 05/09/19 0836  BP:   (!) 152/78   Pulse:   91 89  Resp:   20 20  Temp:   98.2 F (36.8 C)   TempSrc:   Oral   SpO2: 94%  98% 95%  Weight:  90.7 kg    Height:        Intake/Output Summary (Last 24 hours) at 05/09/2019 0954 Last data filed at 05/09/2019 0141 Gross per 24 hour  Intake 843 ml  Output 1150 ml  Net -307 ml   Last 3 Weights 05/09/2019 05/08/2019 05/07/2019  Weight (lbs) 199 lb 14.4 oz 200 lb 6.4 oz 199 lb 11.2 oz  Weight (kg) 90.674 kg 90.901 kg 90.583 kg  Some encounter information is confidential and restricted. Go to Review Flowsheets activity to see all data.      Telemetry    Atrial fibrillation, rate controlled- Personally Reviewed  Physical Exam   GEN: No acute distress. WD, chronically ill appearing   Neck: supple Cardiac: irregular, normal rate Respiratory:  Diminished breath sounds throughout, no wheeze; no rhonchi GI: Soft, NT/ND MS: No edema Neuro:  Grossly intact Psych: Normal affect   Labs    High Sensitivity Troponin:   Recent Labs  Lab 05/05/19 2211 05/06/19 0145 05/06/19 0959  TROPONINIHS 26* 25* 26*      Chemistry Recent Labs  Lab 05/06/19 0959 05/06/19 0959 05/07/19 0455 05/08/19 0459 05/09/19 0522  NA 143   < > 141 141 141  K 3.3*   < > 3.8 3.5 3.6    CL 89*   < > 86* 86* 86*  CO2 42*   < > 42* 41* 42*  GLUCOSE 103*   < > 172* 148* 151*  BUN 42*   < > 49* 58* 56*  CREATININE 1.94*   < > 1.53* 1.50* 1.42*  CALCIUM 8.3*   < > 8.7* 9.1 9.1  PROT 6.6  --   --   --   --   ALBUMIN 3.6  --   --   --   --   AST 15  --   --   --   --   ALT 11  --   --   --   --   ALKPHOS 64  --   --   --   --   BILITOT 0.9  --   --   --   --   GFRNONAA 22*   < > 30* 31* 33*  GFRAA 26*   < > 35* 35* 38*  ANIONGAP 12   < > 13 14 13    < > = values in this interval not displayed.     Hematology Recent Labs  Lab 05/06/19 0959 05/07/19 0455 05/08/19 0459  WBC 7.9 7.0 7.6  RBC 3.18* 3.36*  3.40*  HGB 9.6* 10.0* 10.2*  HCT 31.4* 32.6* 32.5*  MCV 98.7 97.0 95.6  MCH 30.2 29.8 30.0  MCHC 30.6 30.7 31.4  RDW 15.3 14.7 15.0  PLT 251 260 271    BNP Recent Labs  Lab 05/05/19 2211  BNP 363.0*      Radiology    DG CHEST PORT 1 VIEW  Result Date: 05/08/2019 CLINICAL DATA:  Follow-up evaluation for shortness of breath EXAM: PORTABLE CHEST 1 VIEW COMPARISON:  05/05/2019 FINDINGS: Cardiomegaly. Both lungs are clear. The visualized skeletal structures are unremarkable. IMPRESSION: Cardiomegaly without acute abnormality of the lungs in AP portable projection. Electronically Signed   By: Eddie Candle M.D.   On: 05/08/2019 11:19    Patient Profile     86 year old female with past medical history of persistent atrial fibrillation, chronic diastolic congestive heart failure, moderate to severe mitral regurgitation by prior echocardiogram, COPD on chronic home oxygen, sarcoid for evaluation of acute on chronic diastolic congestive heart failure.    Echocardiogram this admission shows normal LV function, moderate pulmonary hypertension, moderate biatrial enlargement, mild mitral regurgitation, mild aortic stenosis, moderate aortic insufficiency, mildly dilated aortic root.  Last echocardiogram performed at [facility] March 2020 showed normal LV function, moderate to  moderately severe mitral regurgitation, moderately severe pulmonary hypertension, mild aortic insufficiency/aortic stenosis.   Assessment & Plan    1 COPD-Pt improving compared to admission.  Continue bronchodilators and steroids.  2 acute on chronic diastolic congestive heart failure-LV function is normal.  I do not think she is significantly volume overloaded on examination.  I would hold diuretics for now and we will resume lower dose at discharge.  3 permanent atrial fibrillation-beta-blocker discontinued due to COPD.  Heart rate remains normal on no AV nodal blocking agents.  Can add Cardizem later if needed.  Continue apixaban.  4 aortic stenosis/aortic insufficiency-follow-up Dr. Meda Coffee after discharge.  5 acute on chronic stage III kidney disease-follow renal function.  Diuretics on hold.  6 no CODE BLUE  For questions or updates, please contact [place] Please consult [email or web address] for contact info under        Signed, Kirk Ruths, MD  05/09/2019, 9:54 AM

## 2019-05-09 NOTE — Progress Notes (Addendum)
PROGRESS NOTE  Kari [name] [id number]:[id number] DOB: 09-15-1928 DOA: 05/05/2019 PCP: Algernon Huxley, MD   Brief Narrative:  86 year old female with history of chronic diastolic CHF, COPD, chronic kidney disease stage III, hypothyroidism, valvular heart disease, GERD, sarcoidosis presented from skilled nursing facility with worsening lower extremity edema and shortness of breath.  On presentation, chest x-ray showed cardiomegaly with mild pulmonary vascular congestion with no frank edema.  She was admitted and started on IV Lasix.  Subsequently, Solu-Medrol was also started for wheezing.  Cardiology was consulted.   HPI/Recap of past 24 hours:  Remain dyspneic at rest, some intermittent congested cough Denies chest pain, no significant edema She is on 2 L oxygen, no hypoxia She sleeps in a recliner at night due to dyspnea   Assessment/Plan: Principal Problem:   Acute exacerbation of CHF (congestive heart failure) (HCC) Active Problems:   MITRAL VALVE INSUFF&AORTIC VALVE INSUFF   GERD (gastroesophageal reflux disease)   Asthma with COPD (HCC)   Sarcoidosis   Atrial fibrillation (HCC)   Pulmonary hypertension (HCC)   Essential hypertension   Acute on chronic hypoxic respiratory failure, reports on home o2 2liters for the last year -Likely combination of CHF and COPD -She received IV Lasix '40mg'$  x1 ib on April 11, bid on 12 , x1 on 4/13, x1 on 4/14 - no significant wheezing on exam, does has left lower low rales, overall very diminished breath sound -initial cxr "Cardiomegaly with mild pulmonary vascular congestion. No evidence of frank edema" -repeat cxr on 4/14 , no significant sign of vascular congestion,  -continue steroids, nebs -betablocker discontinued by cardiology due to concerning copd -consider outpatient lung function test if patient desires , to be determined by pcp -cardiology input appreciated   paf Currently in afib, rate controlled, on eliquis, toprol discontinued  by cardiology due to concerning for copd Cardiology recommend cardizem if needed for rate control  H/o sarcoidosis cxr no mention of  lung hilar and mediastinal nodal changes or lung parenchymal changes Outpatient follow up  AKI on CKDIIIb, anemia of chronic disease Cr improving  Hypokalemia: remain low, continue to replace  History of large hiatal hernia, has been sleeping in recliner in the last few years.  Body mass index is 34.31 kg/m.   Generalized deconditioning/FTT -PT eval, patient got exhausted after standing up for 91mns -per daughter patient has progressive short of breath over the last 699-monthdaughter wants pulmonary  input and also agreed to talk to palliative care  DVT Prophylaxis: eliquis  Code Status: DNR  Family Communication: daughter who is a home health RN over the phone  Disposition Plan:    [facility]                                                                                               Anticipated d/c place:  return to long term care with home health  Barriers to d/c OR conditions which need to be met to effect a safe d/c:  remain feeling sob, on iv steroids   Consultants:  Cardiology  Pulmonology  Palliative care   Procedures:  none  Antibiotics:  none   Objective: BP (!) 152/78 (BP Location: Left Arm)   Pulse 89   Temp 98.2 F (36.8 C) (Oral)   Resp 20   Ht '5\' 4"'$  (1.626 m)   Wt 90.7 kg   SpO2 95%   BMI 34.31 kg/m   Intake/Output Summary (Last 24 hours) at 05/09/2019 1220 Last data filed at 05/09/2019 0141 Gross per 24 hour  Intake 840 ml  Output 950 ml  Net -110 ml   Filed Weights   05/07/19 0341 05/08/19 0601 05/09/19 0517  Weight: 90.6 kg 90.9 kg 90.7 kg    Exam: Patient is examined daily including today on 05/09/2019, exams remain the same as of yesterday except that has changed    General:  pursed lip  breathing  Cardiovascular: IRRR  Respiratory: overall very diminished , no significant wheezing, left lower lobe rales  Abdomen: Soft/ND/NT, positive BS  Musculoskeletal: No Edema  Neuro: alert, slightly confused about the month, but  oriented to year, place and person  Data Reviewed: Basic Metabolic Panel: Recent Labs  Lab 05/05/19 1920 05/05/19 2211 05/06/19 0959 05/07/19 0455 05/08/19 0459 05/09/19 0522  NA 135  --  143 141 141 141  K 3.1*  --  3.3* 3.8 3.5 3.6  CL 86*  --  89* 86* 86* 86*  CO2 35*  --  42* 42* 41* 42*  GLUCOSE 116*  --  103* 172* 148* 151*  BUN 42*  --  42* 49* 58* 56*  CREATININE 2.42*  --  1.94* 1.53* 1.50* 1.42*  CALCIUM 8.3*  --  8.3* 8.7* 9.1 9.1  MG  --  1.8 1.9  --  2.2 2.2   Liver Function Tests: Recent Labs  Lab 05/06/19 0959  AST 15  ALT 11  ALKPHOS 64  BILITOT 0.9  PROT 6.6  ALBUMIN 3.6   No results for input(s): LIPASE, AMYLASE in the last 168 hours. No results for input(s): AMMONIA in the last 168 hours. CBC: Recent Labs  Lab 05/05/19 1920 05/06/19 0959 05/07/19 0455 05/08/19 0459  WBC 8.0 7.9 7.0 7.6  NEUTROABS  --  6.1  --  7.0  HGB 10.5* 9.6* 10.0* 10.2*  HCT 34.2* 31.4* 32.6* 32.5*  MCV 97.7 98.7 97.0 95.6  PLT 288 251 260 271   Cardiac Enzymes:   No results for input(s): CKTOTAL, CKMB, CKMBINDEX, TROPONINI in the last 168 hours. BNP (last 3 results) Recent Labs    05/05/19 2211  BNP 363.0*    ProBNP (last 3 results) No results for input(s): PROBNP in the last 8760 hours.  CBG: No results for input(s): GLUCAP in the last 168 hours.  Recent Results (from the past 240 hour(s))  SARS CORONAVIRUS 2 (TAT 6-24 HRS) Nasopharyngeal Nasopharyngeal Swab     Status: None   Collection Time: 05/06/19 12:40 AM   Specimen: Nasopharyngeal Swab  Result Value Ref Range Status   SARS Coronavirus 2 NEGATIVE NEGATIVE Final    Comment: (NOTE) SARS-CoV-2 target nucleic acids are NOT DETECTED. The SARS-CoV-2 RNA is  generally detectable in upper and lower respiratory specimens during the acute phase of infection. Negative results do not preclude SARS-CoV-2 infection, do not rule out co-infections with other pathogens, and should not be used as the sole basis for treatment or other patient management decisions. Negative results must be combined with clinical observations, patient history,  and epidemiological information. The expected result is Negative. Fact Sheet for Patients: SugarRoll.be Fact Sheet for Healthcare Providers: https://www.woods-mathews.com/ This test is not yet approved or cleared by the [place] FDA and  has been authorized for detection and/or diagnosis of SARS-CoV-2 by FDA under an Emergency Use Authorization (EUA). This EUA will remain  in effect (meaning this test can be used) for the duration of the COVID-19 declaration under Section 56 4(b)(1) of the Act, 21 U.S.C. section 360bbb-3(b)(1), unless the authorization is terminated or revoked sooner. Performed at [facility], [place] [address], [place], [place] [address]      Studies: No results found.  Scheduled Meds: . apixaban  2.5 mg Oral BID  . ipratropium-albuterol  3 mL Nebulization QID  . methylPREDNISolone (SOLU-MEDROL) injection  40 mg Intravenous Q8H  . sodium chloride flush  3 mL Intravenous Once  . sodium chloride flush  3 mL Intravenous Q12H    Continuous Infusions: . sodium chloride       Time spent: 26mns I have personally reviewed and interpreted on  05/09/2019 daily labs, tele strips, imagings as discussed above under date review session and assessment and plans.  I reviewed all nursing notes, pharmacy notes, consultant notes,  vitals, pertinent old records  I have discussed plan of care as described above with RN , patient  on 05/09/2019   FFlorencia ReasonsMD, PhD, FACP  [facility]  Available via Epic secure chat 7am-7pm for nonurgent  issues Please page for urgent issues, pager number available through [email or web address] .   05/09/2019, 12:20 PM  LOS: 3 days

## 2019-05-09 NOTE — Progress Notes (Addendum)
Physical Therapy Treatment Patient Details Name: Kari [name] MRN: [id number] DOB: [birth year] Today's Date: 05/09/2019    History of Present Illness 86 year old female with history of chronic diastolic CHF, COPD, chronic kidney disease stage III, hypothyroidism, valvular heart disease, GERD, sarcoidosis presented from skilled nursing facility with worsening lower extremity edema and shortness of breath.     PT Comments    Pt found slid down in recliner on entry. Pt reports that she has "that Stick" but does not think it is working. When assessed Purewick found dislodged and seat was wet. Pt agreeable to stand and change linens and gown. Pt is min guard for sit to stand and standing of approximately 5 minutes for cleaning and placement of clean linens. Pt sat back in recliner, reporting exhaustion and not wanting to ambulate with therapy today. Pt agreeable to minimal exercise and requested warm blankets to be able to take a nap. D/c plans remain appropriate at this time. PT will continue to follow acutely.    Follow Up Recommendations  SNF     Equipment Recommendations  None recommended by PT       Precautions / Restrictions Precautions Precautions: Fall Restrictions Weight Bearing Restrictions: No    Mobility  Bed Mobility               General bed mobility comments: OOB in recliner on entry   Transfers Overall transfer level: Needs assistance Equipment used: 4-wheeled walker Transfers: Sit to/from Stand;Stand Pivot Transfers Sit to Stand: Min guard         General transfer comment: min guard for safety with sit to stand to Rollator   Ambulation/Gait             General Gait Details: pt refused ambulation        Balance Overall balance assessment: Needs assistance Sitting-balance support: No upper extremity supported;Feet supported Sitting balance-Leahy Scale: Fair     Standing balance support: Bilateral upper extremity supported;During functional  activity Standing balance-Leahy Scale: Poor Standing balance comment: relies on RW for support.                             Cognition Arousal/Alertness: Awake/alert Behavior During Therapy: WFL for tasks assessed/performed Overall Cognitive Status: Within Functional Limits for tasks assessed                                        Exercises General Exercises - Lower Extremity Long Arc Quad: AROM;Both;10 reps;Seated    General Comments  VSS on 2L O2 via Reform      Pertinent Vitals/Pain Pain Assessment: No/denies pain           PT Goals (current goals can now be found in the care plan section) Acute Rehab PT Goals Patient Stated Goal: to go home PT Goal Formulation: With patient Time For Goal Achievement: 05/21/19 Potential to Achieve Goals: Good Progress towards PT goals: Not progressing toward goals - comment(pt fatigue limiting session )    Frequency    Min 3X/week      PT Plan Current plan remains appropriate       AM-PAC PT "6 Clicks" Mobility   Outcome Measure  Help needed turning from your back to your side while in a flat bed without using bedrails?: None Help needed moving from lying on your back to sitting on the side  of a flat bed without using bedrails?: None Help needed moving to and from a bed to a chair (including a wheelchair)?: A Little Help needed standing up from a chair using your arms (e.g., wheelchair or bedside chair)?: A Little Help needed to walk in hospital room?: A Little Help needed climbing 3-5 steps with a railing? : A Lot 6 Click Score: 19    End of Session Equipment Utilized During Treatment: Gait belt;Oxygen Activity Tolerance: Patient limited by fatigue Patient left: in chair;with call bell/phone within reach;with chair alarm set Nurse Communication: Mobility status PT Visit Diagnosis: Unsteadiness on feet (R26.81);History of falling (Z91.81);Muscle weakness (generalized) (M62.81)     Time:  2595-6387 PT Time Calculation (min) (ACUTE ONLY): 19 min  Charges:  $Therapeutic Activity: 8-22 mins                     Agusta Hackenberg B. Migdalia Dk PT, DPT Acute Rehabilitation Services Pager [phone] Office [phone]    [place] 05/09/2019, 4:31 PM

## 2019-05-09 NOTE — Care Management Important Message (Signed)
Important Message  Patient Details  Name: Kari [name] MRN: [id number] Date of Birth: 10-15-1928   Medicare Important Message Given:  Yes     Renie Ora 05/09/2019, 9:16 AM

## 2019-05-10 ENCOUNTER — Telehealth: Payer: Self-pay | Admitting: Acute Care

## 2019-05-10 DIAGNOSIS — Z7189 Other specified counseling: Secondary | ICD-10-CM | POA: Diagnosis not present

## 2019-05-10 DIAGNOSIS — J449 Chronic obstructive pulmonary disease, unspecified: Secondary | ICD-10-CM | POA: Diagnosis not present

## 2019-05-10 DIAGNOSIS — Z515 Encounter for palliative care: Secondary | ICD-10-CM

## 2019-05-10 DIAGNOSIS — D869 Sarcoidosis, unspecified: Secondary | ICD-10-CM | POA: Diagnosis not present

## 2019-05-10 DIAGNOSIS — I509 Heart failure, unspecified: Secondary | ICD-10-CM

## 2019-05-10 DIAGNOSIS — I5033 Acute on chronic diastolic (congestive) heart failure: Secondary | ICD-10-CM | POA: Diagnosis not present

## 2019-05-10 DIAGNOSIS — J441 Chronic obstructive pulmonary disease with (acute) exacerbation: Secondary | ICD-10-CM | POA: Diagnosis not present

## 2019-05-10 DIAGNOSIS — I480 Paroxysmal atrial fibrillation: Secondary | ICD-10-CM | POA: Diagnosis not present

## 2019-05-10 DIAGNOSIS — I080 Rheumatic disorders of both mitral and aortic valves: Secondary | ICD-10-CM

## 2019-05-10 LAB — BASIC METABOLIC PANEL
Anion gap: 15 (ref 5–15)
BUN: 48 mg/dL — ABNORMAL HIGH (ref 8–23)
CO2: 38 mmol/L — ABNORMAL HIGH (ref 22–32)
Calcium: 9.6 mg/dL (ref 8.9–10.3)
Chloride: 89 mmol/L — ABNORMAL LOW (ref 98–111)
Creatinine, Ser: 1.2 mg/dL — ABNORMAL HIGH (ref 0.44–1.00)
GFR calc Af Amer: 46 mL/min — ABNORMAL LOW (ref >60)
GFR calc non Af Amer: 40 mL/min — ABNORMAL LOW (ref >60)
Glucose, Bld: 142 mg/dL — ABNORMAL HIGH (ref 70–99)
Potassium: 4.4 mmol/L (ref 3.5–5.1)
Sodium: 142 mmol/L (ref 135–145)

## 2019-05-10 MED ORDER — APIXABAN 5 MG PO TABS
5.0000 mg | ORAL_TABLET | Freq: Two times a day (BID) | ORAL | Status: DC
  Administered 2019-05-10: 5 mg via ORAL
  Filled 2019-05-10: qty 1

## 2019-05-10 MED ORDER — FUROSEMIDE 40 MG PO TABS: 40.0000 mg | ORAL_TABLET | Freq: Every day | ORAL | 0 refills | Status: AC

## 2019-05-10 MED ORDER — SENNOSIDES-DOCUSATE SODIUM 8.6-50 MG PO TABS: 1.0000 | ORAL_TABLET | Freq: Two times a day (BID) | ORAL | 0 refills | Status: AC

## 2019-05-10 MED ORDER — POTASSIUM CHLORIDE CRYS ER 20 MEQ PO TBCR: 20.0000 meq | EXTENDED_RELEASE_TABLET | Freq: Every day | ORAL | 0 refills | Status: AC

## 2019-05-10 MED ORDER — IPRATROPIUM-ALBUTEROL 0.5-2.5 (3) MG/3ML IN SOLN: 3.0000 mL | Freq: Four times a day (QID) | RESPIRATORY_TRACT | Status: DC | PRN

## 2019-05-10 MED ORDER — DILTIAZEM HCL 30 MG PO TABS: 30.0000 mg | ORAL_TABLET | Freq: Two times a day (BID) | ORAL | 0 refills | Status: AC

## 2019-05-10 MED ORDER — FUROSEMIDE 40 MG PO TABS
40.0000 mg | ORAL_TABLET | Freq: Every day | ORAL | Status: DC
  Administered 2019-05-10: 40 mg via ORAL
  Filled 2019-05-10: qty 1

## 2019-05-10 MED ORDER — UMECLIDINIUM BROMIDE 62.5 MCG/INH IN AEPB: 1.0000 | INHALATION_SPRAY | Freq: Every day | RESPIRATORY_TRACT | 0 refills | Status: AC

## 2019-05-10 MED ORDER — CLONAZEPAM 0.5 MG PO TABS: 0.2500 mg | ORAL_TABLET | Freq: Four times a day (QID) | ORAL | Status: DC | PRN

## 2019-05-10 MED ORDER — UMECLIDINIUM BROMIDE 62.5 MCG/INH IN AEPB: 1.0000 | INHALATION_SPRAY | Freq: Every day | RESPIRATORY_TRACT | Status: DC

## 2019-05-10 MED ORDER — MORPHINE SULFATE (CONCENTRATE) 10 MG/0.5ML PO SOLN
2.5000 mg | Freq: Once | ORAL | Status: DC
  Filled 2019-05-10: qty 0.5

## 2019-05-10 MED ORDER — UMECLIDINIUM BROMIDE 62.5 MCG/INH IN AEPB
1.0000 | INHALATION_SPRAY | Freq: Every day | RESPIRATORY_TRACT | Status: DC
  Administered 2019-05-10: 1 via RESPIRATORY_TRACT
  Filled 2019-05-10: qty 7

## 2019-05-10 MED ORDER — FERROUS SULFATE 325 (65 FE) MG PO TABS: 325.0000 mg | ORAL_TABLET | ORAL | 0 refills | Status: AC

## 2019-05-10 MED ORDER — CLONAZEPAM 0.25 MG PO TBDP: 0.2500 mg | ORAL_TABLET | Freq: Four times a day (QID) | ORAL | Status: DC | PRN

## 2019-05-10 MED ORDER — POTASSIUM CHLORIDE CRYS ER 20 MEQ PO TBCR
20.0000 meq | EXTENDED_RELEASE_TABLET | Freq: Every day | ORAL | Status: DC
  Administered 2019-05-10: 20 meq via ORAL
  Filled 2019-05-10: qty 1

## 2019-05-10 NOTE — TOC Progression Note (Addendum)
Transition of Care [facility]) - Progression Note    Patient Details  Name: Analy Bassford MRN: [id number] Date of Birth: Jan 16, 1929  Transition of Care [facility]) CM/SW Contact  Gildardo Griffes, [place] Phone Number: 05/10/2019, 8:41 AM  Clinical Narrative:     CSW spoke with [facility] admissions Roanna Epley regarding patient's return, potential dc back to [facility] today or tomorrow. If today, facility reports no updated covid test needed as they can do rapid upon her return. However, if patient is to discharge tomorrow orders for an updated covid test will need to be put in today. MD to inform [facility] team of discharge time frame by this afternoon.   CSW spoke with Vangie Bicker with [facility] and made referral for outpatient palliative services to follow at [facility] upon dc.   Expected Discharge Plan: Skilled Nursing Facility Barriers to Discharge: Continued Medical Work up  Expected Discharge Plan and Services Expected Discharge Plan: Skilled Nursing Facility     Post Acute Care Choice: Skilled Nursing Facility Living arrangements for the past 2 months: Skilled Nursing Facility([facility] since Mar 11 2019)                                       Social Determinants of Health (SDOH) Interventions    Readmission Risk Interventions No flowsheet data found.

## 2019-05-10 NOTE — TOC Transition Note (Signed)
Transition of Care [facility]) - CM/SW Discharge Note   Patient Details  Name: Kari [name] MRN: [id number] Date of Birth: 17-Jul-1928  Transition of Care [facility]) CM/SW Contact:  Gildardo Griffes, LCSW Phone Number: 05/10/2019, 11:44 AM   Clinical Narrative:     Patient will DC to: [facility] Anticipated DC date: 05/10/19 Family notified: Milnette Transport by: Sharin Mons  Per MD patient ready for DC to [facility]. RN, patient, patient's family, and facility notified of DC. Discharge Summary sent to facility. RN given number for report   [phone] Room 23. DC packet on chart. Ambulance transport requested for patient.  CSW signing off.  [place], [place] [phone]   Final next level of care: Skilled Nursing Facility Barriers to Discharge: No Barriers Identified   Patient Goals and CMS Choice   CMS Medicare.gov Compare Post Acute Care list provided to:: Patient Represenative (must comment)(daughter Milnette) Choice offered to / list presented to : Adult Children  Discharge Placement              Patient chooses bed at: [facility] Patient to be transferred to facility by: [facility] Name of family member notified: Milnette Patient and family notified of of transfer: 05/10/19  Discharge Plan and Services     Post Acute Care Choice: Skilled Nursing Facility                               Social Determinants of Health (SDOH) Interventions     Readmission Risk Interventions No flowsheet data found.

## 2019-05-10 NOTE — Progress Notes (Signed)
Progress Note  Patient Name: Kari [name] Date of Encounter: 05/10/2019  Primary Cardiologist: Tobias Alexander, MD   Subjective   Remains dyspneic but improved; no chest pain  Inpatient Medications    Scheduled Meds: . apixaban  5 mg Oral BID  . guaiFENesin  600 mg Oral BID  . methylPREDNISolone (SOLU-MEDROL) injection  40 mg Intravenous Q8H  . morphine CONCENTRATE  2.6 mg Oral Once  . senna-docusate  1 tablet Oral BID  . sodium chloride flush  3 mL Intravenous Once  . sodium chloride flush  3 mL Intravenous Q12H  . umeclidinium bromide  1 puff Inhalation Daily   Continuous Infusions: . sodium chloride     PRN Meds: sodium chloride, acetaminophen, clonazepam, ipratropium-albuterol, ondansetron (ZOFRAN) IV, sodium chloride flush   Vital Signs    Vitals:   05/09/19 1900 05/09/19 2051 05/10/19 0431 05/10/19 0837  BP:  118/71 137/79   Pulse: 92 91 67 86  Resp: 18 18 18 18   Temp:  98.3 F (36.8 C) 98.1 F (36.7 C)   TempSrc:  Oral Oral   SpO2: 94% 94% 98% 97%  Weight:   90.8 kg   Height:        Intake/Output Summary (Last 24 hours) at 05/10/2019 1118 Last data filed at 05/10/2019 0050 Gross per 24 hour  Intake 720 ml  Output 500 ml  Net 220 ml   Last 3 Weights 05/10/2019 05/09/2019 05/08/2019  Weight (lbs) 200 lb 3.2 oz 199 lb 14.4 oz 200 lb 6.4 oz  Weight (kg) 90.81 kg 90.674 kg 90.901 kg  Some encounter information is confidential and restricted. Go to Review Flowsheets activity to see all data.      Telemetry    Atrial fibrillation, rate controlled- Personally Reviewed  Physical Exam   GEN: WD WN Neck: supple, no JVD Cardiac: irregular Respiratory:  Diminished breath sounds throughout GI: Soft, NT/ND, no masses MS: No edema Neuro:  No focal findings Psych: Normal affect   Labs    High Sensitivity Troponin:   Recent Labs  Lab 05/05/19 2211 05/06/19 0145 05/06/19 0959  TROPONINIHS 26* 25* 26*      Chemistry Recent Labs  Lab  05/06/19 0959 05/07/19 0455 05/08/19 0459 05/09/19 0522 05/10/19 0448  NA 143   < > 141 141 142  K 3.3*   < > 3.5 3.6 4.4  CL 89*   < > 86* 86* 89*  CO2 42*   < > 41* 42* 38*  GLUCOSE 103*   < > 148* 151* 142*  BUN 42*   < > 58* 56* 48*  CREATININE 1.94*   < > 1.50* 1.42* 1.20*  CALCIUM 8.3*   < > 9.1 9.1 9.6  PROT 6.6  --   --   --   --   ALBUMIN 3.6  --   --   --   --   AST 15  --   --   --   --   ALT 11  --   --   --   --   ALKPHOS 64  --   --   --   --   BILITOT 0.9  --   --   --   --   GFRNONAA 22*   < > 31* 33* 40*  GFRAA 26*   < > 35* 38* 46*  ANIONGAP 12   < > 14 13 15    < > = values in this interval not displayed.  Hematology Recent Labs  Lab 05/06/19 0959 05/07/19 0455 05/08/19 0459  WBC 7.9 7.0 7.6  RBC 3.18* 3.36* 3.40*  HGB 9.6* 10.0* 10.2*  HCT 31.4* 32.6* 32.5*  MCV 98.7 97.0 95.6  MCH 30.2 29.8 30.0  MCHC 30.6 30.7 31.4  RDW 15.3 14.7 15.0  PLT 251 260 271    BNP Recent Labs  Lab 05/05/19 2211  BNP 363.0*      Radiology    No results found.  Patient Profile     86 year old female with past medical history of persistent atrial fibrillation, chronic diastolic congestive heart failure, moderate to severe mitral regurgitation by prior echocardiogram, COPD on chronic home oxygen, sarcoid for evaluation of acute on chronic diastolic congestive heart failure.    Echocardiogram this admission shows normal LV function, moderate pulmonary hypertension, moderate biatrial enlargement, mild mitral regurgitation, mild aortic stenosis, moderate aortic insufficiency, mildly dilated aortic root.  Last echocardiogram performed at [facility] March 2020 showed normal LV function, moderate to moderately severe mitral regurgitation, moderately severe pulmonary hypertension, mild aortic insufficiency/aortic stenosis.   Assessment & Plan    1 COPD-Pt improving compared to admission.  Continue pulmonary toilet per primary care.  2 acute on chronic diastolic  congestive heart failure-LV function is normal.  I think predominant issue was COPD at time of admission.  I will resume Lasix 40 mg daily and K-Dur 20 mEq daily.  Will need close follow-up of potassium and renal function following discharge.  3 permanent atrial fibrillation-beta-blocker discontinued due to COPD.  Heart rate remains normal on no AV nodal blocking agents.  Continue apixaban.  4 aortic stenosis/aortic insufficiency-follow-up Dr. Meda Coffee after discharge.  5 acute on chronic stage III kidney disease-follow renal function.  6 no CODE BLUE  For questions or updates, please contact [place] Please consult [email or web address] for contact info under        Signed, Kirk Ruths, MD  05/10/2019, 11:18 AM

## 2019-05-10 NOTE — Consult Note (Addendum)
Consultation Note Date: 05/10/2019   Patient Name: Kari [name]  DOB: 1928/05/18  MRN: [id number]  Age / Sex: 86 y.o., female  PCP: Algernon Huxley, MD Referring Physician: Florencia Reasons, MD  Reason for Consultation: Establishing goals of care  HPI/Patient Profile: 86 y.o. female  with past medical history of chronic diastolic CHF, COPD, and CKD stage 3 admitted on 05/05/2019 with SOB and pulmonary congestion in the setting of acute exacerbation of CHF. Following diuretic therapy, she has remained intermittently dyspneic although oxygenation has improved.   Clinical Assessment and Goals of Care:  This NP Elie Confer along with Vinie Sill NP reviewed medical records, and then met at the patient's bedside to discuss diagnosis, prognosis, goals of care, disposition, and options. After talking with the patient, we talked to daughter and Millette via phone. Daughter Alveta Heimlich is her mother's HPOA and is an Therapist, sports.   Functional status prior to admission: Patient lives at [facility]. She states she was able to ambulate with a walker. Millette reports an overall decline in status over the past 6 months maybe longer. Both patient and daughter report progressively worsening shortness of breath, even at rest.   The concept of Hospice and Palliative Care were reviewed. We discussed the diagnosis and the natural trajectory of heart failure and COPD. We emphasized the need for adequate symptom management regarding her dyspnea and that hospice would be most appropriate to provide this management.  Values and goals of care important to patient and family were attempted to be elicited. Patient states she is most concerned about her breathing and "panic attacks". We discussed that hospice will be a more appropriate option for management of these symptoms. Patient is very open to hospice, with the goals of care being to  focus on comfort and to provide relief for her dyspnea and anxiety.   Questions and concerns were addressed.  PMT will continue to support holistically.  Discussed with Dr. Erlinda Hong, CSW Pricilla Riffle, and hospice liaison Freddie Breech.     SUMMARY OF RECOMMENDATIONS    Code Status/Advance Care Planning:  DNR   Symptom Management:   Trial dose of morphine oral solution (roxinol) while in hospital. Multiple allergies noted; Millette reports these are not true allergies but that her mother has not wanted to take medication in the past.   Palliative Prophylaxis:   Frequent Pain Assessment  Prognosis:   < 6 months  Discharge Planning: [place] with Hospice      Primary Diagnoses: Present on Admission: . GERD (gastroesophageal reflux disease) . MITRAL VALVE INSUFF&AORTIC VALVE INSUFF . Asthma with COPD ([place]) . Pulmonary hypertension ([place]) . Atrial fibrillation ([place]) . Essential hypertension   I have reviewed the medical record, interviewed the patient and family, and examined the patient. The following aspects are pertinent.  Past Medical History:  Diagnosis Date  . Abdominal pain   . Arthritis   . Asthma   . Chronic diastolic CHF (congestive heart failure) ([place])   . Chronic respiratory failure ([place])  a. on 2L home O2 chronically.  . Complication of anesthesia    BP drops  . COPD (chronic obstructive pulmonary disease) ([place])   . COVID-19 virus infection 01/2019  . Dilated aortic root ([place])   . Diverticulosis   . GERD (gastroesophageal reflux disease)   . Helicobacter pylori gastritis 11/2011  . Hiatal hernia   . History of echocardiogram    Echo 3/17: Mild LVH, EF 55-60%, mild aortic stenosis (peak 10 mmHg), mild MR, mild LAE  . History of gallstones   . History of scarlet fever   . MI, old   . Mild AI (aortic insufficiency)   . Mild aortic stenosis   . Mitral regurgitation   . Osteoporosis   . PAF (paroxysmal atrial fibrillation) ([place])   .  Premature atrial contractions   . Pulmonary hypertension ([place])   . Sarcoid    pulmonary and dermatologic  . Sarcoidosis, lung ([place])   . Thyroid disease   . Zenker's diverticulum    Social History   Socioeconomic History  . Marital status: Divorced    Spouse name: Not on file  . Number of children: 4  . Years of education: Not on file  . Highest education level: Not on file  Occupational History  . Occupation: clergy  Tobacco Use  . Smoking status: Former Smoker    Packs/day: 2.00    Years: 10.00    Pack years: 20.00    Types: Cigarettes, Pipe    Quit date: 01/25/1968    Years since quitting: 51.3  . Smokeless tobacco: Never Used  . Tobacco comment: pt unsure of exact month in 1977  Substance and Sexual Activity  . Alcohol use: Yes    Alcohol/week: 0.0 standard drinks    Comment: seldom...glass wine  . Drug use: No  . Sexual activity: Not on file  Other Topics Concern  . Not on file  Social History Narrative   ** Merged History Encounter **       Social Determinants of Health   Financial Resource Strain:   . Difficulty of Paying Living Expenses:   Food Insecurity:   . Worried About Charity fundraiser in the Last Year:   . Arboriculturist in the Last Year:   Transportation Needs:   . Film/video editor (Medical):   Marland Kitchen Lack of Transportation (Non-Medical):   Physical Activity:   . Days of Exercise per Week:   . Minutes of Exercise per Session:   Stress:   . Feeling of Stress :   Social Connections:   . Frequency of Communication with Friends and Family:   . Frequency of Social Gatherings with Friends and Family:   . Attends Religious Services:   . Active Member of Clubs or Organizations:   . Attends Archivist Meetings:   Marland Kitchen Marital Status:    Family History  Problem Relation Age of Onset  . Diabetes Father   . Diabetes Daughter   . Heart disease Mother   . Diabetes Brother   . Other Sister        scarlet fever  . Rheumatic fever Sister     . Colon cancer Neg Hx    Scheduled Meds: . apixaban  5 mg Oral BID  . guaiFENesin  600 mg Oral BID  . ipratropium-albuterol  3 mL Nebulization TID  . methylPREDNISolone (SOLU-MEDROL) injection  40 mg Intravenous Q8H  . senna-docusate  1 tablet Oral BID  . sodium chloride flush  3  mL Intravenous Once  . sodium chloride flush  3 mL Intravenous Q12H   Continuous Infusions: . sodium chloride     PRN Meds:.sodium chloride, acetaminophen, clonazepam, ondansetron (ZOFRAN) IV, sodium chloride flush Medications Prior to Admission:  Prior to Admission medications   Medication Sig Start Date End Date Taking? Authorizing Provider  acetaminophen (TYLENOL) 325 MG tablet Take 650 mg by mouth every 4 (four) hours as needed (for pain or elevated temperature).   Yes [provider]  albuterol (PROVENTIL HFA;VENTOLIN HFA) 108 (90 BASE) MCG/ACT inhaler Inhale 2 puffs into the lungs every 4 (four) hours as needed for shortness of breath.    Yes [provider]  Aromatic Inhalants (VICKS BABYRUB) OINT Apply 1 application topically every 8 (eight) hours as needed (for cough and/or congestion).   Yes [provider]  bisacodyl (DULCOLAX) 10 MG suppository Place 10 mg rectally daily as needed (for constipation).   Yes [provider]  bumetanide (BUMEX) 2 MG tablet Take 2 mg by mouth See admin instructions. Take 2 mg by mouth three times a day (30 minutes after Metolazone)   Yes [provider]  Carboxymethylcell-Glycerin PF (REFRESH OPTIVE PF) 0.5-0.9 % SOLN Place 1 drop into both eyes every 8 (eight) hours as needed (for dry eyes).   Yes [provider]  clonazePAM (KLONOPIN) 0.5 MG tablet Take 0.25 mg by mouth 3 (three) times daily.   Yes [provider]  Dextran 70-Hypromellose (ARTIFICIAL TEARS PF OP) Place 1 drop into both eyes every 8 (eight) hours as needed (for dry eyes).   Yes [provider]  ELIQUIS 5 MG TABS tablet TAKE 1 TABLET  (5 MG TOTAL) BY MOUTH 2 (TWO) TIMES DAILY. Patient taking differently: Take 5 mg by mouth 2 (two) times daily.  10/23/17  Yes Dorothy Spark, MD  ferrous sulfate 325 (65 FE) MG tablet Take 325 mg by mouth daily with breakfast.   Yes [provider]  guaiFENesin (MUCINEX) 600 MG 12 hr tablet Take 600 mg by mouth 2 (two) times daily.   Yes [provider]  guaifenesin (ROBITUSSIN) 100 MG/5ML syrup Take 200 mg by mouth every 4 (four) hours as needed for cough.   Yes [provider]  hydrALAZINE (APRESOLINE) 10 MG tablet Take 20 mg by mouth See admin instructions. Take 20 mg (2 tablets) by mouth two times a day and hold for a a B/P <110/60   Yes [provider]  ipratropium-albuterol (DUONEB) 0.5-2.5 (3) MG/3ML SOLN Take 3 mLs by nebulization every 6 (six) hours.   Yes [provider]  levothyroxine (SYNTHROID) 175 MCG tablet Take 175 mcg by mouth daily before breakfast.   Yes [provider]  magnesium hydroxide (MILK OF MAGNESIA) 400 MG/5ML suspension Take 30 mLs by mouth daily as needed for mild constipation.   Yes [provider]  memantine (NAMENDA) 10 MG tablet Take 10 mg by mouth 2 (two) times daily.   Yes [provider]  metolazone (ZAROXOLYN) 5 MG tablet Take 5 mg by mouth 2 (two) times daily.   Yes [provider]  metoprolol succinate (TOPROL-XL) 50 MG 24 hr tablet Take 50 mg by mouth daily. Take with or immediately following a meal.   Yes [provider]  omeprazole (PRILOSEC) 20 MG capsule Take 20 mg by mouth in the morning.   Yes [provider]  ondansetron (ZOFRAN) 4 MG tablet Take 4 mg by mouth every 8 (eight) hours as needed for nausea or  vomiting.   Yes [provider]  polyethylene glycol powder (GLYCOLAX/MIRALAX) 17 GM/SCOOP powder Take 17 g by mouth See admin instructions. Mix 17 grams into 4-6 ounces of liquid and drink once a day   Yes [provider]  potassium  chloride SA (KLOR-CON) 20 MEQ tablet Take 40 mEq by mouth 2 (two) times daily.   Yes [provider]  rOPINIRole (REQUIP) 3 MG tablet Take 3 mg by mouth at bedtime.   Yes [provider]  Skin Protectants, Misc. (EUCERIN) cream Apply 1 application topically See admin instructions. Apply to both legs 3 times a day   Yes [provider]  losartan (COZAAR) 50 MG tablet Take 1 tablet (50 mg total) by mouth daily. Patient not taking: Reported on 05/06/2019 01/19/17 05/06/19  Thompson Grayer, MD  spironolactone (ALDACTONE) 25 MG tablet Take 0.5 tablets (12.5 mg total) by mouth daily. Patient not taking: Reported on 05/06/2019 02/27/17 05/06/19  Dorothy Spark, MD   Allergies  Allergen Reactions  . Advair Diskus [Fluticasone-Salmeterol] Other (See Comments)    Severe muscle pain, Couldn't walk at all  . Aspirin Shortness Of Breath, Nausea Only, Swelling and Other (See Comments)    ONLY IN HIGH DOSES- stomach pain, also  . Dulera [Mometasone Furo-Formoterol Fum] Other (See Comments)    Caused A-FIB  . Ivp Dye [Iodinated Diagnostic Agents]     Pt states she thinks she had BP drop-unsure---Pre Treatment pt states she does fine  . Losartan Potassium Shortness Of Breath and Swelling  . Propofol Other (See Comments)    Severely prolonged altered mental status and increased dementia   . Amitriptyline Other (See Comments)    Couldn't sleep  . Budesonide-Formoterol Fumarate Other (See Comments)    Leg cramps and Visual deficit  . Codeine Nausea And Vomiting, Palpitations and Other (See Comments)    Causes arrhythmia(s) and "goes crazy"  . Ketorolac Tromethamine Swelling and Other (See Comments)    Angioedema   . Oxycodone-Acetaminophen Other (See Comments)    Confusion  . Sulfasalazine Other (See Comments)    Unknown reaction  . Acetaminophen Other (See Comments)    Reaction ??  . Alprazolam Other (See Comments)    "Allergic," per MAR  . Amitriptyline Hcl Other (See  Comments)    "Could not sleep"- "allergic," per MAR  . Amlodipine Besylate Swelling  . Azithromycin Other (See Comments)    Irreg pulse  . Fluticasone Furoate-Vilanterol Other (See Comments)    Severe eye issues and saw flashing lights  . Iodine Other (See Comments)    "Allergic," per MAR, possible hypotenison  . Morphine And Related Hives and Other (See Comments)    Caused arrhythmias and "goes crazy"  . Other Hives and Other (See Comments)    Opioids - Morphine Analogues- Narcotics - reverse effect on her, couldn't sleep, arrhythmias  . Oxycodone Hcl Other (See Comments)    Confusion   . Prevacid [Lansoprazole] Other (See Comments)    Unknown   . Tramadol Hives  . Morphine Palpitations and Other (See Comments)    Tachycardia   Review of Systems  Constitutional: Positive for activity change.  Respiratory: Positive for shortness of breath.   Psychiatric/Behavioral: The patient is nervous/anxious.     Physical Exam Vitals reviewed.  Constitutional:      General: She is not in acute distress. HENT:     Head: Normocephalic and atraumatic.  Pulmonary:     Comments: Respiratory effort mildly labored. Pursed lip breathing noted  even at rest.  Skin:    General: Skin is warm and dry.  Neurological:     Mental Status: She is alert.     Vital Signs: BP 137/79 (BP Location: Left Arm)   Pulse 86   Temp 98.1 F (36.7 C) (Oral)   Resp 18   Ht '5\' 4"'$  (1.626 m)   Wt 90.8 kg   SpO2 97%   BMI 34.36 kg/m  Pain Scale: 0-10   Pain Score: 0-No pain   SpO2: SpO2: 97 % O2 Device:SpO2: 97 % O2 Flow Rate: .O2 Flow Rate (L/min): 2 L/min  IO: Intake/output summary:   Intake/Output Summary (Last 24 hours) at 05/10/2019 0932 Last data filed at 05/10/2019 0050 Gross per 24 hour  Intake 720 ml  Output 500 ml  Net 220 ml    LBM: Last BM Date: 05/04/19 Baseline Weight: Weight: 80 kg Most recent weight: Weight: 90.8 kg      Palliative Assessment/Data: 40%     Time In:  1030 Time Out: 1140 Time Total: 70 minutes Greater than 50%  of this time was spent counseling and coordinating care related to the above assessment and plan.  Signed by: Lavena Bullion, NP   Please contact Palliative Medicine Team phone at [phone] for questions and concerns.  For individual provider: See Malachi Paradise, NP Palliative Medicine Team Pager [phone] (Please see [email or web address] for schedule) Team Phone [phone]

## 2019-05-10 NOTE — Discharge Summary (Signed)
Discharge Summary  Kari [name] ZOX:[id number] DOB: 1929/01/10  PCP: Earlie Server, MD  Admit date: 05/05/2019 Discharge date: 05/10/2019  Time spent: , more than 50% time spent on coordination of care.  Recommendations for Outpatient Follow-up:  1. F/u with SNF MD for hospital discharge follow up, repeat cbc/bmp at follow up 2. F/u with pulmonology  3. F/u with cardiology 4. Palliative care/hospice at SNF  Discharge Diagnoses:  Active Hospital Problems   Diagnosis Date Noted  . Acute exacerbation of CHF (congestive heart failure) (HCC) 05/06/2019  . Essential hypertension 01/28/2015  . Pulmonary hypertension (HCC) 08/26/2014  . Atrial fibrillation (HCC) 07/23/2014  . GERD (gastroesophageal reflux disease) 10/07/2011  . Asthma with COPD (HCC) 10/07/2011  . Sarcoidosis 10/07/2011  . MITRAL VALVE INSUFF&AORTIC VALVE INSUFF 01/26/2009    Resolved Hospital Problems  No resolved problems to display.    Discharge Condition: stable  Diet recommendation: heart healthy, 1500cc fluid restriction per day  Filed Weights   05/08/19 0601 05/09/19 0517 05/10/19 0431  Weight: 90.9 kg 90.7 kg 90.8 kg    History of present illness: ( per admitting MD Dr Mikeal Hawthorne) PCP: Earlie Server, MD   Outpatient Specialists: None  Patient coming from: [facility], skilled nursing facility  Chief Complaint: Worsening lower extremity edema shortness of breath  HPI: Kari [name] is a 86 y.o. female with medical history significant of chronic diastolic CHF, COPD, chronic kidney disease stage III, hypothyroidism, valvular heart disease, GERD, sarcoidosis who presents from skilled nursing facility with worsening lower extremity edema as well as shortness of breath.  She has had chronic lymphedema which has gotten worse.  Also worsening renal function as well as exertional dyspnea.  She has had a history of the CHF with the last echo on file from 2019.  At that time her EF is 60 to  65%.  Also now has pulmonary hypertension.  Patient is here now with markedly swollen lower extremity.  Her creatinine is above 2 the last creatinine here in 2018 was normal.  She has documented chronic kidney disease stage III but most recent creatinine is unknown.  Patient has had no melena.  No nausea vomiting or diarrhea.  She has some orthopnea.  She is morbidly obese.  At this point she appears to have fluid overload and is being admitted with acute exacerbation of CHF.Marland Kitchen  ED Course: Temperature is 98 blood pressure 143/102 pulse 87 respirate 29 oxygen sat 98% room air.  White count 8.0 hemoglobin 10.5 and platelets 288.  Sodium is 135 potassium 3.1 chloride 86 CO2 35 BUN 42 creatinine 2.42 and calcium 8.3.  BNP is 363.  Hemoglobin is 10.5 otherwise CBC is within normal.  Troponin is 26.  Magnesium 1.8.  PT 15.7 INR 1.3.  Chest x-ray showed cardiomegaly with mild pulmonary vascular congestion with no frank edema.  Patient being admitted to the hospital for evaluation.   Hospital Course:  Principal Problem:   Acute exacerbation of CHF (congestive heart failure) (HCC) Active Problems:   MITRAL VALVE INSUFF&AORTIC VALVE INSUFF   GERD (gastroesophageal reflux disease)   Asthma with COPD (HCC)   Sarcoidosis   Atrial fibrillation (HCC)   Pulmonary hypertension (HCC)   Essential hypertension   Acute on chronic hypoxic respiratory failure, reports on home o2 2liters for the last year -Likely combination of acute on chronic diastolic CHF exacerbation and COPD exacerbation  -She received IV Lasix  x1 ib on April 11, bid on 12 , x1 on 4/13, x1 on 4/14 -initial  cxr "Cardiomegaly with mild pulmonary vascular congestion. No evidence of frank edema" -repeat cxr on 4/14 , no significant sign of vascular congestion,  -cardiology consulted, recommend discharge on Lasix 40 mg daily and K-Dur 20 mEq daily.  Will need close follow-up of potassium and renal function following discharge.   COPD  exacerbation H/o sarcoidosis cxr no mention of  lung hilar and mediastinal nodal changes or lung parenchymal changes -treated with  steroids, nebs -betablocker discontinued by cardiology due to concerning copd -Appreciate pulmonology consult, taper off steroids, follow up with pulmonology    PAF  Currently in afib, rate controlled, on eliquis, toprol discontinued by cardiology due to concerning for copd Cardiology recommend cardizem if needed for rate control   AKI on CKDIIIb, anemia of chronic disease Bun/cr on presentation is 42/2.42 Bun/cr at discharge is 48/1.2 hgb stable above 10 Renal dosing meds, avoid renal toxin  Hypokalemia: replaced and normalized  History of large hiatal hernia has been sleeping in recliner in the last few years.  Dementia/memory impairment   propofol induced Per family She is on namenda   Body mass index is 34.31 kg/m.   Generalized deconditioning/FTT -PT eval, patient got exhausted after standing up for 5mins -per daughter patient has progressive short of breath over the last 4867-month, daughter wants pulmonary  input and also agreed to talk to palliative care -Pulmonology and palliative care consult appreciated, patient can return to skilled nursing facility with palliative care hospice service  DVT Prophylaxis: eliquis  Code Status: DNR  Family Communication: daughter who is a home health RN over the phone  Disposition Plan:    [facility]  Anticipated d/c place: return to long term care with palliative care hospice service    Consultants:  Cardiology  Pulmonology  Palliative care   Procedures:  none  Antibiotics:  none   Discharge Exam: BP 137/79 (BP Location: Left Arm)   Pulse 86   Temp 98.1 F (36.7 C) (Oral)   Resp 18    Ht 5\' 4"  (1.626 m)   Wt 90.8 kg   SpO2 97%   BMI 34.36 kg/m    General:  pursed lip breathing  Cardiovascular: IRRR  Respiratory: overall very diminished , no significant wheezing, left lower lobe rales  Abdomen: Soft/ND/NT, positive BS  Musculoskeletal: No Edema  Neuro: alert, slightly confused about the month, but  oriented to year, place and person    Discharge Instructions You were cared for by a hospitalist during your hospital stay. If you have any questions about your discharge medications or the care you received while you were in the hospital after you are discharged, you can call the unit and asked to speak with the hospitalist on call if the hospitalist that took care of you is not available. Once you are discharged, your primary care physician will handle any further medical issues. Please note that NO REFILLS for any discharge medications will be authorized once you are discharged, as it is imperative that you return to your primary care physician (or establish a relationship with a primary care physician if you do not have one) for your aftercare needs so that they can reassess your need for medications and monitor your lab values.  Discharge Instructions    Diet - low sodium heart healthy   Complete by: As directed    Increase activity slowly   Complete by: As directed      Allergies  as of 05/10/2019      Reactions   Advair Diskus [fluticasone-salmeterol] Other (See Comments)   Severe muscle pain, Couldn't walk at all   Aspirin Shortness Of Breath, Nausea Only, Swelling, Other (See Comments)   ONLY IN HIGH DOSES- stomach pain, also   Dulera [mometasone Furo-formoterol Fum] Other (See Comments)   Caused A-FIB   Ivp Dye [iodinated Diagnostic Agents]    Pt states she thinks she had BP drop-unsure---Pre Treatment pt states she does fine   Losartan Potassium Shortness Of Breath, Swelling   Propofol Other (See Comments)   Severely prolonged altered mental status and  increased dementia   Amitriptyline Other (See Comments)   Couldn't sleep   Budesonide-formoterol Fumarate Other (See Comments)   Leg cramps and Visual deficit   Codeine Nausea And Vomiting, Palpitations, Other (See Comments)   Causes arrhythmia(s) and "goes crazy"   Ketorolac Tromethamine Swelling, Other (See Comments)   Angioedema    Oxycodone-acetaminophen Other (See Comments)   Confusion   Sulfasalazine Other (See Comments)   Unknown reaction   Acetaminophen Other (See Comments)   Reaction ??   Alprazolam Other (See Comments)   "Allergic," per MAR   Amitriptyline Hcl Other (See Comments)   "Could not sleep"- "allergic," per MAR   Amlodipine Besylate Swelling   Azithromycin Other (See Comments)   Irreg pulse   Fluticasone Furoate-vilanterol Other (See Comments)   Severe eye issues and saw flashing lights   Iodine Other (See Comments)   "Allergic," per MAR, possible hypotenison   Morphine And Related Hives, Other (See Comments)   Caused arrhythmias and "goes crazy"   Other Hives, Other (See Comments)   Opioids - Morphine Analogues- Narcotics - reverse effect on her, couldn't sleep, arrhythmias   Oxycodone Hcl Other (See Comments)   Confusion   Prevacid [lansoprazole] Other (See Comments)   Unknown   Tramadol Hives   Morphine Palpitations, Other (See Comments)   Tachycardia      Medication List    STOP taking these medications   bumetanide 2 MG tablet Commonly known as: BUMEX   hydrALAZINE 10 MG tablet Commonly known as: APRESOLINE   losartan 50 MG tablet Commonly known as: COZAAR   metolazone 5 MG tablet Commonly known as: ZAROXOLYN   metoprolol succinate 50 MG 24 hr tablet Commonly known as: TOPROL-XL   Milk of Magnesia 400 MG/5ML suspension Generic drug: magnesium hydroxide   spironolactone 25 MG tablet Commonly known as: ALDACTONE     TAKE these medications   acetaminophen 325 MG tablet Commonly known as: TYLENOL Take 650 mg by mouth every 4  (four) hours as needed (for pain or elevated temperature).   albuterol 108 (90 Base) MCG/ACT inhaler Commonly known as: VENTOLIN HFA Inhale 2 puffs into the lungs every 4 (four) hours as needed for shortness of breath.   ARTIFICIAL TEARS PF OP Place 1 drop into both eyes every 8 (eight) hours as needed (for dry eyes).   bisacodyl 10 MG suppository Commonly known as: DULCOLAX Place 10 mg rectally daily as needed (for constipation).   clonazePAM 0.5 MG tablet Commonly known as: KLONOPIN Take 0.25 mg by mouth 3 (three) times daily.   diltiazem 30 MG tablet Commonly known as: Cardizem Take 1 tablet (30 mg total) by mouth 2 (two) times daily. Hold if sbp less than 100 or heart rate less than 60   Eliquis 5 MG Tabs tablet Generic drug: apixaban TAKE 1 TABLET (5 MG TOTAL) BY MOUTH 2 (TWO) TIMES DAILY.  What changed: See the new instructions.   eucerin cream Apply 1 application topically See admin instructions. Apply to both legs 3 times a day   ferrous sulfate 325 (65 FE) MG tablet Take 1 tablet (325 mg total) by mouth every Monday, Wednesday, and Friday. With breakfast What changed:   when to take this  additional instructions   furosemide 40 MG tablet Commonly known as: LASIX Take 1 tablet (40 mg total) by mouth daily.   guaifenesin 100 MG/5ML syrup Commonly known as: ROBITUSSIN Take 200 mg by mouth every 4 (four) hours as needed for cough.   guaiFENesin 600 MG 12 hr tablet Commonly known as: MUCINEX Take 600 mg by mouth 2 (two) times daily.   ipratropium-albuterol 0.5-2.5 (3) MG/3ML Soln Commonly known as: DUONEB Take 3 mLs by nebulization every 6 (six) hours.   levothyroxine 175 MCG tablet Commonly known as: SYNTHROID Take 175 mcg by mouth daily before breakfast.   memantine 10 MG tablet Commonly known as: NAMENDA Take 10 mg by mouth 2 (two) times daily.   omeprazole 20 MG capsule Commonly known as: PRILOSEC Take 20 mg by mouth in the morning.     ondansetron 4 MG tablet Commonly known as: ZOFRAN Take 4 mg by mouth every 8 (eight) hours as needed for nausea or vomiting.   polyethylene glycol powder 17 GM/SCOOP powder Commonly known as: GLYCOLAX/MIRALAX Take 17 g by mouth See admin instructions. Mix 17 grams into 4-6 ounces of liquid and drink once a day   potassium chloride SA 20 MEQ tablet Commonly known as: KLOR-CON Take 1 tablet (20 mEq total) by mouth daily. What changed:   how much to take  when to take this   Refresh Optive PF 0.5-0.9 % Soln Generic drug: Carboxymethylcell-Glycerin PF Place 1 drop into both eyes every 8 (eight) hours as needed (for dry eyes).   rOPINIRole 3 MG tablet Commonly known as: REQUIP Take 3 mg by mouth at bedtime.   senna-docusate 8.6-50 MG tablet Commonly known as: Senokot-S Take 1 tablet by mouth 2 (two) times daily.   umeclidinium bromide 62.5 MCG/INH Aepb Commonly known as: INCRUSE ELLIPTA Inhale 1 puff into the lungs daily. Please contact pulmonology Dr Rodman Pickle if prior authorization or refill needed   Vicks BabyRub Oint Apply 1 application topically every 8 (eight) hours as needed (for cough and/or congestion).      Allergies  Allergen Reactions  . Advair Diskus [Fluticasone-Salmeterol] Other (See Comments)    Severe muscle pain, Couldn't walk at all  . Aspirin Shortness Of Breath, Nausea Only, Swelling and Other (See Comments)    ONLY IN HIGH DOSES- stomach pain, also  . Dulera [Mometasone Furo-Formoterol Fum] Other (See Comments)    Caused A-FIB  . Ivp Dye [Iodinated Diagnostic Agents]     Pt states she thinks she had BP drop-unsure---Pre Treatment pt states she does fine  . Losartan Potassium Shortness Of Breath and Swelling  . Propofol Other (See Comments)    Severely prolonged altered mental status and increased dementia   . Amitriptyline Other (See Comments)    Couldn't sleep  . Budesonide-Formoterol Fumarate Other (See Comments)    Leg cramps and Visual  deficit  . Codeine Nausea And Vomiting, Palpitations and Other (See Comments)    Causes arrhythmia(s) and "goes crazy"  . Ketorolac Tromethamine Swelling and Other (See Comments)    Angioedema   . Oxycodone-Acetaminophen Other (See Comments)    Confusion  . Sulfasalazine Other (See Comments)  Unknown reaction  . Acetaminophen Other (See Comments)    Reaction ??  . Alprazolam Other (See Comments)    "Allergic," per MAR  . Amitriptyline Hcl Other (See Comments)    "Could not sleep"- "allergic," per MAR  . Amlodipine Besylate Swelling  . Azithromycin Other (See Comments)    Irreg pulse  . Fluticasone Furoate-Vilanterol Other (See Comments)    Severe eye issues and saw flashing lights  . Iodine Other (See Comments)    "Allergic," per MAR, possible hypotenison  . Morphine And Related Hives and Other (See Comments)    Caused arrhythmias and "goes crazy"  . Other Hives and Other (See Comments)    Opioids - Morphine Analogues- Narcotics - reverse effect on her, couldn't sleep, arrhythmias  . Oxycodone Hcl Other (See Comments)    Confusion   . Prevacid [Lansoprazole] Other (See Comments)    Unknown   . Tramadol Hives  . Morphine Palpitations and Other (See Comments)    Tachycardia   Follow-up Information    Earlie Server, MD Follow up.   Specialty: [facility] Contact information: [address] [place] [address] [phone]        Lars Masson, MD Follow up.   Specialty: Cardiology Why: aortic stenosis/aortic insufficiency heart failrue management Contact information: [address] [place] [address] [phone]        [name], MD Follow up.   Specialty: Cardiology Why: afib Contact information: [address] [place] [address] [phone]        Luciano Cutter, MD Follow up.   Specialty: Pulmonary Disease Why: copd/asthma/sarcoidosis follow up Contact information: [address] [place] [address] [phone]            The results of significant diagnostics from this hospitalization (including imaging, microbiology, ancillary and laboratory) are listed below for reference.    Significant Diagnostic Studies: DG Chest 2 View  Result Date: 05/05/2019 CLINICAL DATA:  Edema complains of leg swelling EXAM: CHEST - 2 VIEW COMPARISON:  01/08/2016 FINDINGS: Cardiac loop recorder projects over left heart. Heart size remains enlarged. Stable when compared to the previous exam. Signs of hiatal hernia as before. Lungs with mild pulmonary vascular congestion, no consolidation or evidence of frank edema. No pleural fluid. Signs of prior trauma to the right humerus and right chest wall. IMPRESSION: Cardiomegaly with mild pulmonary vascular congestion. No evidence of frank edema. Electronically Signed   By: Donzetta Kohut M.D.   On: 05/05/2019 19:35   DG CHEST PORT 1 VIEW  Result Date: 05/08/2019 CLINICAL DATA:  Follow-up evaluation for shortness of breath EXAM: PORTABLE CHEST 1 VIEW COMPARISON:  05/05/2019 FINDINGS: Cardiomegaly. Both lungs are clear. The visualized skeletal structures are unremarkable. IMPRESSION: Cardiomegaly without acute abnormality of the lungs in AP portable projection. Electronically Signed   By: Lauralyn Primes M.D.   On: 05/08/2019 11:19   ECHOCARDIOGRAM COMPLETE  Result Date: 05/06/2019    ECHOCARDIOGRAM REPORT   Patient Name:   TEAGYN FISHEL Date of Exam: 05/06/2019 Medical Rec #:  [id number]      Height:       64.0 in Accession #:    [id number]     Weight:       176.4 lb Date of Birth:  Jun 12, 1928      BSA:          1.855 m Patient Age:    24 years  BP:           116/62 mmHg Patient Gender: F              HR:           86 bpm. Exam Location:  Inpatient Procedure: 2D Echo, Cardiac Doppler and Color Doppler Indications:    I50.33 Acute on chronic diastolic (congestive) heart failure  History:        Patient has prior history of Echocardiogram  examinations, most                 recent 10/26/2016. COPD; Arrythmias:Atrial Fibrillation.                 Sarcoidosis. GERD. Thyroid Disease. Dilated Aortic Root.  Sonographer:    Elmarie Shiley Dance Referring Phys: [id number] MOHAMMAD L GARBA IMPRESSIONS  1. Left ventricular ejection fraction, by estimation, is 55 to 60%. The left ventricle has normal function. The left ventricle has no regional wall motion abnormalities. There is mild left ventricular hypertrophy. Left ventricular diastolic parameters are indeterminate.  2. Right ventricular systolic function is normal. The right ventricular size is normal. There is moderately elevated pulmonary artery systolic pressure.  3. Left atrial size was moderately dilated.  4. Right atrial size was moderately dilated.  5. The mitral valve is myxomatous. Mild mitral valve regurgitation. No evidence of mitral stenosis.  6. Mean gradient consistent with aortic valve sclerosis. However, leaflet movement is restricted and there appears to be at least mild aortic stenosis. The left and right coronary cusps are nearly fixed. The aortic valve is abnormal. Aortic valve regurgitation is moderate. Mild to moderate aortic valve sclerosis/calcification is present, without any evidence of aortic stenosis. Aortic regurgitation PHT measures 319 msec.  7. Aortic dilatation noted. There is mild dilatation of the ascending aorta measuring 40 mm.  8. The inferior vena cava is normal in size with <50% respiratory variability, suggesting right atrial pressure of 8 mmHg. FINDINGS  Left Ventricle: Left ventricular ejection fraction, by estimation, is 55 to 60%. The left ventricle has normal function. The left ventricle has no regional wall motion abnormalities. The left ventricular internal cavity size was normal in size. There is  mild left ventricular hypertrophy. Left ventricular diastolic parameters are indeterminate. Right Ventricle: The right ventricular size is normal. No increase in right  ventricular wall thickness. Right ventricular systolic function is normal. There is moderately elevated pulmonary artery systolic pressure. The tricuspid regurgitant velocity is 3.12 m/s, and with an assumed right atrial pressure of 8 mmHg, the estimated right ventricular systolic pressure is 46.9 mmHg. Left Atrium: Left atrial size was moderately dilated. Right Atrium: Right atrial size was moderately dilated. Pericardium: There is no evidence of pericardial effusion. Mitral Valve: The mitral valve is myxomatous. Normal mobility of the mitral valve leaflets. Mild mitral annular calcification. Mild mitral valve regurgitation. No evidence of mitral valve stenosis. Tricuspid Valve: The tricuspid valve is normal in structure. Tricuspid valve regurgitation is mild . No evidence of tricuspid stenosis. Aortic Valve: Mean gradient consistent with aortic valve sclerosis. However, leaflet movement is restricted and there appears to be at least mild aortic stenosis. The left and right coronary cusps are nearly fixed. The aortic valve is abnormal. . There is moderate thickening and moderate calcification of the aortic valve. Aortic valve regurgitation is moderate. Aortic regurgitation PHT measures 319 msec. Mild to moderate aortic valve sclerosis/calcification is present, without any evidence of aortic stenosis. There is moderate thickening of the aortic valve. There is  moderate calcification of the aortic valve. Aortic valve mean gradient measures 6.0 mmHg. Aortic valve peak gradient measures 11.4 mmHg. Aortic valve area, by VTI measures 2.21 cm. Pulmonic Valve: The pulmonic valve was normal in structure. Pulmonic valve regurgitation is mild. No evidence of pulmonic stenosis. Aorta: The aortic root is normal in size and structure and aortic dilatation noted. There is mild dilatation of the ascending aorta measuring 40 mm. Venous: The inferior vena cava is normal in size with less than 50% respiratory variability, suggesting  right atrial pressure of 8 mmHg. IAS/Shunts: No atrial level shunt detected by color flow Doppler.  LEFT VENTRICLE PLAX 2D LVIDd:         4.20 cm LVIDs:         2.70 cm LV PW:         1.27 cm LV IVS:        0.90 cm LVOT diam:     2.30 cm LV SV:         87 LV SV Index:   47 LVOT Area:     4.15 cm  RIGHT VENTRICLE             IVC RV Basal diam:  2.80 cm     IVC diam: 3.30 cm RV S prime:     10.40 cm/s TAPSE (M-mode): 1.8 cm LEFT ATRIUM             Index       RIGHT ATRIUM           Index LA diam:        4.20 cm 2.26 cm/m  RA Area:     22.90 cm LA Vol (A2C):   76.5 ml 41.25 ml/m RA Volume:   67.40 ml  36.34 ml/m LA Vol (A4C):   51.1 ml 27.55 ml/m LA Biplane Vol: 62.9 ml 33.92 ml/m  AORTIC VALVE AV Area (Vmax):    2.15 cm AV Area (Vmean):   2.16 cm AV Area (VTI):     2.21 cm AV Vmax:           169.00 cm/s AV Vmean:          119.000 cm/s AV VTI:            0.396 m AV Peak Grad:      11.4 mmHg AV Mean Grad:      6.0 mmHg LVOT Vmax:         87.55 cm/s LVOT Vmean:        61.800 cm/s LVOT VTI:          0.211 m LVOT/AV VTI ratio: 0.53 AI PHT:            319 msec  AORTA Ao Root diam: 3.60 cm Ao Asc diam:  3.90 cm MITRAL VALVE                TRICUSPID VALVE MV Area (PHT): 4.26 cm     TR Peak grad:   38.9 mmHg MV Decel Time: 178 msec     TR Vmax:        312.00 cm/s MV E velocity: 158.50 cm/s                             SHUNTS                             Systemic VTI:  0.21 m  Systemic Diam: 2.30 cm [name] MD Electronically signed by [name] MD Signature Date/Time: 05/06/2019/6:32:31 PM    Final     Microbiology: Recent Results (from the past 240 hour(s))  SARS CORONAVIRUS 2 (TAT 6-24 HRS) Nasopharyngeal Nasopharyngeal Swab     Status: None   Collection Time: 05/06/19 12:40 AM   Specimen: Nasopharyngeal Swab  Result Value Ref Range Status   SARS Coronavirus 2 NEGATIVE NEGATIVE Final    Comment: (NOTE) SARS-CoV-2 target nucleic acids are NOT DETECTED. The  SARS-CoV-2 RNA is generally detectable in upper and lower respiratory specimens during the acute phase of infection. Negative results do not preclude SARS-CoV-2 infection, do not rule out co-infections with other pathogens, and should not be used as the sole basis for treatment or other patient management decisions. Negative results must be combined with clinical observations, patient history, and epidemiological information. The expected result is Negative. Fact Sheet for Patients: HairSlick.no Fact Sheet for Healthcare Providers: quierodirigir.com This test is not yet approved or cleared by the [place] FDA and  has been authorized for detection and/or diagnosis of SARS-CoV-2 by FDA under an Emergency Use Authorization (EUA). This EUA will remain  in effect (meaning this test can be used) for the duration of the COVID-19 declaration under Section 56 4(b)(1) of the Act, 21 U.S.C. section 360bbb-3(b)(1), unless the authorization is terminated or revoked sooner. Performed at [facility], [address] [place], [place] [address]      Labs: Basic Metabolic Panel: Recent Labs  Lab 05/05/19 1920 05/05/19 2211 05/06/19 0959 05/07/19 0455 05/08/19 0459 05/09/19 0522 05/10/19 0448  NA   < >  --  143 141 141 141 142  K   < >  --  3.3* 3.8 3.5 3.6 4.4  CL   < >  --  89* 86* 86* 86* 89*  CO2   < >  --  42* 42* 41* 42* 38*  GLUCOSE   < >  --  103* 172* 148* 151* 142*  BUN   < >  --  42* 49* 58* 56* 48*  CREATININE   < >  --  1.94* 1.53* 1.50* 1.42* 1.20*  CALCIUM   < >  --  8.3* 8.7* 9.1 9.1 9.6  MG  --  1.8 1.9  --  2.2 2.2  --    < > = values in this interval not displayed.   Liver Function Tests: Recent Labs  Lab 05/06/19 0959  AST 15  ALT 11  ALKPHOS 64  BILITOT 0.9  PROT 6.6  ALBUMIN 3.6   No results for input(s): LIPASE, AMYLASE in the last 168 hours. No results for input(s): AMMONIA in the last 168  hours. CBC: Recent Labs  Lab 05/05/19 1920 05/06/19 0959 05/07/19 0455 05/08/19 0459  WBC 8.0 7.9 7.0 7.6  NEUTROABS  --  6.1  --  7.0  HGB 10.5* 9.6* 10.0* 10.2*  HCT 34.2* 31.4* 32.6* 32.5*  MCV 97.7 98.7 97.0 95.6  PLT 288 251 260 271   Cardiac Enzymes: No results for input(s): CKTOTAL, CKMB, CKMBINDEX, TROPONINI in the last 168 hours. BNP: BNP (last 3 results) Recent Labs    05/05/19 2211  BNP 363.0*    ProBNP (last 3 results) No results for input(s): PROBNP in the last 8760 hours.  CBG: No results for input(s): GLUCAP in the last 168 hours.     Signed:  Albertine Grates MD, PhD, FACP  [facility] 05/10/2019, 12:13 PM

## 2019-05-10 NOTE — Progress Notes (Signed)
Physical Therapy Treatment Patient Details Name: Kari [name] MRN: [id number] DOB: [birth year] Today's Date: 05/10/2019    History of Present Illness 86 year old female with history of chronic diastolic CHF, COPD, chronic kidney disease stage III, hypothyroidism, valvular heart disease, GERD, sarcoidosis presented from skilled nursing facility with worsening lower extremity edema and shortness of breath.     PT Comments    Pt demonstrating gradual improvement in transfers but continues to need cues for safety.  Treatment was limited due to pt d/cing and transportation present for pt to leave hospital.     Follow Up Recommendations  SNF     Equipment Recommendations  None recommended by PT    Recommendations for Other Services       Precautions / Restrictions Precautions Precautions: Fall    Mobility  Bed Mobility   Bed Mobility: Supine to Sit;Sit to Supine     Supine to sit: Min guard;HOB elevated Sit to supine: Min guard;HOB elevated      Transfers Overall transfer level: Needs assistance Equipment used: Rolling walker (2 wheeled) Transfers: Sit to/from [employer] Sit to Stand: Min guard Stand pivot transfers: Min assist       General transfer comment: min assist for steadying with stand pivot; performed sit to stand x 3 with cues for safe hand placement  Ambulation/Gait Ambulation/Gait assistance: Min assist Gait Distance (Feet): 4 Feet Assistive device: Rolling walker (2 wheeled) Gait Pattern/deviations: Step-to pattern;Decreased stride length;Shuffle;Wide base of support Gait velocity: decreased   General Gait Details: PTAR into pick up pt.  Assisted pt with 4' of ambulation to stretcher with cues for safety and posture   Stairs             Wheelchair Mobility    Modified Rankin (Stroke Patients Only)       Balance                                            Cognition Arousal/Alertness:  Awake/alert Behavior During Therapy: WFL for tasks assessed/performed Overall Cognitive Status: Within Functional Limits for tasks assessed                                 General Comments: Pt initially declining PT but then agreeable as she had to get up to have BM      Exercises      General Comments General comments (skin integrity, edema, etc.): VSS on 2 L O2      Pertinent Vitals/Pain Pain Assessment: No/denies pain    Home Living                      Prior Function            PT Goals (current goals can now be found in the care plan section) Acute Rehab PT Goals Patient Stated Goal: to go home PT Goal Formulation: With patient Time For Goal Achievement: 05/21/19 Potential to Achieve Goals: Good Progress towards PT goals: Progressing toward goals    Frequency    Min 3X/week      PT Plan Current plan remains appropriate    Co-evaluation              AM-PAC PT "6 Clicks" Mobility   Outcome Measure  Help needed turning from your back to your side  while in a flat bed without using bedrails?: None Help needed moving from lying on your back to sitting on the side of a flat bed without using bedrails?: None Help needed moving to and from a bed to a chair (including a wheelchair)?: A Little Help needed standing up from a chair using your arms (e.g., wheelchair or bedside chair)?: A Little Help needed to walk in hospital room?: A Little Help needed climbing 3-5 steps with a railing? : A Lot 6 Click Score: 19    End of Session Equipment Utilized During Treatment: Gait belt;Oxygen Activity Tolerance: Patient tolerated treatment well Patient left: Other (comment)(with PTAR)   PT Visit Diagnosis: Unsteadiness on feet (R26.81);History of falling (Z91.81);Muscle weakness (generalized) (M62.81)     Time: 1696-7893 PT Time Calculation (min) (ACUTE ONLY): 27 min  Charges:  $Therapeutic Activity: 8-22 mins(only 1 unit as significant  amount of time just waiting for pt to use toielt)                     Kari [name], PT Acute Rehab Services Pager [phone] [facility] [phone] [facility] [phone]    Kari [name] 05/10/2019, 4:52 PM

## 2019-05-10 NOTE — Consult Note (Addendum)
NAME:  Kari [name], MRN:  [id number], DOB:  [birth year], LOS: 4 ADMISSION DATE:  05/05/2019, CONSULTATION DATE:  05/09/19 REFERRING MD:  Erlinda Hong - TRH, CHIEF COMPLAINT:  Tachypnea   Brief History   86 yo F hx COPD who was admitted for SOB in setting of CHF exacerbation with pulmonary congestion. Following diuretic therapy, oxygenation has improved but patient continues to endorse feelings of tachypnea. Pt/family request pulmonary consult.   History of present illness   86 yo F PMH COPD, possible sarcoid, CKD III, chronic diastolic heart failure, hypothyroidism, Afib, chronic lymphedema who presented to ED from [facility] 05/06/19 with SOB and worsening BLE edema. Patient states onset has been gradual, SOB is does on exertion and when laying flat. Denies cough, wheeze. Denies chest pain, palpitations. No n/v/d, no known sick contacts.    Past Medical History  AI Chronic diastolic heart failure COPD Chronic kidney disease stg III Diverticulosis  GERD H. Pylori gastritis  Hiatal hernia HTN  Gallstones GERD MI Osteoporosis  Paroxysmal atrial fibrillation Pulmonary and dermatologic sarcoid  Pulmonary hypertension Hypothyroidism   Significant Hospital Events   4/12 admitted to hospitalist service with suspected CHF exacerbation and diuresed, fluid restriction implemented. ECHO with decrease in LVEF from 65% (2019) to 55%  4/13  Consults:  Cardiology PCCM  Procedures:   Significant Diagnostic Tests:  ECHO 4/12> LVEF 55-60%. Mild LVH. Indeterminate LV diastolic parameters. Normal RV systolic function. Moderately elevated pulmonary artery systolic pressure. Estimated RVSP 46.9 mmHg. Moderately dilated LA and RA. Myxomatous mitral valve with mild MVR, and without mitral stenosis. Aortic leaflet movement is restricted and appears mildly stenosed however mean gradient consistent with aortic valve sclerosis. Moderate aortic regurg. Mild dilation of ascending aorta, 73mm.   Micro  Data:  4/12 SARS Cov2> neg   Antimicrobials:    Interim history/subjective:  NAEO Reclined in bed eating breakfast, watching TV in NAD and with no pursed lip breathing. During conversation some mild pursed lip breathing when asked if she is feeling better, worse, or the same from yesterday.   Objective   Blood pressure 137/79, pulse 67, temperature 98.1 F (36.7 C), temperature source Oral, resp. rate 18, height 5\' 4"  (1.626 m), weight 90.8 kg, SpO2 98 %.    FiO2 (%):  [28 %] 28 %   Intake/Output Summary (Last 24 hours) at 05/10/2019 0759 Last data filed at 05/10/2019 0050 Gross per 24 hour  Intake 960 ml  Output 500 ml  Net 460 ml   Filed Weights   05/08/19 0601 05/09/19 0517 05/10/19 0431  Weight: 90.9 kg 90.7 kg 90.8 kg    Examination: General: Chronically ill, obese, older adult F, reclined in bed NAD HENT: NCAT. Pink mmm trachea midline anicteric sclera,. Lungs: CTA. Mildly diminished bibasilar sounds. No nasal flaring, no scalene or trapezius muscle recruitment. Intermittent pursed lips. SpO2 98% on [facility] Cardiovascular: irir s1s2. No rgm.  Abdomen: obese, soft, round, ndnt. + bowel sounds  Extremities: Trace non-pitting pedal edema. BLE with symmetrical adiposity vs lymphedema. No cyanosis or clubbing  Neuro: Awake alert Oriented x3. Following commands GU: defer   Resolved Hospital Problem list     Assessment & Plan:   Acute on chronic respiratory failure with hypoxia  -COPD on home 2LNC  -Hx of possible pulmonary sarcoid -admitted with SOB, pulm congestion in setting of CHF exacerbation, however has intermittently had features of AECOPD with wheezing and has received steroids as well as diuretics -Remains intermittently dyspneic without worsening hypoxia  P  -Continue  IS, continue duonebs. -Would dc on steroid taper (wheezing resolved on my exam 4/15) as well as optimized diuretic therapy  -Cont home O2-- we actually have room to wean (88-92%) however  decreasing O2 causes pt significant anxiety so is fine to leave at home [facility] -Imperative that the symptoms of SOB be addressed. She is open to adding palliative medications to her care per our discussion. I see many allergies in med list, but klonopin appears well tolerated -- this may be a reasonable starting point & I have ordered QID PRN low-dose clonazepam. -Agree with palliative consult and will appreciate  input on this as well.  -DNR   Acute on chronic diastolic heart failure -volume status appears fairly optimized at this time and pulm exam without crackles  P -appreciate cardiology recs for discharge diuretics   Atrial fibrillation -previously on metop, thoughtfully discontinued by cards due to COPD with possible AE. Remains in acceptable rate off of BB P -Continue eliquis -If rate control needed, agree with considering cardizem   CKD III  P -trend renal indices  AS/AI P -outpatient follow up with primary cardiologist   Deconditioning P -PT  -Palliative care consulted   Best practice:  Diet: fluid restriction  Pain/Anxiety/Delirium protocol (if indicated): PRN clonazepam  VAP protocol (if indicated): na DVT prophylaxis: eliquis  GI prophylaxis: na Glucose control: monitoring  Mobility: PT Code Status: DNR  Family Communication: discussed directly with patient and with daughter Marcelino Duster. Family is very open to considering palliative options.  Disposition: cardiac tele with dispo goal back to prior SNF   Labs   CBC: Recent Labs  Lab 05/05/19 1920 05/06/19 0959 05/07/19 0455 05/08/19 0459  WBC 8.0 7.9 7.0 7.6  NEUTROABS  --  6.1  --  7.0  HGB 10.5* 9.6* 10.0* 10.2*  HCT 34.2* 31.4* 32.6* 32.5*  MCV 97.7 98.7 97.0 95.6  PLT 288 251 260 271    Basic Metabolic Panel: Recent Labs  Lab 05/05/19 1920 05/05/19 2211 05/06/19 0959 05/07/19 0455 05/08/19 0459 05/09/19 0522 05/10/19 0448  NA   < >  --  143 141 141 141 142  K   < >  --  3.3* 3.8 3.5  3.6 4.4  CL   < >  --  89* 86* 86* 86* 89*  CO2   < >  --  42* 42* 41* 42* 38*  GLUCOSE   < >  --  103* 172* 148* 151* 142*  BUN   < >  --  42* 49* 58* 56* 48*  CREATININE   < >  --  1.94* 1.53* 1.50* 1.42* 1.20*  CALCIUM   < >  --  8.3* 8.7* 9.1 9.1 9.6  MG  --  1.8 1.9  --  2.2 2.2  --    < > = values in this interval not displayed.   GFR: Estimated Creatinine Clearance: 34.7 mL/min (A) (by C-G formula based on SCr of 1.2 mg/dL (H)). Recent Labs  Lab 05/05/19 1920 05/06/19 0959 05/07/19 0455 05/08/19 0459  WBC 8.0 7.9 7.0 7.6    Liver Function Tests: Recent Labs  Lab 05/06/19 0959  AST 15  ALT 11  ALKPHOS 64  BILITOT 0.9  PROT 6.6  ALBUMIN 3.6   No results for input(s): LIPASE, AMYLASE in the last 168 hours. No results for input(s): AMMONIA in the last 168 hours.  ABG    Component Value Date/Time   PHART 7.383 03/04/2013 1010   PCO2ART 42.9 03/04/2013  1010   PO2ART 73.7 (L) 03/04/2013 1010   HCO3 25.0 (H) 03/04/2013 1010   TCO2 26.3 03/04/2013 1010   O2SAT 95.1 03/04/2013 1010     Coagulation Profile: Recent Labs  Lab 05/05/19 2211  INR 1.3*    Cardiac Enzymes: No results for input(s): CKTOTAL, CKMB, CKMBINDEX, TROPONINI in the last 168 hours.  HbA1C: No results found for: HGBA1C  CBG: No results for input(s): GLUCAP in the last 168 hours.  Review of Systems:   As per HPI  Past Medical History  She,  has a past medical history of Abdominal pain, Arthritis, Asthma, Chronic diastolic CHF (congestive heart failure) (HCC), Chronic respiratory failure (HCC), Complication of anesthesia, COPD (chronic obstructive pulmonary disease) (HCC), COVID-19 virus infection (01/2019), Dilated aortic root (HCC), Diverticulosis, GERD (gastroesophageal reflux disease), Helicobacter pylori gastritis (11/2011), Hiatal hernia, History of echocardiogram, History of gallstones, History of scarlet fever, MI, old, Mild AI (aortic insufficiency), Mild aortic stenosis, Mitral  regurgitation, Osteoporosis, PAF (paroxysmal atrial fibrillation) (HCC), Premature atrial contractions, Pulmonary hypertension (HCC), Sarcoid, Sarcoidosis, lung (HCC), Thyroid disease, and Zenker's diverticulum.   Surgical History    Past Surgical History:  Procedure Laterality Date  . ABDOMINAL HYSTERECTOMY  10/1997  . cardiolite  2000  . CARDIOVERSION N/A 02/29/2016   Procedure: CARDIOVERSION;  Surgeon: Jake BatheMark C Skains, MD;  [facility];  Service: Cardiovascular;  Laterality: N/A;  . CARDIOVERSION N/A 10/19/2016   Procedure: CARDIOVERSION;  Surgeon: Chilton Siandolph, Tiffany, MD;  [facility];  Service: Cardiovascular;  Laterality: N/A;  . CATARACT EXTRACTION Bilateral   . CHOLECYSTECTOMY  04/1996  . COLONOSCOPY    . EP IMPLANTABLE DEVICE N/A 07/23/2014   Procedure: Loop Recorder Insertion;  Surgeon: Hillis RangeJames Allred, MD;  Location: [facility] INVASIVE CV LAB;  Service: Cardiovascular;  Laterality: N/A;  . EYE SURGERY Right 11/2015  . HERNIA REPAIR     inguinal and hiatal/abdominal  . LEG SURGERY     right leg...rod/screws  . THYROIDECTOMY       Social History   reports that she quit smoking about 51 years ago. Her smoking use included cigarettes and pipe. She has a 20.00 pack-year smoking history. She has never used smokeless tobacco. She reports current alcohol use. She reports that she does not use drugs.   Family History   Her family history includes Diabetes in her brother, daughter, and father; Heart disease in her mother; Other in her sister; Rheumatic fever in her sister. There is no history of Colon cancer.   Allergies Allergies  Allergen Reactions  . Advair Diskus [Fluticasone-Salmeterol] Other (See Comments)    Severe muscle pain, Couldn't walk at all  . Aspirin Shortness Of Breath, Nausea Only, Swelling and Other (See Comments)    ONLY IN HIGH DOSES- stomach pain, also  . Dulera [Mometasone Furo-Formoterol Fum] Other (See Comments)    Caused A-FIB  . Ivp Dye  [Iodinated Diagnostic Agents]     Pt states she thinks she had BP drop-unsure---Pre Treatment pt states she does fine  . Losartan Potassium Shortness Of Breath and Swelling  . Propofol Other (See Comments)    Severely prolonged altered mental status and increased dementia   . Amitriptyline Other (See Comments)    Couldn't sleep  . Budesonide-Formoterol Fumarate Other (See Comments)    Leg cramps and Visual deficit  . Codeine Nausea And Vomiting, Palpitations and Other (See Comments)    Causes arrhythmia(s) and "goes crazy"  . Ketorolac Tromethamine Swelling and Other (See Comments)    Angioedema   .  Oxycodone-Acetaminophen Other (See Comments)    Confusion  . Sulfasalazine Other (See Comments)    Unknown reaction  . Acetaminophen Other (See Comments)    Reaction ??  . Alprazolam Other (See Comments)    "Allergic," per MAR  . Amitriptyline Hcl Other (See Comments)    "Could not sleep"- "allergic," per MAR  . Amlodipine Besylate Swelling  . Azithromycin Other (See Comments)    Irreg pulse  . Fluticasone Furoate-Vilanterol Other (See Comments)    Severe eye issues and saw flashing lights  . Iodine Other (See Comments)    "Allergic," per MAR, possible hypotenison  . Morphine And Related Hives and Other (See Comments)    Caused arrhythmias and "goes crazy"  . Other Hives and Other (See Comments)    Opioids - Morphine Analogues- Narcotics - reverse effect on her, couldn't sleep, arrhythmias  . Oxycodone Hcl Other (See Comments)    Confusion   . Prevacid [Lansoprazole] Other (See Comments)    Unknown   . Tramadol Hives  . Morphine Palpitations and Other (See Comments)    Tachycardia     Home Medications  Prior to Admission medications   Medication Sig Start Date End Date Taking? Authorizing Provider  acetaminophen (TYLENOL) 325 MG tablet Take 650 mg by mouth every 4 (four) hours as needed (for pain or elevated temperature).   Yes [provider]  albuterol  (PROVENTIL HFA;VENTOLIN HFA) 108 (90 BASE) MCG/ACT inhaler Inhale 2 puffs into the lungs every 4 (four) hours as needed for shortness of breath.    Yes [provider]  Aromatic Inhalants (VICKS BABYRUB) OINT Apply 1 application topically every 8 (eight) hours as needed (for cough and/or congestion).   Yes [provider]  bisacodyl (DULCOLAX) 10 MG suppository Place 10 mg rectally daily as needed (for constipation).   Yes [provider]  bumetanide (BUMEX) 2 MG tablet Take 2 mg by mouth See admin instructions. Take 2 mg by mouth three times a day (30 minutes after Metolazone)   Yes [provider]  Carboxymethylcell-Glycerin PF (REFRESH OPTIVE PF) 0.5-0.9 % SOLN Place 1 drop into both eyes every 8 (eight) hours as needed (for dry eyes).   Yes [provider]  clonazePAM (KLONOPIN) 0.5 MG tablet Take 0.25 mg by mouth 3 (three) times daily.   Yes [provider]  Dextran 70-Hypromellose (ARTIFICIAL TEARS PF OP) Place 1 drop into both eyes every 8 (eight) hours as needed (for dry eyes).   Yes [provider]  ELIQUIS 5 MG TABS tablet TAKE 1 TABLET (5 MG TOTAL) BY MOUTH 2 (TWO) TIMES DAILY. Patient taking differently: Take 5 mg by mouth 2 (two) times daily.  10/23/17  Yes Lars Masson, MD  ferrous sulfate 325 (65 FE) MG tablet Take 325 mg by mouth daily with breakfast.   Yes [provider]  guaiFENesin (MUCINEX) 600 MG 12 hr tablet Take 600 mg by mouth 2 (two) times daily.   Yes [provider]  guaifenesin (ROBITUSSIN) 100 MG/5ML syrup Take 200 mg by mouth every 4 (four) hours as needed for cough.   Yes [provider]  hydrALAZINE (APRESOLINE) 10 MG tablet Take 20 mg by mouth See admin instructions. Take 20 mg (2 tablets) by mouth two times a day and hold for a a B/P <110/60   Yes [provider]  ipratropium-albuterol (DUONEB) 0.5-2.5 (3) MG/3ML SOLN Take 3 mLs by nebulization every 6 (six) hours.    Yes [provider]  levothyroxine (SYNTHROID) 175 MCG tablet Take 175 mcg by mouth daily before breakfast.   Yes [provider]  magnesium hydroxide (MILK OF MAGNESIA) 400 MG/5ML suspension Take 30 mLs by mouth daily as needed for mild constipation.   Yes [provider]  memantine (NAMENDA) 10 MG tablet Take 10 mg by mouth 2 (two) times daily.   Yes [provider]  metolazone (ZAROXOLYN) 5 MG tablet Take 5 mg by mouth 2 (two) times daily.   Yes [provider]  metoprolol succinate (TOPROL-XL) 50 MG 24 hr tablet Take 50 mg by mouth daily. Take with or immediately following a meal.   Yes [provider]  omeprazole (PRILOSEC) 20 MG capsule Take 20 mg by mouth in the morning.   Yes [provider]  ondansetron (ZOFRAN) 4 MG tablet Take 4 mg by mouth every 8 (eight) hours as needed for nausea or vomiting.   Yes [provider]  polyethylene glycol powder (GLYCOLAX/MIRALAX) 17 GM/SCOOP powder Take 17 g by mouth See admin instructions. Mix 17 grams into 4-6 ounces of liquid and drink once a day   Yes [provider]  potassium chloride SA (KLOR-CON) 20 MEQ tablet Take 40 mEq by mouth 2 (two) times daily.   Yes [provider]  rOPINIRole (REQUIP) 3 MG tablet Take 3 mg by mouth at bedtime.   Yes [provider]  Skin Protectants, Misc. (EUCERIN) cream Apply 1 application topically See admin instructions. Apply to both legs 3 times a day   Yes [provider]  losartan (COZAAR) 50 MG tablet Take 1 tablet (50 mg total) by mouth daily. Patient not taking: Reported on 05/06/2019 01/19/17 05/06/19  [name], MD  spironolactone (ALDACTONE) 25 MG tablet Take 0.5 tablets (12.5 mg total) by mouth daily. Patient not taking: Reported on 05/06/2019 02/27/17 05/06/19  Lars Masson, MD     Tessie Fass MSN, AGACNP-BC [facility] [phone] If no answer, [phone]  05/10/2019, 9:09 AM

## 2019-05-10 NOTE — Progress Notes (Signed)
Community education officer received referral for pt to participate in [facility] palliative program once pt discharges. Called pt's Daughter,Milnette, to confirm interest and explain palliative services.   Liaison to follow while in hospital to alert [facility] palliative team who will then outreach family/SNF to arrange Palliative admission visit.     Please do not hesitate to call with any questions and thank you for the referral.     Trena Platt, RN  [facility] Liaison   [phone]

## 2019-05-15 NOTE — Progress Notes (Deleted)
Cardiology Office Note    Date:  05/15/2019   ID:  Kari [name], DOB July 08, 1928, MRN [id number]  PCP:  Algernon Huxley, MD  Cardiologist: Ena Dawley, MD EPS: Thompson Grayer, MD  No chief complaint on file.   History of Present Illness:  Kari [name] is a 86 y.o. female with history of persistent atrial fibrillation on toprol and eliquis chronic diastolic CHF, pulmonary hypertension moderate to severe MR COPD on home O2, sarcoid  Patient was hospitalized 04/2019 with COPD exacerbation and acute on chronic diastolic CHF diuresed with Lasix 2D echo 04/2019 normal LVEF moderate pulmonary hypertension mild MR, mild left ear, moderate aortic insufficiency  Past Medical History:  Diagnosis Date  . Abdominal pain   . Arthritis   . Asthma   . Chronic diastolic CHF (congestive heart failure) ([place])   . Chronic respiratory failure (HCC)    a. on 2L home O2 chronically.  . Complication of anesthesia    BP drops  . COPD (chronic obstructive pulmonary disease) ([place])   . COVID-19 virus infection 01/2019  . Dilated aortic root ([place])   . Diverticulosis   . GERD (gastroesophageal reflux disease)   . Helicobacter pylori gastritis 11/2011  . Hiatal hernia   . History of echocardiogram    Echo 3/17: Mild LVH, EF 55-60%, mild aortic stenosis (peak 10 mmHg), mild MR, mild LAE  . History of gallstones   . History of scarlet fever   . MI, old   . Mild AI (aortic insufficiency)   . Mild aortic stenosis   . Mitral regurgitation   . Osteoporosis   . PAF (paroxysmal atrial fibrillation) ([place])   . Premature atrial contractions   . Pulmonary hypertension ([place])   . Sarcoid    pulmonary and dermatologic  . Sarcoidosis, lung ([place])   . Thyroid disease   . Zenker's diverticulum     Past Surgical History:  Procedure Laterality Date  . ABDOMINAL HYSTERECTOMY  10/1997  . cardiolite  2000  . CARDIOVERSION N/A 02/29/2016   Procedure: CARDIOVERSION;  Surgeon: Jerline Pain, MD;  Location: [facility];  Service: Cardiovascular;  Laterality: N/A;  . CARDIOVERSION N/A 10/19/2016   Procedure: CARDIOVERSION;  Surgeon: Skeet Latch, MD;  Location: [facility];  Service: Cardiovascular;  Laterality: N/A;  . CATARACT EXTRACTION Bilateral   . CHOLECYSTECTOMY  04/1996  . COLONOSCOPY    . EP IMPLANTABLE DEVICE N/A 07/23/2014   Procedure: Loop Recorder Insertion;  Surgeon: Thompson Grayer, MD;  Location: [facility];  Service: Cardiovascular;  Laterality: N/A;  . EYE SURGERY Right 11/2015  . HERNIA REPAIR     inguinal and hiatal/abdominal  . LEG SURGERY     right leg...rod/screws  . THYROIDECTOMY      Current Medications: No outpatient medications have been marked as taking for the 05/21/19 encounter (Appointment) with Imogene Burn, PA-C.     Allergies:   Advair diskus [fluticasone-salmeterol], Aspirin, Dulera [mometasone furo-formoterol fum], Ivp dye [iodinated diagnostic agents], Losartan potassium, Propofol, Amitriptyline, Budesonide-formoterol fumarate, Codeine, Ketorolac tromethamine, Oxycodone-acetaminophen, Sulfasalazine, Acetaminophen, Alprazolam, Amitriptyline hcl, Amlodipine besylate, Azithromycin, Fluticasone furoate-vilanterol, Iodine, Morphine and related, Other, Oxycodone hcl, Prevacid [lansoprazole], Tramadol, and Morphine   Social History   Socioeconomic History  . Marital status: Divorced    Spouse name: Not on file  . Number of children: 4  . Years of education: Not on file  . Highest education level: Not on file  Occupational History  . Occupation: clergy  Tobacco Use  . Smoking  status: Former Smoker    Packs/day: 2.00    Years: 10.00    Pack years: 20.00    Types: Cigarettes, Pipe    Quit date: 01/25/1968    Years since quitting: 51.3  . Smokeless tobacco: Never Used  . Tobacco comment: pt unsure of exact month in 1977  Substance and Sexual Activity  . Alcohol use: Yes    Alcohol/week: 0.0 standard drinks    Comment: seldom...glass wine  .  Drug use: No  . Sexual activity: Not on file  Other Topics Concern  . Not on file  Social History Narrative   ** Merged History Encounter **       Social Determinants of Health   Financial Resource Strain:   . Difficulty of Paying Living Expenses:   Food Insecurity:   . Worried About Programme researcher, broadcasting/film/video in the Last Year:   . Barista in the Last Year:   Transportation Needs:   . Freight forwarder (Medical):   Marland Kitchen Lack of Transportation (Non-Medical):   Physical Activity:   . Days of Exercise per Week:   . Minutes of Exercise per Session:   Stress:   . Feeling of Stress :   Social Connections:   . Frequency of Communication with Friends and Family:   . Frequency of Social Gatherings with Friends and Family:   . Attends Religious Services:   . Active Member of Clubs or Organizations:   . Attends Banker Meetings:   Marland Kitchen Marital Status:      Family History:  The patient's ***family history includes Diabetes in her brother, daughter, and father; Heart disease in her mother; Other in her sister; Rheumatic fever in her sister.   ROS:   Please see the history of present illness.    ROS All other systems reviewed and are negative.   PHYSICAL EXAM:   VS:  There were no vitals taken for this visit.  Physical Exam  GEN: Well nourished, well developed, in no acute distress  HEENT: normal  Neck: no JVD, carotid bruits, or masses Cardiac:RRR; no murmurs, rubs, or gallops  Respiratory:  clear to auscultation bilaterally, normal work of breathing GI: soft, nontender, nondistended, + BS Ext: without cyanosis, clubbing, or edema, Good distal pulses bilaterally MS: no deformity or atrophy  Skin: warm and dry, no rash Neuro:  Alert and Oriented x 3, Strength and sensation are intact Psych: euthymic mood, full affect  Wt Readings from Last 3 Encounters:  05/10/19 200 lb 3.2 oz (90.8 kg)  10/19/16 179 lb (81.2 kg)  02/29/16 177 lb (80.3 kg)      Studies/Labs  Reviewed:   EKG:  EKG is*** ordered today.  The ekg ordered today demonstrates ***  Recent Labs: 05/05/2019: B Natriuretic Peptide 363.0 05/06/2019: ALT 11 05/08/2019: Hemoglobin 10.2; Platelets 271 05/09/2019: Magnesium 2.2 05/10/2019: BUN 48; Creatinine, Ser 1.20; Potassium 4.4; Sodium 142   Lipid Panel    Component Value Date/Time   TRIG 62 02/28/2013 0132    Additional studies/ records that were reviewed today include:  2D echo 05/06/2019  IMPRESSIONS     1. Left ventricular ejection fraction, by estimation, is 55 to 60%. The  left ventricle has normal function. The left ventricle has no regional  wall motion abnormalities. There is mild left ventricular hypertrophy.  Left ventricular diastolic parameters  are indeterminate.   2. Right ventricular systolic function is normal. The right ventricular  size is normal. There is moderately elevated  pulmonary artery systolic  pressure.   3. Left atrial size was moderately dilated.   4. Right atrial size was moderately dilated.   5. The mitral valve is myxomatous. Mild mitral valve regurgitation. No  evidence of mitral stenosis.   6. Mean gradient consistent with aortic valve sclerosis. However, leaflet  movement is restricted and there appears to be at least mild aortic  stenosis. The left and right coronary cusps are nearly fixed. The aortic  valve is abnormal. Aortic valve  regurgitation is moderate. Mild to moderate aortic valve  sclerosis/calcification is present, without any evidence of aortic  stenosis. Aortic regurgitation PHT measures 319 msec.   7. Aortic dilatation noted. There is mild dilatation of the ascending  aorta measuring 40 mm.   8. The inferior vena cava is normal in size with <50% respiratory  variability, suggesting right atrial pressure of 8 mmHg.    ASSESSMENT:    No diagnosis found.   PLAN:  In order of problems listed above:  Persistent atrial fibrillation on Eliquis and metoprolol  Chronic  diastolic CHF 2D echo 05/06/2019 LVEF 55 to 60% indeterminant diastolic parameters, moderate elevated pulmonary artery systolic pressure, mild MR, mild aortic stenosis and moderate aortic regurgitation  Pulmonary hypertension      Medication Adjustments/Labs and Tests Ordered: Current medicines are reviewed at length with the patient today.  Concerns regarding medicines are outlined above.  Medication changes, Labs and Tests ordered today are listed in the Patient Instructions below. There are no Patient Instructions on file for this visit.   Signed, Jacolyn Reedy, PA-C  05/15/2019 2:52 PM    [facility] [address] [place], [place], [place]  [address] Phone: [phone]; Fax: [phone]

## 2019-05-21 ENCOUNTER — Ambulatory Visit: Payer: Medicare Other | Admitting: Physician Assistant

## 2019-05-21 NOTE — Telephone Encounter (Signed)
Pt was scheduled for appt 05/31/19 w/Dr. Wynona Neat.  Pt canceled appt 4/27 and didn't want to resch at this time.  Verified w/Kim that cancelled the appt w/pt.  ta

## 2019-05-31 ENCOUNTER — Institutional Professional Consult (permissible substitution): Payer: Medicaid Other | Admitting: Pulmonary Disease

## 2019-06-25 DEATH — deceased
